# Patient Record
Sex: Female | Born: 1955
Health system: Southern US, Community
[De-identification: ages and names within clinical notes are randomized; demographics above are authoritative.]

## PROBLEM LIST (undated history)

## (undated) DIAGNOSIS — J988 Other specified respiratory disorders: Secondary | ICD-10-CM

## (undated) DIAGNOSIS — R0602 Shortness of breath: Secondary | ICD-10-CM

## (undated) DIAGNOSIS — Z9289 Personal history of other medical treatment: Secondary | ICD-10-CM

## (undated) DIAGNOSIS — IMO0001 Reserved for inherently not codable concepts without codable children: Secondary | ICD-10-CM

## (undated) HISTORY — DX: Reserved for inherently not codable concepts without codable children: IMO0001

## (undated) HISTORY — DX: Personal history of other medical treatment: Z92.89

## (undated) HISTORY — DX: Other specified respiratory disorders: J98.8

---
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
## 1999-02-22 ENCOUNTER — Other Ambulatory Visit: Admission: RE | Admit: 1999-02-22 | Discharge: 1999-02-22 | Payer: Self-pay | Admitting: *Deleted

## 1999-06-27 ENCOUNTER — Encounter: Admission: RE | Admit: 1999-06-27 | Discharge: 1999-08-14 | Payer: Self-pay | Admitting: Orthopedic Surgery

## 2001-05-28 HISTORY — PX: KNEE ARTHROSCOPY: SUR90

## 2001-09-04 ENCOUNTER — Other Ambulatory Visit: Admission: RE | Admit: 2001-09-04 | Discharge: 2001-09-04 | Payer: Self-pay | Admitting: *Deleted

## 2002-02-02 ENCOUNTER — Ambulatory Visit (HOSPITAL_BASED_OUTPATIENT_CLINIC_OR_DEPARTMENT_OTHER): Admission: RE | Admit: 2002-02-02 | Discharge: 2002-02-03 | Payer: Self-pay | Admitting: Orthopedic Surgery

## 2002-07-22 ENCOUNTER — Encounter: Admission: RE | Admit: 2002-07-22 | Discharge: 2002-07-22 | Payer: Self-pay | Admitting: Internal Medicine

## 2002-07-22 ENCOUNTER — Encounter: Payer: Self-pay | Admitting: Internal Medicine

## 2003-08-30 ENCOUNTER — Other Ambulatory Visit: Admission: RE | Admit: 2003-08-30 | Discharge: 2003-08-30 | Payer: Self-pay | Admitting: *Deleted

## 2004-05-28 HISTORY — PX: OTHER SURGICAL HISTORY: SHX169

## 2005-03-02 ENCOUNTER — Ambulatory Visit: Payer: Self-pay | Admitting: Internal Medicine

## 2005-03-09 ENCOUNTER — Ambulatory Visit: Payer: Self-pay

## 2005-03-15 ENCOUNTER — Ambulatory Visit: Payer: Self-pay | Admitting: Internal Medicine

## 2005-04-26 ENCOUNTER — Encounter: Admission: RE | Admit: 2005-04-26 | Discharge: 2005-04-26 | Payer: Self-pay | Admitting: *Deleted

## 2006-03-04 ENCOUNTER — Encounter: Admission: RE | Admit: 2006-03-04 | Discharge: 2006-03-04 | Payer: Self-pay | Admitting: Internal Medicine

## 2006-03-04 ENCOUNTER — Ambulatory Visit: Payer: Self-pay | Admitting: Internal Medicine

## 2006-04-11 ENCOUNTER — Ambulatory Visit: Payer: Self-pay | Admitting: Internal Medicine

## 2006-04-11 LAB — CONVERTED CEMR LAB
ALT: 23 units/L (ref 0–40)
AST: 25 units/L (ref 0–37)
Albumin: 4 g/dL (ref 3.5–5.2)
Alkaline Phosphatase: 54 units/L (ref 39–117)
BUN: 13 mg/dL (ref 6–23)
Basophils Absolute: 0.1 10*3/uL (ref 0.0–0.1)
Basophils Relative: 1 % (ref 0.0–1.0)
Bilirubin, Direct: 0.1 mg/dL (ref 0.0–0.3)
CO2: 31 meq/L (ref 19–32)
Calcium: 9.9 mg/dL (ref 8.4–10.5)
Chloride: 105 meq/L (ref 96–112)
Creatinine, Ser: 0.8 mg/dL (ref 0.4–1.2)
Eosinophil percent: 5.9 % — ABNORMAL HIGH (ref 0.0–5.0)
Free T4: 0.8 ng/dL — ABNORMAL LOW (ref 0.9–1.8)
GFR calc non Af Amer: 81 mL/min
Glomerular Filtration Rate, Af Am: 98 mL/min/{1.73_m2}
Glucose, Bld: 77 mg/dL (ref 70–99)
HCT: 41.1 % (ref 36.0–46.0)
Hemoglobin: 13.9 g/dL (ref 12.0–15.0)
Lymphocytes Relative: 33 % (ref 12.0–46.0)
MCHC: 33.7 g/dL (ref 30.0–36.0)
MCV: 92.3 fL (ref 78.0–100.0)
Monocytes Absolute: 0.5 10*3/uL (ref 0.2–0.7)
Monocytes Relative: 8.3 % (ref 3.0–11.0)
Neutro Abs: 2.9 10*3/uL (ref 1.4–7.7)
Neutrophils Relative %: 51.8 % (ref 43.0–77.0)
Platelets: 262 10*3/uL (ref 150–400)
Potassium: 4.3 meq/L (ref 3.5–5.1)
RBC: 4.46 M/uL (ref 3.87–5.11)
RDW: 13 % (ref 11.5–14.6)
Sodium: 144 meq/L (ref 135–145)
T3, Free: 3 pg/mL (ref 2.3–4.2)
TSH: 1.21 microintl units/mL (ref 0.35–5.50)
Total Bilirubin: 0.4 mg/dL (ref 0.3–1.2)
Total Protein: 7 g/dL (ref 6.0–8.3)
WBC: 5.7 10*3/uL (ref 4.5–10.5)

## 2006-05-09 ENCOUNTER — Ambulatory Visit: Payer: Self-pay | Admitting: Internal Medicine

## 2006-05-22 ENCOUNTER — Encounter: Admission: RE | Admit: 2006-05-22 | Discharge: 2006-05-22 | Payer: Self-pay | Admitting: Internal Medicine

## 2006-06-14 ENCOUNTER — Ambulatory Visit: Payer: Self-pay | Admitting: Internal Medicine

## 2006-06-14 LAB — CONVERTED CEMR LAB
ALT: 22 units/L (ref 0–40)
AST: 31 units/L (ref 0–37)
Basophils Relative: 1 % (ref 0.0–1.0)
Calcium: 9.5 mg/dL (ref 8.4–10.5)
Chloride: 109 meq/L (ref 96–112)
Cholesterol: 191 mg/dL (ref 0–200)
Creatinine, Ser: 0.8 mg/dL (ref 0.4–1.2)
Eosinophils Relative: 6.9 % — ABNORMAL HIGH (ref 0.0–5.0)
Glucose, Bld: 90 mg/dL (ref 70–99)
HCT: 41.7 % (ref 36.0–46.0)
HDL: 85.7 mg/dL (ref >39.0)
LDL Cholesterol: 96 mg/dL (ref 0–99)
Lymphocytes Relative: 34.7 % (ref 12.0–46.0)
MCHC: 32.9 g/dL (ref 30.0–36.0)
MCV: 91.9 fL (ref 78.0–100.0)
Monocytes Absolute: 0.4 10*3/uL (ref 0.2–0.7)
TSH: 1.17 microintl units/mL (ref 0.35–5.50)
Triglycerides: 49 mg/dL (ref 0–149)
WBC: 4.5 10*3/uL (ref 4.5–10.5)

## 2006-06-25 ENCOUNTER — Ambulatory Visit: Payer: Self-pay | Admitting: Internal Medicine

## 2006-07-11 ENCOUNTER — Ambulatory Visit: Payer: Self-pay | Admitting: Internal Medicine

## 2006-07-25 ENCOUNTER — Ambulatory Visit: Payer: Self-pay | Admitting: Internal Medicine

## 2006-08-07 ENCOUNTER — Ambulatory Visit: Payer: Self-pay | Admitting: Internal Medicine

## 2007-08-01 DIAGNOSIS — J069 Acute upper respiratory infection, unspecified: Secondary | ICD-10-CM | POA: Insufficient documentation

## 2007-08-06 DIAGNOSIS — L500 Allergic urticaria: Secondary | ICD-10-CM

## 2007-08-06 HISTORY — DX: Allergic urticaria: L50.0

## 2007-08-07 ENCOUNTER — Ambulatory Visit: Payer: Self-pay | Admitting: Family Medicine

## 2007-08-20 ENCOUNTER — Encounter: Admission: RE | Admit: 2007-08-20 | Discharge: 2007-08-20 | Payer: Self-pay | Admitting: Obstetrics and Gynecology

## 2007-10-03 ENCOUNTER — Telehealth: Payer: Self-pay | Admitting: Internal Medicine

## 2007-10-04 ENCOUNTER — Ambulatory Visit: Payer: Self-pay | Admitting: Family Medicine

## 2007-10-10 ENCOUNTER — Encounter: Admission: RE | Admit: 2007-10-10 | Discharge: 2007-11-14 | Payer: Self-pay | Admitting: Orthopaedic Surgery

## 2007-10-13 ENCOUNTER — Ambulatory Visit: Payer: Self-pay | Admitting: Internal Medicine

## 2007-10-13 ENCOUNTER — Telehealth: Payer: Self-pay | Admitting: *Deleted

## 2007-10-13 DIAGNOSIS — R42 Dizziness and giddiness: Secondary | ICD-10-CM | POA: Insufficient documentation

## 2007-10-13 DIAGNOSIS — M546 Pain in thoracic spine: Secondary | ICD-10-CM | POA: Insufficient documentation

## 2007-10-13 LAB — CONVERTED CEMR LAB
Ketones, urine, test strip: NEGATIVE
Nitrite: NEGATIVE
Protein, U semiquant: NEGATIVE
Urobilinogen, UA: 0.2

## 2007-10-17 LAB — CONVERTED CEMR LAB
ALT: 19 units/L (ref 0–35)
AST: 25 units/L (ref 0–37)
Albumin: 4.1 g/dL (ref 3.5–5.2)
BUN: 17 mg/dL (ref 6–23)
Basophils Absolute: 0 10*3/uL (ref 0.0–0.1)
Basophils Relative: 0.3 % (ref 0.0–1.0)
CO2: 30 meq/L (ref 19–32)
Calcium: 9.5 mg/dL (ref 8.4–10.5)
Chloride: 110 meq/L (ref 96–112)
Creatinine, Ser: 0.6 mg/dL (ref 0.4–1.2)
Eosinophils Absolute: 0.3 10*3/uL (ref 0.0–0.7)
Eosinophils Relative: 4.6 % (ref 0.0–5.0)
Free T4: 1 ng/dL (ref 0.6–1.6)
HDL: 74.8 mg/dL (ref >39.0)
LDL Cholesterol: 103 mg/dL — ABNORMAL HIGH (ref 0–99)
Lymphocytes Relative: 25.6 % (ref 12.0–46.0)
MCHC: 33.8 g/dL (ref 30.0–36.0)
MCV: 91.2 fL (ref 78.0–100.0)
Neutro Abs: 3.7 10*3/uL (ref 1.4–7.7)
Neutrophils Relative %: 65.1 % (ref 43.0–77.0)
TSH: 0.61 microintl units/mL (ref 0.35–5.50)
Total Bilirubin: 0.7 mg/dL (ref 0.3–1.2)
Total CHOL/HDL Ratio: 2.5
Triglycerides: 50 mg/dL (ref 0–149)
VLDL: 10 mg/dL (ref 0–40)

## 2008-01-07 ENCOUNTER — Encounter: Payer: Self-pay | Admitting: Internal Medicine

## 2008-02-25 ENCOUNTER — Encounter: Payer: Self-pay | Admitting: Internal Medicine

## 2008-03-08 ENCOUNTER — Encounter: Payer: Self-pay | Admitting: Internal Medicine

## 2008-05-26 ENCOUNTER — Ambulatory Visit: Payer: Self-pay | Admitting: Internal Medicine

## 2008-05-26 DIAGNOSIS — H9190 Unspecified hearing loss, unspecified ear: Secondary | ICD-10-CM | POA: Insufficient documentation

## 2008-05-28 HISTORY — PX: DOPPLER ECHOCARDIOGRAPHY: SHX263

## 2008-06-14 ENCOUNTER — Encounter: Payer: Self-pay | Admitting: Internal Medicine

## 2008-08-02 ENCOUNTER — Encounter: Payer: Self-pay | Admitting: Internal Medicine

## 2008-09-17 ENCOUNTER — Ambulatory Visit: Payer: Self-pay | Admitting: Family Medicine

## 2008-09-17 DIAGNOSIS — L02419 Cutaneous abscess of limb, unspecified: Secondary | ICD-10-CM | POA: Insufficient documentation

## 2008-09-17 DIAGNOSIS — I803 Phlebitis and thrombophlebitis of lower extremities, unspecified: Secondary | ICD-10-CM

## 2008-09-17 DIAGNOSIS — L03119 Cellulitis of unspecified part of limb: Secondary | ICD-10-CM

## 2008-09-17 HISTORY — DX: Phlebitis and thrombophlebitis of lower extremities, unspecified: I80.3

## 2008-09-22 ENCOUNTER — Ambulatory Visit: Payer: Self-pay

## 2008-09-22 ENCOUNTER — Encounter: Payer: Self-pay | Admitting: Internal Medicine

## 2008-10-06 ENCOUNTER — Encounter: Payer: Self-pay | Admitting: Internal Medicine

## 2008-11-01 ENCOUNTER — Encounter: Admission: RE | Admit: 2008-11-01 | Discharge: 2008-11-01 | Payer: Self-pay | Admitting: Obstetrics and Gynecology

## 2009-02-11 ENCOUNTER — Ambulatory Visit: Payer: Self-pay | Admitting: Internal Medicine

## 2009-02-11 LAB — CONVERTED CEMR LAB
ALT: 22 units/L (ref 0–35)
Albumin: 4 g/dL (ref 3.5–5.2)
Basophils Relative: 1.1 % (ref 0.0–3.0)
Bilirubin, Direct: 0 mg/dL (ref 0.0–0.3)
CO2: 32 meq/L (ref 19–32)
Chloride: 108 meq/L (ref 96–112)
Creatinine, Ser: 0.7 mg/dL (ref 0.4–1.2)
Direct LDL: 177.2 mg/dL
Eosinophils Relative: 6.9 % — ABNORMAL HIGH (ref 0.0–5.0)
HCT: 40.4 % (ref 36.0–46.0)
Hemoglobin: 13.8 g/dL (ref 12.0–15.0)
MCV: 90.6 fL (ref 78.0–100.0)
Monocytes Absolute: 0.4 10*3/uL (ref 0.1–1.0)
Neutro Abs: 2.3 10*3/uL (ref 1.4–7.7)
Neutrophils Relative %: 50 % (ref 43.0–77.0)
Nitrite: NEGATIVE
Potassium: 4.4 meq/L (ref 3.5–5.1)
Protein, U semiquant: NEGATIVE
RBC: 4.46 M/uL (ref 3.87–5.11)
Sodium: 144 meq/L (ref 135–145)
Total CHOL/HDL Ratio: 3
Total Protein: 7.3 g/dL (ref 6.0–8.3)
Urobilinogen, UA: 0.2
VLDL: 7.4 mg/dL (ref 0.0–40.0)
WBC Urine, dipstick: NEGATIVE
WBC: 4.7 10*3/uL (ref 4.5–10.5)

## 2009-02-18 ENCOUNTER — Ambulatory Visit: Payer: Self-pay | Admitting: Internal Medicine

## 2009-02-18 DIAGNOSIS — M25569 Pain in unspecified knee: Secondary | ICD-10-CM | POA: Insufficient documentation

## 2009-02-18 DIAGNOSIS — H8109 Meniere's disease, unspecified ear: Secondary | ICD-10-CM | POA: Insufficient documentation

## 2009-02-22 ENCOUNTER — Ambulatory Visit: Payer: Self-pay | Admitting: Sports Medicine

## 2009-02-22 DIAGNOSIS — M25559 Pain in unspecified hip: Secondary | ICD-10-CM | POA: Insufficient documentation

## 2009-02-22 DIAGNOSIS — M7750 Other enthesopathy of unspecified foot: Secondary | ICD-10-CM | POA: Insufficient documentation

## 2009-02-22 DIAGNOSIS — IMO0002 Reserved for concepts with insufficient information to code with codable children: Secondary | ICD-10-CM | POA: Insufficient documentation

## 2009-03-23 ENCOUNTER — Ambulatory Visit: Payer: Self-pay | Admitting: Internal Medicine

## 2009-03-24 LAB — CONVERTED CEMR LAB
Direct LDL: 112.8 mg/dL
HDL: 85.6 mg/dL (ref >39.00)
VLDL: 9.8 mg/dL (ref 0.0–40.0)

## 2009-04-26 ENCOUNTER — Ambulatory Visit: Payer: Self-pay | Admitting: Internal Medicine

## 2009-04-26 DIAGNOSIS — L089 Local infection of the skin and subcutaneous tissue, unspecified: Secondary | ICD-10-CM | POA: Insufficient documentation

## 2009-05-02 ENCOUNTER — Telehealth: Payer: Self-pay | Admitting: Internal Medicine

## 2009-08-05 ENCOUNTER — Telehealth: Payer: Self-pay | Admitting: *Deleted

## 2009-08-08 ENCOUNTER — Ambulatory Visit: Payer: Self-pay | Admitting: Internal Medicine

## 2009-08-08 DIAGNOSIS — S8010XA Contusion of unspecified lower leg, initial encounter: Secondary | ICD-10-CM | POA: Insufficient documentation

## 2009-11-08 ENCOUNTER — Encounter: Admission: RE | Admit: 2009-11-08 | Discharge: 2009-11-08 | Payer: Self-pay | Admitting: Obstetrics and Gynecology

## 2010-02-17 ENCOUNTER — Encounter: Payer: Self-pay | Admitting: Family Medicine

## 2010-02-17 ENCOUNTER — Ambulatory Visit: Payer: Self-pay

## 2010-02-17 ENCOUNTER — Ambulatory Visit: Payer: Self-pay | Admitting: Family Medicine

## 2010-02-17 DIAGNOSIS — R0602 Shortness of breath: Secondary | ICD-10-CM | POA: Insufficient documentation

## 2010-02-17 DIAGNOSIS — R5383 Other fatigue: Secondary | ICD-10-CM

## 2010-02-17 DIAGNOSIS — R5381 Other malaise: Secondary | ICD-10-CM | POA: Insufficient documentation

## 2010-02-17 DIAGNOSIS — M79609 Pain in unspecified limb: Secondary | ICD-10-CM | POA: Insufficient documentation

## 2010-02-21 ENCOUNTER — Telehealth (INDEPENDENT_AMBULATORY_CARE_PROVIDER_SITE_OTHER): Payer: Self-pay | Admitting: *Deleted

## 2010-02-21 ENCOUNTER — Ambulatory Visit: Payer: Self-pay | Admitting: Family Medicine

## 2010-02-22 ENCOUNTER — Ambulatory Visit: Payer: Self-pay | Admitting: Cardiology

## 2010-02-22 DIAGNOSIS — J984 Other disorders of lung: Secondary | ICD-10-CM | POA: Insufficient documentation

## 2010-02-24 ENCOUNTER — Encounter: Payer: Self-pay | Admitting: Critical Care Medicine

## 2010-02-24 ENCOUNTER — Ambulatory Visit: Payer: Self-pay | Admitting: Critical Care Medicine

## 2010-02-25 DIAGNOSIS — J988 Other specified respiratory disorders: Secondary | ICD-10-CM

## 2010-02-25 HISTORY — DX: Other specified respiratory disorders: J98.8

## 2010-02-25 HISTORY — PX: BRONCHOSCOPY: SUR163

## 2010-02-28 ENCOUNTER — Ambulatory Visit: Payer: Self-pay | Admitting: Critical Care Medicine

## 2010-02-28 ENCOUNTER — Ambulatory Visit: Admission: RE | Admit: 2010-02-28 | Discharge: 2010-02-28 | Payer: Self-pay | Admitting: Critical Care Medicine

## 2010-03-02 ENCOUNTER — Telehealth: Payer: Self-pay | Admitting: Internal Medicine

## 2010-03-02 ENCOUNTER — Telehealth: Payer: Self-pay | Admitting: Critical Care Medicine

## 2010-03-06 ENCOUNTER — Ambulatory Visit: Admission: RE | Admit: 2010-03-06 | Discharge: 2010-03-06 | Payer: Self-pay | Admitting: Critical Care Medicine

## 2010-03-06 ENCOUNTER — Telehealth: Payer: Self-pay | Admitting: Internal Medicine

## 2010-03-06 ENCOUNTER — Encounter: Payer: Self-pay | Admitting: Critical Care Medicine

## 2010-03-08 ENCOUNTER — Encounter: Payer: Self-pay | Admitting: Internal Medicine

## 2010-03-09 ENCOUNTER — Ambulatory Visit: Payer: Self-pay | Admitting: Critical Care Medicine

## 2010-03-10 ENCOUNTER — Encounter: Payer: Self-pay | Admitting: Critical Care Medicine

## 2010-03-12 DIAGNOSIS — Q321 Other congenital malformations of trachea: Secondary | ICD-10-CM | POA: Insufficient documentation

## 2010-03-23 ENCOUNTER — Encounter: Payer: Self-pay | Admitting: Critical Care Medicine

## 2010-03-24 ENCOUNTER — Ambulatory Visit: Payer: Self-pay | Admitting: Internal Medicine

## 2010-03-24 ENCOUNTER — Encounter: Payer: Self-pay | Admitting: Internal Medicine

## 2010-03-24 ENCOUNTER — Ambulatory Visit: Payer: Self-pay

## 2010-03-24 ENCOUNTER — Ambulatory Visit (HOSPITAL_COMMUNITY): Admission: RE | Admit: 2010-03-24 | Discharge: 2010-03-24 | Payer: Self-pay | Admitting: Internal Medicine

## 2010-03-24 LAB — CONVERTED CEMR LAB: Pro B Natriuretic peptide (BNP): 58.8 pg/mL (ref 0.0–100.0)

## 2010-03-28 ENCOUNTER — Telehealth (INDEPENDENT_AMBULATORY_CARE_PROVIDER_SITE_OTHER): Payer: Self-pay

## 2010-03-29 ENCOUNTER — Ambulatory Visit: Payer: Self-pay

## 2010-03-29 ENCOUNTER — Ambulatory Visit: Payer: Self-pay | Admitting: Cardiology

## 2010-03-29 ENCOUNTER — Ambulatory Visit (HOSPITAL_COMMUNITY): Admission: RE | Admit: 2010-03-29 | Discharge: 2010-03-29 | Payer: Self-pay | Admitting: Internal Medicine

## 2010-03-29 ENCOUNTER — Encounter: Payer: Self-pay | Admitting: Internal Medicine

## 2010-03-30 ENCOUNTER — Telehealth: Payer: Self-pay | Admitting: Internal Medicine

## 2010-04-12 ENCOUNTER — Ambulatory Visit: Payer: Self-pay | Admitting: Internal Medicine

## 2010-04-12 ENCOUNTER — Ambulatory Visit (HOSPITAL_COMMUNITY): Admission: RE | Admit: 2010-04-12 | Discharge: 2010-04-12 | Payer: Self-pay | Admitting: Internal Medicine

## 2010-04-24 ENCOUNTER — Encounter: Payer: Self-pay | Admitting: Internal Medicine

## 2010-04-28 ENCOUNTER — Telehealth: Payer: Self-pay | Admitting: *Deleted

## 2010-05-03 ENCOUNTER — Ambulatory Visit: Payer: Self-pay | Admitting: Internal Medicine

## 2010-05-08 ENCOUNTER — Ambulatory Visit: Payer: Self-pay | Admitting: Internal Medicine

## 2010-05-08 DIAGNOSIS — J019 Acute sinusitis, unspecified: Secondary | ICD-10-CM | POA: Insufficient documentation

## 2010-05-09 ENCOUNTER — Telehealth: Payer: Self-pay | Admitting: Internal Medicine

## 2010-06-08 ENCOUNTER — Encounter: Payer: Self-pay | Admitting: Internal Medicine

## 2010-06-08 ENCOUNTER — Ambulatory Visit
Admission: RE | Admit: 2010-06-08 | Discharge: 2010-06-08 | Payer: Self-pay | Source: Home / Self Care | Attending: Internal Medicine | Admitting: Internal Medicine

## 2010-06-25 LAB — CONVERTED CEMR LAB
ALT: 23 units/L (ref 0–35)
AST: 25 units/L (ref 0–37)
Alkaline Phosphatase: 70 units/L (ref 39–117)
Bilirubin, Direct: 0 mg/dL (ref 0.0–0.3)
Creatinine, Ser: 0.6 mg/dL (ref 0.4–1.2)
Eosinophils Relative: 4.2 % (ref 0.0–5.0)
GFR calc non Af Amer: 106.3 mL/min (ref >60)
Glucose, Bld: 91 mg/dL (ref 70–99)
Lymphocytes Relative: 29 % (ref 12.0–46.0)
Monocytes Relative: 8.7 % (ref 3.0–12.0)
Neutrophils Relative %: 57.3 % (ref 43.0–77.0)
Platelets: 257 10*3/uL (ref 150.0–400.0)
Sodium: 140 meq/L (ref 135–145)
Total Bilirubin: 0.3 mg/dL (ref 0.3–1.2)
WBC: 6.5 10*3/uL (ref 4.5–10.5)

## 2010-06-27 NOTE — Miscellaneous (Signed)
Summary: Orders Update  Clinical Lists Changes  Orders: Added new Test order of Venous Duplex Lower Extremity (Venous Duplex Lower) - Signed  Appended Document: Orders Update Pt informed   Clinical Lists Changes  Orders: Added new Test order of T-2 View CXR ([id number]) - Signed

## 2010-06-27 NOTE — Progress Notes (Signed)
Summary: test results  Phone Note Call from Patient Call back at [phone]   Caller: Patient----voice mail Reason for Call: Lab or Test Results Summary of Call: requesting chest xray results. Initial call taken by: Warnell Forester,  February 21, 2010 1:35 PM  Follow-up for Phone Call        Patient informed Follow-up by: Josph Macho RMA,  February 21, 2010 2:32 PM

## 2010-06-27 NOTE — Assessment & Plan Note (Signed)
Summary: knot on leg/ssc   Vital Signs:  Patient profile:   55 year old female Menstrual status:  postmenopausal Weight:      157 pounds Pulse rate:   72 / minute BP sitting:   100 / 70  (right arm) Cuff size:   regular  Vitals Entered By: Romualdo Bolk, CMA Duncan Dull) (August 08, 2009 8:35 AM) CC: Bruise with knot still in rt leg since April 2010.   History of Present Illness: Sierra [name]   comes in today for  right leg area that is still discolored a bit and palpable since and injury ( direct contusion ) last April.   after a month of injury she saw colleague who rx for infection did a doppler test in leg  which was negative . Since that time it is not worse but can still see dicoloratino and not really tender. No swelling of leg but is going on transatlantic flight  and has concerns about risk for DVT.   NO hx of dvt and not on hormones. No walking difficulty    Preventive Screening-Counseling & Management  Alcohol-Tobacco     Alcohol drinks/day: <1     Alcohol type: wine     Smoking Status: never  Caffeine-Diet-Exercise     Caffeine use/day: 1-2     Does Patient Exercise: yes  Current Medications (verified): 1)  Triamterene-Hctz 37.5-25 Mg Caps (Triamterene-Hctz) .... Qam 2)  Multivitamins   Tabs (Multiple Vitamin) 3)  Vitamin D 1000 Unit  Tabs (Cholecalciferol) 4)  Voltaren 1 % Gel (Diclofenac Sodium) .Marland Kitchen.. 1 Gram Qid  Allergies (verified): 1)  ! Morphine  Past History:  Past medical, surgical, family and social histories (including risk factors) reviewed for relevance to current acute and chronic problems.  Past Medical History: see paper chart and had ekgs and eval in past some breathing chest symptom  G2P3 Stress myoview neg 2006 Doppler right leg 2010 Neg Consults Dr. Teressa Senter Dr. Rosalio Macadamia   Past Surgical History: Reviewed history from 02/18/2009 and no changes required. colonoscopy-08/07/2006 skin graft 1963  Past History:  Care  Management: Gynecology: Rosalio Macadamia Gastroenterology: Juanda Chance ENT: Jac Canavan Orthopedics: Darrick Penna, [place]  Family History: Reviewed history from 04/26/2009 and no changes required. Family History Diabetes 1st degree relative Family History High cholesterol Family History Uterine cancer mom and sis 93  Fam hx MS sister  Father: high cholesterol, macular degeneration Mother:  uterine cancer Siblings: MS Sister, uterine ca-sister    Social History: Reviewed history from 04/26/2009 and no changes required. Married Never Smoked Alcohol use-yes Drug use-no Regular exercise-yes Occupation:lawyer hho 2     3 children     Review of Systems       no cp sob ar gaiat change .  having a hard time losing weight.  Physical Exam  General:  alert, well-developed, well-nourished, well-hydrated, normal appearance, and healthy-appearing.   Pulses:  pulses intact without delay   Extremities:  right LE  with hyperpigmented area with diffuse border right lower post calf  . some palpable thickness no mass and non tender and no streaking or warmth .   no clubbing cyanosis or edema  Neurologic:  alert & oriented X3, strength normal in all extremities, and gait normal.   Skin:  turgor normal, color normal, no petechiae, no purpura, and no ulcerations.   see leg exam   Impression & Recommendations:  Problem # 1:  Hx of CONTUSION OF LOWER LEG (ICD-924.10) residual changes form [place] hematoma . review  of record  shows no evidence of dvt  and her risk is not elevated for this by hx.  reviewed prevention with long airflights .    Complete Medication List: 1)  Triamterene-hctz 37.5-25 Mg Caps (Triamterene-hctz) .... Qam 2)  Multivitamins Tabs (Multiple vitamin) 3)  Vitamin D 1000 Unit Tabs (Cholecalciferol) 4)  Voltaren 1 % Gel (Diclofenac sodium) .Marland Kitchen.. 1 gram qid  Patient Instructions: 1)  elvevation walking and hydration for dvt prevenetion.

## 2010-06-27 NOTE — Miscellaneous (Signed)
Summary: PFTs   Pulmonary Function Test Date: 03/06/2010 Height (in.): 65 Gender: Female  Pre-Spirometry FVC    Value: 2.89 L/min   Pred: 3.56 L/min     % Pred: 81 % FEV1    Value: 2.28 L     Pred: 2.79 L     % Pred: 81 % FEV1/FVC  Value: 79 %     Pred: 79 %     % Pred: 99 % FEF 25-75  Value: 2.16 L/min   Pred: 2.62 L/min     % Pred: 82 %  Post-Spirometry FVC    Value: 2.98 L/min   Pred: 3.56 L/min     % Pred: 83 % FEV1    Value: 2.54 L     Pred: 2.79 L     % Pred: 91 % FEV1/FVC  Value: 85 %     Pred: 79 %     % Pred: 108 % FEF 25-75  Value: 3.50 L/min   Pred: 2.62 L/min     % Pred: 133 %  Lung Volumes TLC    Value: 4.71 L   % Pred: 90 % RV    Value: 1.76 L   % Pred: 91 % DLCO    Value: 22.52 %   % Pred: 87 % DLCO/VA  Value: 5.15 %   % Pred: 104 %  Evaluation: normal Clinical Lists Changes  Observations: Added new observation of PFT RSLT: normal (03/10/2010 15:34) Added new observation of DLCO/VA%EXP: 104 % (03/10/2010 15:34) Added new observation of DLCO/VA: 5.15 % (03/10/2010 15:34) Added new observation of DLCO % EXPEC: 87 % (03/10/2010 15:34) Added new observation of DLCO: 22.52 % (03/10/2010 15:34) Added new observation of RV % EXPECT: 91 % (03/10/2010 15:34) Added new observation of RV: 1.76 L (03/10/2010 15:34) Added new observation of TLC % EXPECT: 90 % (03/10/2010 15:34) Added new observation of TLC: 4.71 L (03/10/2010 15:34) Added new observation of FEF2575%EXPS: 133 % (03/10/2010 15:34) Added new observation of PSTFEF25/75P: 2.62  (03/10/2010 15:34) Added new observation of PSTFEF25/75%: 3.50 L/min (03/10/2010 15:34) Added new observation of PSTFEV1/FCV%: 108 % (03/10/2010 15:34) Added new observation of FEV1FVCPRDPS: 79 % (03/10/2010 15:34) Added new observation of PSTFEV1/FVC: 85 % (03/10/2010 15:34) Added new observation of POSTFEV1%PRD: 91 % (03/10/2010 15:34) Added new observation of FEV1PRDPST: 2.79 L (03/10/2010 15:34) Added new observation of POST  FEV1: 2.54 L/min (03/10/2010 15:34) Added new observation of POST FVC%EXP: 83 % (03/10/2010 15:34) Added new observation of FVCPRDPST: 3.56 L/min (03/10/2010 15:34) Added new observation of FVC PREDICT: 3.56 L (03/10/2010 15:34) Added new observation of POST FVC: 2.98 L (03/10/2010 15:34) Added new observation of FEF % EXPEC: 82 % (03/10/2010 15:34) Added new observation of FEF25-75%PRE: 2.62 L/min (03/10/2010 15:34) Added new observation of FEF 25-75%: 2.16 L/min (03/10/2010 15:34) Added new observation of FEV1/FVC%EXP: 99 % (03/10/2010 15:34) Added new observation of FEV1/FVC PRE: 79 % (03/10/2010 15:34) Added new observation of FEV1/FVC: 79 % (03/10/2010 15:34) Added new observation of FEV1 % EXP: 81 % (03/10/2010 15:34) Added new observation of FEV1 PREDICT: 2.79 L (03/10/2010 15:34) Added new observation of FEV1: 2.28 L (03/10/2010 15:34) Added new observation of FVC % EXPECT: 81 % (03/10/2010 15:34) Added new observation of FVC: 2.89 L (03/10/2010 15:34) Added new observation of PFT HEIGHT: 65  (03/10/2010 15:34) Added new observation of PFT DATE: 03/06/2010  (03/10/2010 15:34)  Appended Document: PFTs result noted  patient aware

## 2010-06-27 NOTE — Assessment & Plan Note (Signed)
Summary: Pulmonary OV   Primary Provider/Referring Provider:  Dr. Darrell Jewel  CC:  Follow up to discuss bronch results.  .  History of Present Illness: Pulmonary Consultation [phone] WF hx exertional dyspnea developing over the past summer.  10yrs of passive smoking exposure on the job.  Pt then saw PCP, cxr abn and subsequently CT chest with  nodule RUL and tracheal abn.  ? nodules on trachea.  Dopple neg LE.    Environmental History:      Patient reports environmental history significant for the following. environmental history.  Patient lives in old house:[address]  The current dwelling contains damp basement and visible mold-mildew.  Atopic family history reveals asthma.     Pt denies any significant sore throat, nasal congestion or excess secretions, fever, chills, sweats, unintended weight loss, pleurtic or exertional chest pain, orthopnea PND, or leg swelling Pt denies any increase in rescue therapy over baseline, denies waking up needing it or having any early am or nocturnal exacerbations of coughing/wheezing/or dyspnea.   March 09, 2010 12:05 PM no real change in dyspnea bx from fob neg clinical exam of trachea per Fob is consistent with tracheobronchopathica osteochondroplastica ? if related to dyspnea or not PFTs done:  essentially normal.   Current Medications (verified): 1)  Triamterene-Hctz 37.5-25 Mg Caps (Triamterene-Hctz) .... Qam  Allergies (verified): 1)  ! Morphine  Past History:  Past medical, surgical, family and social histories (including risk factors) reviewed, and no changes noted (except as noted below).  Past Medical History: see paper chart and had ekgs and eval in past some breathing chest symptom  G2P3 Stress myoview neg 2006 Menieres dx    -on trimaterene/hctz Tracheobronchopathica Osteochondroplastica    -involvement of trachea per FOB 02/2010 Doppler right leg 2010 Neg Consults Dr. Teressa Senter Dr. Rosalio Macadamia   Past Surgical History: Reviewed history  from 02/18/2009 and no changes required. colonoscopy-08/07/2006 skin graft 1963  Family History: Reviewed history from 02/24/2010 and no changes required. Family History Diabetes 1st degree relative Family History High cholesterol Family History Uterine cancer mom and sis 96  Fam hx MS sister  Father: high cholesterol, macular degeneration, asthma, allergies, prostate ca Mother:  uterine cancer Siblings: MS Sister, uterine ca-sister    Social History: Reviewed history from 04/26/2009 and no changes required. 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  The patient denies shortness of breath at rest, productive cough, non-productive cough, coughing up blood, chest pain, irregular heartbeats, acid heartburn, indigestion, loss of appetite, weight change, abdominal pain, difficulty swallowing, sore throat, tooth/dental problems, headaches, nasal congestion/difficulty breathing through nose, sneezing, itching, ear ache, anxiety, depression, hand/feet swelling, joint stiffness or pain, rash, change in color of mucus, and fever.    Vital Signs:  Patient profile:   55 year old female Menstrual status:  postmenopausal Height:      65 inches Weight:      153 pounds BMI:     25.55 O2 Sat:      99 % on Room air Temp:     98.1 degrees F oral Pulse rate:   61 / minute BP sitting:   110 / 70  (left arm) Cuff size:   regular  Vitals Entered By: Gweneth Dimitri RN (March 09, 2010 11:44 AM)  O2 Flow:  Room air CC: Follow up to discuss  bronch results.   Comments Medications reviewed with patient Daytime contact number verified with patient. Gweneth Dimitri RN  March 09, 2010 11:44 AM  .Ambulatory Pulse Oximetry  Resting; HR__71   ___    02 Sat__100___ra  Lap1 (185 feet)   HR_84____   02 Sat_99__ra;_up and down 3 flight of  stairs Lap2 (185 feet)   HR125_____   02 Sat__99___ra; up and down 3 flight of stairs Lap3 (185 feet)   HR143_____   02 Sat_97____ra;up and down 3 flight of stairs  __x_Test Completed without Difficulty .Kandice Hams CMA  March 09, 2010 12:36 PM  ___Test Stopped due to:    Physical Exam  Additional Exam:  Gen: Pleasant, well-nourished, in no distress,  normal affect ENT: No lesions,  mouth clear,  oropharynx clear, no postnasal drip Neck: No JVD, no TMG, no carotid bruits Lungs: No use of accessory muscles, no dullness to percussion, clear without rales or rhonchi Cardiovascular: RRR, heart sounds normal, no murmur or gallops, no peripheral edema Abdomen: soft and NT, no HSM,  BS normal Musculoskeletal: No deformities, no cyanosis or clubbing Neuro: alert, non focal Skin: Warm, no lesions or rashes    Impression & Recommendations:  Problem # 1:  OTHER DISEASES OF TRACHEA AND BRONCHUS (ICD-519.19) Assessment Unchanged Tracheobronchopathica Osteochrondroplastica involving trachea only.  Non malignant. Rare condition involving cartilage rings of trachea,  rarely can progress to tracheal stenosis, no specific therapy , incidental finding on FOB/CTscan.  No relation to Upper lobe nodule plan  repeat ct chest in 12 months  Problem # 2:  PULMONARY NODULE, RIGHT UPPER LOBE (ICD-518.89) Assessment: Unchanged rul lung nodule likely benign plan  repeat ct in 12 months  Problem # 3:  SHORTNESS OF BREATH (ICD-786.05) Assessment: Unchanged Unclear pulmonary etiology of dyspnea.  Tracheal nodules not large enough to obstruct airway.  PFTs normal ambulatory pulse ox done up and down stairs today normal plan consider cardiology f/u and echo Orders: Pulse Oximetry, Ambulatory ([id number])  Complete Medication List: 1)  Triamterene-hctz 37.5-25 Mg Caps (Triamterene-hctz) .... Qam

## 2010-06-27 NOTE — Progress Notes (Signed)
Summary: Knot on leg is still there  Phone Note Call from Patient Call back at Work Phone [phone]   Caller: Patient Summary of Call: Pt still has a bruise with a knot to her leg that she is concerned about and it has now been 1 year. Pt states that we done a doppler on it. It was normal. Pt is going to do some international traveling in [facility] April and wants to make sure it's okay. Does she need an appt or what? Initial call taken by: Romualdo Bolk, CMA (AAMA),  August 05, 2009 9:07 AM  Follow-up for Phone Call        i would have to look at it to tell her more.  ? ov  Follow-up by: Madelin Headings MD,  August 05, 2009 5:01 PM  Additional Follow-up for Phone Call Additional follow up Details #1::        Pt aware and appt made. Additional Follow-up by: Romualdo Bolk, CMA (AAMA),  August 05, 2009 5:03 PM

## 2010-06-27 NOTE — Progress Notes (Signed)
Summary: Stress Echo Pre-Procedure  Phone Note Outgoing Call Call back at Work Phone [phone]   Call placed by: Irean Hong, RN,  March 28, 2010 1:59 PM Summary of Call: Left message on instructions for Stress Echo for tomorrow. Patsy Edwards,RN.

## 2010-06-27 NOTE — Assessment & Plan Note (Signed)
Summary: Pulmonary COnsultation   Primary Provider/Referring Provider:  Dr. Darrell Jewel  CC:  PUlmonary Consult.Sierra [name]  History of Present Illness: Pulmonary Consultation [phone] WF hx exertional dyspnea developing over the past summer.  31yrs of passive smoking exposure on the job.  Pt then saw PCP, cxr abn and subsequently CT chest with  nodule RUL and tracheal abn.  ? nodules on trachea.  Dopple neg LE.    Environmental History:      Patient reports environmental history significant for the following. environmental history.  Patient lives in old house:S[place] [name]  The current dwelling contains damp basement and visible mold-mildew.  Atopic family history reveals asthma.     Pt denies any significant sore throat, nasal congestion or excess secretions, fever, chills, sweats, unintended weight loss, pleurtic or exertional chest pain, orthopnea PND, or leg swelling Pt denies any increase in rescue therapy over baseline, denies waking up needing it or having any early am or nocturnal exacerbations of coughing/wheezing/or dyspnea.   Preventive Screening-Counseling & Management  Alcohol-Tobacco     Smoking Status: never  Current Medications (verified): 1)  Triamterene-Hctz 37.5-25 Mg Caps (Triamterene-Hctz) .... Qam 2)  Voltaren 1 % Gel (Diclofenac Sodium) .Sierra [name].. 1 Gram Qid  Allergies (verified): 1)  ! Morphine  Past History:  Past medical, surgical, family and social histories (including risk factors) reviewed, and no changes noted (except as noted below).  Past Medical History: see paper chart and had ekgs and eval in past some breathing chest symptom  G2P3 Stress myoview neg 2006 Menieres dx    -on trimaterene/hctz Doppler right leg 2010 Neg Consults Dr. Teressa Senter Dr. Rosalio Macadamia   Past Surgical History: Reviewed history from 02/18/2009 and no changes required. colonoscopy-08/07/2006 skin graft 1963  Family History: Reviewed history from 04/26/2009 and no changes required. Family History  Diabetes 1st degree relative Family History High cholesterol Family History Uterine cancer mom and sis 52  Fam hx MS sister  Father: high cholesterol, macular degeneration, asthma, allergies, prostate ca Mother:  uterine cancer Siblings: MS Sister, uterine ca-sister    Social History: Reviewed history from 04/26/2009 and no changes required. 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  The patient denies shortness of breath at rest, productive cough, non-productive cough, coughing up blood, chest pain, irregular heartbeats, acid heartburn, indigestion, loss of appetite, weight change, abdominal pain, difficulty swallowing, sore throat, tooth/dental problems, headaches, nasal congestion/difficulty breathing through nose, sneezing, itching, ear ache, anxiety, depression, hand/feet swelling, joint stiffness or pain, rash, change in color of mucus, and fever.    Vital Signs:  Patient profile:   55 year old female Menstrual status:  postmenopausal Height:      65 inches Weight:      156 pounds BMI:     26.05 O2 Sat:      97 % on Room air Temp:     98.7 degrees F oral Pulse rate:   58 / minute BP sitting:   106 / 70  (right arm) Cuff size:   regular  Vitals Entered By: Gweneth Dimitri RN (February 24, 2010 3:47 PM)  O2 Flow:  Room air CC: PUlmonary Consult. Comments Medications reviewed with patient Daytime contact number verified with patient. Gweneth Dimitri RN  February 24, 2010 3:47 PM    Physical Exam  Additional Exam:  Gen: Pleasant, well-nourished,  in no distress,  normal affect ENT: No lesions,  mouth clear,  oropharynx clear, no postnasal drip Neck: No JVD, no TMG, no carotid bruits Lungs: No use of accessory muscles, no dullness to percussion, clear without rales or rhonchi Cardiovascular: RRR, heart sounds  normal, no murmur or gallops, no peripheral edema Abdomen: soft and NT, no HSM,  BS normal Musculoskeletal: No deformities, no cyanosis or clubbing Neuro: alert, non focal Skin: Warm, no lesions or rashes    Impression & Recommendations:  Problem # 1:  PULMONARY NODULE, RIGHT UPPER LOBE (ICD-518.89) Assessment Unchanged Nodule RUL non calcified 6mm. plan observation suspect benign Orders: New Patient Level V (16109)  Problem # 2:  CT, CHEST, ABNORMAL (ICD-793.1) abn nodules on tracheal ?tracheal polyps ?mucus plan FOB  Orders: New Patient Level V (60454)  Problem # 3:  SHORTNESS OF BREATH (ICD-786.05) Assessment: Unchanged unclear dyspnea is related to any of ct findings note normal spirometry Orders: Spirometry w/Graph ([id number])  Complete Medication List: 1)  Triamterene-hctz 37.5-25 Mg Caps (Triamterene-hctz) .... Qam 2)  Voltaren 1 % Gel (Diclofenac sodium) .Sierra [name].. 1 gram qid  Patient Instructions: 1)  A bronchoscopy will be scheduled for 1pm on 02/28/10 2)  Nothing by mouth after 530am that morning 3)  No medication changes 4)  I will call you with confirmation of the time

## 2010-06-27 NOTE — Progress Notes (Signed)
Summary: schedule echo and OV with Dr Gala Romney  ---- Converted from flag ---- ---- 03/02/2010 7:41 PM, Dolores Patty, MD, [facility] wrote: pt needs an echo and appt with me next week on either 10/10 or 10/11 . please schedule. ok to overbook or bring in over lunch (needs echo prior to seeing me)  thanks ------------------------------  Phone Note Outgoing Call   Call placed by: Katina Dung, RN, BSN,  March 06, 2010 1:45 PM Call placed to: Patient Summary of Call: schedule echo and OV with Dr Gala Romney  Follow-up for Phone Call        [facility] Katina Dung, RN, BSN  March 06, 2010 1:46 PM   Additional Follow-up for Phone Call Additional follow up Details #1::        ok to postpone for 1-2 weeks until results of pulmoanry work-up return. Dolores Patty, MD, [facility]  March 06, 2010 4:55 PM

## 2010-06-27 NOTE — Assessment & Plan Note (Signed)
Summary: ? exertion issues//ccm   Vital Signs:  Patient profile:   55 year old female Menstrual status:  postmenopausal Height:      65 inches (165.10 cm) Weight:      154 pounds (70 kg) BMI:     25.72 O2 Sat:      98 % on Room air Temp:     97.8 degrees F (36.56 degrees C) oral Pulse rate:   86 / minute BP sitting:   102 / 80  (left arm) Cuff size:   regular  Vitals Entered By: Josph Macho RMA (February 17, 2010 2:37 PM)  O2 Flow:  Room air CC: Exertion issue- heavy chested, SOB X Is Patient Diabetic? No   History of Present Illness: patient is a 55 year old Caucasian female in today with concerns regarding intermittent SOB over the last 2 weeks. Patient is a Geophysical data processor and will work out multiple times a week 4 and 5 times at her gym. Generally tolerates this wel butl she had an episode 2 weeks ago on the elliptical where she became acutely SOB casuing her to stop her workout. She felt as if her chest was tight and she could not get a deep breath. Upon stopping her symptoms resolved, she has continued to exercise since then but has had a couple of other episodes of SOB she feels are excessive for the level of exertion. She took some stairs yesterday and she ended up short of breath and anxious at the end. She has not had any palpitations. She is denying any recent febrile illness or chills. No congestion, rhinorrhea, HA, sore throat, wheezing, cough, GI or GU complaints. She denies anorexia, N/V, diarrhea, malaise, myalgias. She reports having some episodes of chest pain roughly 4-5 years ago which were evaluated with stress testing. The chest pain  resolved spontaneously and she was ultimately diagnosed with reflux and her stress  testing was negative. She denies any symptoms of reflux at the present time, no dyspepsia, no sore throat, no dysphagia. Of note she has a long-standing injury to her right lower extremity, 8 or 9 years ago she injured her right knee and had an ACL  reconstruction with donor tissue.T hen about a year and a half ago she had a heavy table fall on her right calf for severe trauma with ecchymosis and has been evaluated for DVT but never found to have one. Notably a couple weeks ago she began having more trouble with her knee again she's had a couple of episodes of the knee popping out of joint and then having to put it back in place herself but had not thought of that as causing any acute trauma or pain.  Current Medications (verified): 1)  Triamterene-Hctz 37.5-25 Mg Caps (Triamterene-Hctz) .... Qam 2)  Multivitamins   Tabs (Multiple Vitamin) 3)  Vitamin D 1000 Unit  Tabs (Cholecalciferol) 4)  Voltaren 1 % Gel (Diclofenac Sodium) .Marland Kitchen.. 1 Gram Qid  Allergies (verified): 1)  ! Morphine  Past History:  Past medical history reviewed for relevance to current acute and chronic problems. Social history (including risk factors) reviewed for relevance to current acute and chronic problems.  Past Medical History: Reviewed history from 08/08/2009 and no changes required. see paper chart and had ekgs and eval in past some breathing chest symptom  G2P3 Stress myoview neg 2006 Doppler right leg 2010 Neg Consults Dr. Teressa Senter Dr. Rosalio Macadamia   Social History: Reviewed history from 04/26/2009 and no changes required. 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 No apparent alopecia or balding. Ears:  External ear exam shows no significant lesions or deformities.  Otoscopic examination reveals clear canals, tympanic membranes are intact bilaterally without bulging, retraction, inflammation or discharge. Hearing is grossly normal  bilaterally. Nose:  External nasal examination shows no deformity or inflammation. Nasal mucosa are pink and moist without lesions or exudates. Mouth:  Oral mucosa and oropharynx without lesions or exudates.  Teeth in good repair. Neck:  No deformities, masses, or tenderness noted. Lungs:  Normal respiratory effort, chest expands symmetrically. Lungs are clear to auscultation, no crackles or wheezes. Heart:  Normal rate and regular rhythm. S1 and S2 normal without gallop, murmur, click, rub or other extra sounds. Abdomen:  Bowel sounds positive,abdomen soft and non-tender without masses, organomegaly or hernias noted. Pulses:  R and L carotid,,dorsalis pedis and posterior tibial pulses are full and equal bilaterally Extremities:  ecchymosis noted just above heel in right posterior calf and slightly firm, tender area noted at the caudal aspect of the right calf as well. No redness/fluctuance or warmth noted. Psych:  Cognition and judgment appear intact. Alert and cooperative with normal attention span and concentration. No apparent delusions, illusions, hallucinations   Impression & Recommendations:  Problem # 1:  SHORTNESS OF BREATH (ICD-786.05)  Orders: TLB-CBC Platelet - w/Differential (85025-CBCD) Cardiology Referral (Cardiology) Doppler Referral (Doppler) TLB-Hepatic/Liver Function Pnl (80076-HEPATIC) TLB-TSH (Thyroid Stimulating Hormone) (84443-TSH) TLB-Renal Function Panel (80069-RENAL) TLB-Sedimentation Rate (ESR) (85652-ESR) Venipuncture (04540) Specimen Handling (98119) Have ordered cardiac consultation and will proceed with further work up in this regard if her RLE Doppler is negative. If her symptoms worsen over the weekend she will present to the ER for further evaluation.  Problem # 2:  FATIGUE (ICD-780.79)  Orders: Cardiology Referral (Cardiology)  TSH and CBC ordered today  Problem # 3:  UNSPECIFIED MENIERES DISEASE (ICD-386.00) No recent difficulties unless she  misses a dose of her Dyazide  Complete Medication List: 1)  Triamterene-hctz 37.5-25 Mg Caps (Triamterene-hctz) .... Qam 2)  Multivitamins Tabs (Multiple vitamin) 3)  Vitamin D 1000 Unit Tabs (Cholecalciferol) 4)  Voltaren 1 % Gel (Diclofenac sodium) .Marland Kitchen.. 1 gram qid

## 2010-06-27 NOTE — Progress Notes (Signed)
Summary: Pt cx appt for 03/07/10. Having testing done via Dr. Delford Field  Phone Note Call from Patient   Caller: Patient Summary of Call: Pt cx her appt for 03/07/10 because she is having some other testing done with Dr. Delford Field. Pt says that if Dr Fabian Sharp has questions and needs info, to pls contact Dr Florene Route office.  Initial call taken by: Lucy Antigua,  March 02, 2010 5:02 PM  Follow-up for Phone Call        noted   she had a bronchoscopy. Follow-up by: Madelin Headings MD,  March 03, 2010 9:46 AM

## 2010-06-27 NOTE — Assessment & Plan Note (Signed)
Summary: np6 for sob/sl   Visit Type:  new pt Primary Provider:  Dr. Darrell Jewel  CC:  shortness of breath and dizziness.  History of Present Illness: Sierra [name] is a delightful 55 y/o woman (wife of Dr. Caralyn Guile) who presents for evaluation of exertional dyspnea. She is Interior and spatial designer of [employer].   PMHx notable only for some mild situational HTN in past. No diabetes, hyperlipidemia or known cardiac disease.   Has always been fairly active. Was on/off a bit this summer. About 2-3 monthsago got on elliptical and noticed it was more difficult for her and couldn't go as long. Also noted problems with her breathing when playing tennis and walking up steps. Much worse with carrying objects. Continue to walk with her friends but avoiding hills. No wheezing,  Underwent LE venous Dopplers and CT chest. No DVT or PE. CT notable for small pulmonary and tracheal abnormality. Had bronch showing benign tracheal nodules. PFTs minimal obstruction. Hgb OK.   Also has episodic dizziness and saw Dr. Dorma Russell and diagnosed Menniere's disease and treated with diuretics. Notes that if she misses her diuretic she retains fluid easily. Does have occasional chest heaviness when breathing hard. About 3 years ago had episode of severe chest pressure and had normal treadmill Myoview.  Echo today EF 60% pseudonormal diastolic filling pattern. Triv MR/TR. Normal RV. No evidence PH.   Current Medications (verified): 1)  Triamterene-Hctz 37.5-25 Mg Caps (Triamterene-Hctz) .... Qam  Allergies (verified): 1)  ! Morphine  Past History:  Past Medical History: Last updated: 03/09/2010 see paper chart and had ekgs and eval in past some breathing chest symptom  G2P3 Stress myoview neg 2006 Menieres dx    -on trimaterene/hctz Tracheobronchopathica Osteochondroplastica    -involvement of trachea per FOB 02/2010 Doppler right leg 2010 Neg Consults Dr. Teressa Senter Dr. 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 Family History Diabetes 1st degree relative Family History High cholesterol Family History Uterine cancer mom and sis 38  Fam hx MS sister  Father: high cholesterol, macular degeneration, asthma, allergies, prostate ca Mother:  uterine cancer Siblings: MS Sister, uterine ca-sister    Social History: Reviewed history from 04/26/2009 and no changes required. 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   Vital Signs:  Patient profile:   55 year old female Menstrual status:  postmenopausal Height:      65 inches Weight:      156.50 pounds BMI:     26.14 Pulse rate:   60 / minute BP sitting:   132 / 78  (left arm) Cuff size:   regular  Vitals Entered By: Caralee Ates CMA (  March 24, 2010 9:39 AM)  Physical Exam  General:  Well appearing. no resp difficulty HEENT: normal Neck: supple. JVP 7. Carotids 2+ bilat; no bruits. No lymphadenopathy or thryomegaly appreciated. Cor: PMI nondisplaced. Regular rate & rhythm. No rubs, gallops,  murmur. Lungs: clear Abdomen: soft, nontender, nondistended. No hepatosplenomegaly. No bruits or masses. Good bowel sounds. Extremities: no cyanosis, clubbing, rash. tr edema Neuro: alert & orientedx3, cranial nerves grossly intact. moves all 4 extremities w/o difficulty. affect pleasant     New Orders:     1)  Stress Echo (Stress Echo)  Due: 03/24/2010     2)  CPX Test at [facility] (CPX Test)  Due: 03/24/2010     3)  TLB-BNP (B-Natriuretic Peptide) (83880-BNPR)  Due: 03/24/2010   Impression & Recommendations:  Problem # 1:  SHORTNESS OF BREATH (ICD-786.05) Diificult situation. Based on echo she appears to have a moderate degree of diastolic dysfunction which may be contributing to her dyspnea but I do not see much volume on board. Additionally, the rapidity of onset of her symptoms concerns me for possible ischemia even though her overall cardiac risk factor profile is low. PE also a consideration but CT was negative. No evidence of neuromuscular problems or PAH. We will start wih a stress echo and checking a BNP. I suspect these may be normal so will also schedule a cardiopulmonary stress test to further evaluate causes of her dyspnea between pulmonary vs cardiac vs deconditioning, etc.   Other Orders: Stress Echo (Stress Echo) CPX Test at [facility] (CPX Test) TLB-BNP (B-Natriuretic Peptide) (83880-BNPR)  Patient Instructions: 1)  Lab today 2)  Your physician has requested that you have a stress echocardiogram. For further information please visit [email or web address]  Please follow instruction sheet as given. 3)  Your physician has recommended that you have a cardiopulmonary stress test (CPX).  CPX testing is a non-invasive measurement of heart and lung function. It replaces a traditional treadmill stress test. This type of test provides a tremendous amount of information that relates not only to your present condition but also for future outcomes.  This test  combines measurements of your ventilation, respiratory gas exchange in the lungs, electrocardiogram (EKG), blood pressure and physical response before, during, and following an exercise protocol.

## 2010-06-27 NOTE — Progress Notes (Signed)
Summary: returning call  Phone Note Call from Patient Call back at [phone]   Caller: Spouse//Dr. Dellia Cloud Call For: Delford Field Reason for Call: Talk to Doctor Summary of Call: Dr. Dellia Cloud states his wife was  unavailable when you called, you can reach her on her cell at [phone]. Initial call taken by: Darletta Moll,  March 02, 2010 10:12 AM  Follow-up for Phone Call        done

## 2010-06-27 NOTE — Miscellaneous (Signed)
Summary: Appointment Canceled  Appointment status changed to canceled by [employer] on 03/08/2010 11:26 AM.  Cancellation Comments --------------------- echo dx 786.05/appt. with Dr. Jesusita Oka after echo  Appointment Information ----------------------- Appt Type:  CARDIOLOGY ANCILLARY VISIT      Date:  Thursday, March 23, 2010      Time:  8:30 AM for 60 min   Urgency:  Routine   Made By:  Pearson Grippe  To Visit:  [id number]    Reason:  echo dx 786.05/appt. with Dr. Jesusita Oka after echo  Appt Comments ------------- -- 03/08/10 11:26: ([facility]) CANCELED -- echo dx 786.05/appt. with Dr. Jesusita Oka after echo -- 03/08/10 11:23: ([facility]) BOOKED -- Routine CARDIOLOGY ANCILLARY VISIT at 03/23/2010 8:30 AM for 60 min echo dx 786.05/appt. with Dr. Jesusita Oka after echo

## 2010-06-27 NOTE — Progress Notes (Signed)
Summary: Labs being faxed, FYI  Phone Note Call from Patient Call back at [phone]   Caller: Patient Call For: Madelin Headings MD Summary of Call: PC from pt reporting she had lab work done at Dr Lovey Newcomer office and was told the glucose was very high, diabetic range.  She did not have the numbers, and the lab work was NON-fasting.  Dr Lovey Newcomer office will be faxing labs to Dr Fabian Sharp today, please be on the look-out for the fax and call pt at [phone] with directives. Initial call taken by: Sid Falcon LPN,  April 28, 2010 11:48 AM  Follow-up for Phone Call        the results i saaw of non fasting BG was 124 non fasting wich is not diabetes. however suggest  we get Fasting FBS and HGa1c and then rov to discuss.   Follow-up by: Madelin Headings MD,  April 28, 2010 6:54 PM  Additional Follow-up for Phone Call Additional follow up Details #1::        Left message on machine about above. Additional Follow-up by: Romualdo Bolk, CMA (AAMA),  May 01, 2010 10:33 AM

## 2010-06-27 NOTE — Progress Notes (Signed)
Summary: terst results  Phone Note Call from Patient   Caller: Patient Summary of Call: pt called back for stress echo results which she received, she states that after the test was complete and she left she began to feel bad, very faint and weak felt like she going to pass out.  She went straight home and rested the rest of the day but cont to just feel weak and tired "like something wasn't right" today she reports she feels fine, advised test was ok will send info to Dr Gala Romney for review Meredith Staggers, RN  March 30, 2010 4:05 PM   Follow-up for Phone Call        suspect she may have been volume depleted. will likely need CPX. pls let me know if symptoms return. Dolores Patty, MD, [facility]  March 31, 2010 11:56 AM  pt is sch for CPX on 11/16, have called pt and Left message to call back Meredith Staggers, RN  March 31, 2010 12:34 PM   Additional Follow-up for Phone Call Additional follow up Details #1::        Pt calling back regarding test  [phone] or [phone] Judie Grieve  April 07, 2010 11:15 AM Pt. return heather's call. Pt. states she just got in town. Pt.said is feeling fine now. She is aware that the CPX test on 04/12/10,and  will call back if symtoms return. Additional Follow-up by: Ollen Gross, RN, BSN,  April 07, 2010 12:16 PM

## 2010-06-27 NOTE — Miscellaneous (Signed)
Summary: Orders Update   Clinical Lists Changes  Orders: Added new Service order of Est. Patient Level IV (99214) - Signed 

## 2010-06-29 NOTE — Assessment & Plan Note (Signed)
Summary: rov   Visit Type:  Follow-up Primary Provider:  Dr. Darrell Jewel  CC:  shortness of breath.  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   PMHx notable only for some mild situational HTN in past. No diabetes, hyperlipidemia or known cardiac disease.   Has always been fairly active. Was on/off a bit this summer. Then had abrupt onset of exertional dyspnea and fatigue which she has no recovered from. She notes that she had extensive oral surgery just prior to this.   W/u to date:  1) LE venous Dopplers and CT chest. No DVT or PE. CT notable for 6mm lung nodule and tracheal abnormality. Had bronch showing benign tracheal nodules. PFTs minimal obstruction. Hgb OK.   2) Echo and stress echo 11/11: normal except for grade 2 diastolic dysfx. EF 60%. No PAH. BNP 58.  3) CPX test 11/11 pVO2 27.5 (115% predicted) slope 24 O2 pulse 11 (110%) RER 1.33 VE/MVV 72% - totally normal with no evidence cardiopulmonary limitation to exercise  Becoming more active but still not back to baseline. Notes that when she just walks across off with a box she is very dyspneic.  Was recently in [place] and walked up to 5hrs per day. Felt ok but said that she couldn't keep up with her daughter. Also continues to walk 3-4 miles several days per week and friends comment that she is breathing much harder. (was able to run the trail last year).    Current Medications (verified): 1)  Triamterene-Hctz 37.5-25 Mg Caps (Triamterene-Hctz) .... Once Daily  Allergies (verified): 1)  ! Morphine  Past History:  Past Medical History: Last updated: 03/09/2010 see paper chart and had ekgs and eval in past some breathing chest symptom  G2P3 Stress myoview neg 2006 Menieres dx    -on trimaterene/hctz Tracheobronchopathica Osteochondroplastica    -involvement of trachea per FOB  02/2010 Doppler right leg 2010 Neg Consults Dr. Teressa Senter Dr. Rosalio Macadamia   Review of Systems       As per HPI and past medical history; otherwise all systems negative.   Vital Signs:  Patient profile:   55 year old female Menstrual status:  postmenopausal Height:      65 inches Weight:      151 pounds BMI:     25.22 Pulse rate:   76 / minute BP sitting:   104 / 70  (left arm) Cuff size:   regular  Vitals Entered By: Hardin Negus, RMA (June 08, 2010 2:52 PM)  Physical Exam  General:  Gen: well appearing. no resp difficulty HEENT: normal Neck: supple. no JVD. Carotids 2+ bilat; no bruits. No lymphadenopathy or thryomegaly appreciated. Cor: PMI nondisplaced. Regular rate & rhythm. No rubs, gallops, murmur. Lungs: clear Abdomen: soft, nontender, nondistended. No hepatosplenomegaly. No bruits or masses. Good bowel sounds. Extremities: no cyanosis, clubbing, rash, edema Neuro: alert & orientedx3, cranial nerves grossly intact. moves all 4 extremities w/o difficulty. affect pleasant    Impression & Recommendations:  Problem # 1:  SHORTNESS OF BREATH (ICD-786.05) We had a long talk about her situation. We reviewed all her studies to date and also talked about the possiblity of doing a right and left heart cath as the next step of a diagnostic work-up. Based on her testing I told her that I was fairly reassured and felt there was a very low risk of a serious cardiopulmonary  abnormality at this time. We have decided to defer further work-up at this time and have her continue with her exercise program and progressively try to build her stamina. If she continues to have symptoms can then revisit the possibility of a cath.   Other Orders: EKG w/ Interpretation (93000)  Patient Instructions: 1)  Follow up in 1 month

## 2010-06-29 NOTE — Progress Notes (Signed)
Summary: wants test results  Phone Note Call from Patient   Caller: Patient [phone] Reason for Call: Talk to Nurse Summary of Call: pt calling to get test results-pls call-also wants to know if she needs fu appt Initial call taken by: Glynda Jaeger,  May 09, 2010 3:48 PM  Follow-up for Phone Call        Dr Gala Romney will call pt Sierra Staggers, RN  May 09, 2010 6:04 PM

## 2010-06-29 NOTE — Assessment & Plan Note (Signed)
Summary: cough/metallic taste in mouth/njr   Vital Signs:  Patient profile:   55 year old female Menstrual status:  postmenopausal Weight:      155.5 pounds O2 Sat:      98 % on Room air Temp:     98.3 degrees F oral Pulse rate:   63 / minute BP sitting:   100 / 70  (left arm) Cuff size:   regular  Vitals Entered By: Romualdo Bolk, CMA (AAMA) (May 08, 2010 11:38 AM)  O2 Flow:  Room air CC: Started on 12/6 coughing, congestion drainage and metalic taste in mouth.   History of Present Illness: Sierra [name] comes in today  for above.  What seemed like rti  that improved and then  got worse.  She is leaving for travel in 2 weeks .     Onset  a week ago and better then worse.  the past few days.  more drippy and coughing and sneezing. NO fver.  Little clogged in nose but not now.   Took one dayquil  no help now.     Has funny taset metallic in mouth. No change in her baseline SOB..doe..  Preventive Screening-Counseling & Management  Alcohol-Tobacco     Alcohol drinks/day: <1     Alcohol type: wine     Smoking Status: never  Caffeine-Diet-Exercise     Caffeine use/day: 1-2     Does Patient Exercise: yes  Current Medications (verified): 1)  Triamterene-Hctz 37.5-25 Mg Caps (Triamterene-Hctz) .... Qam  Allergies (verified): 1)  ! Morphine  Past History:  Past medical, surgical, family and social histories (including risk factors) reviewed, and no changes noted (except as noted below).  Past Medical History: Reviewed history from 03/09/2010 and no changes required. see paper chart and had ekgs and eval in past some breathing chest symptom  G2P3 Stress myoview neg 2006 Menieres dx    -on trimaterene/hctz Tracheobronchopathica Osteochondroplastica    -involvement of trachea per FOB 02/2010 Doppler right leg 2010 Neg Consults Dr. Teressa Senter Dr. Rosalio Macadamia   Past Surgical History: Reviewed history from 02/18/2009 and no changes  required. colonoscopy-08/07/2006 skin graft 1963  Past History:  Care Management: Gynecology: Rosalio Macadamia Gastroenterology: Juanda Chance ENT: Jac Canavan Orthopedics: Darrick Penna, [place]  Family History: Reviewed history from 03/24/2010 and no changes required. Family History Diabetes 1st degree relative Family History High cholesterol Family History Uterine cancer mom and sis 34  Fam hx MS sister  Father: high cholesterol, macular degeneration, asthma, allergies, prostate ca Mother:  uterine cancer Siblings: MS Sister, uterine ca-sister    Social History: Reviewed history from 04/26/2009 and no changes required. 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         still has  DOE that is different than a year ago  unrealated to todays illness.    under eval.    Physical Exam  General:  Well-developed,well-nourished,in no acute distress; alert,appropriate and cooperative throughout examination  very congested  and washed out  Head:  normocephalic, atraumatic, and no abnormalities observed.   Eyes:  vision grossly intact, pupils equal, and pupils round.   Ears:  R ear normal, L ear normal, and no external deformities.   Nose:  no external deformity and no external  erythema.  2+ congested  face mildy full on palpation Mouth:  good dentition and pharynx pink and moist.   Neck:  No deformities, masses, or tenderness noted. Lungs:  Normal respiratory effort, chest expands symmetrically. Lungs are clear to auscultation, no crackles or wheezes.normal breath sounds.   Heart:  Normal rate and regular rhythm. S1 and S2 normal without gallop, murmur, click, rub or other  extra sounds. Pulses:  pulses intact without delay   Extremities:  pulses intact without delay   Neurologic:  alert & oriented X3 and gait normal.   Skin:  turgor normal, color normal, no ecchymoses, and no petechiae.   Cervical Nodes:  No lymphadenopathy noted Psych:  Oriented X3, normally interactive, good eye contact, not anxious appearing, and not depressed appearing.     Impression & Recommendations:  Problem # 1:  SINUSITIS - ACUTE-NOS (ICD-461.9) poss viral  but some relapsing signs and  underlying  pulm eval  but no alarm features otherwise.Marland Kitchen   disc  warning signs  and rx preflight  .  can add antibiotic  for alarm features  if not getting better  Her updated medication list for this problem includes:    Amoxicillin 875 Mg Tabs (Amoxicillin) .Marland Kitchen... 1 by mouth three times a day for sinusitis  Problem # 2:  OTHER DISEASES OF TRACHEA AND BRONCHUS (ICD-519.19) Assessment: Unchanged reviewed    Problem # 3:  SHORTNESS OF BREATH (ICD-786.05) ongoing   ....  Problem # 4:  PULMONARY NODULE, RIGHT UPPER LOBE (ICD-518.89) due for  recheck in  late spring.     Complete Medication List: 1)  Triamterene-hctz 37.5-25 Mg Caps (Triamterene-hctz) .... Qam 2)  Amoxicillin 875 Mg Tabs (Amoxicillin) .Marland Kitchen.. 1 by mouth three times a day for sinusitis  Patient Instructions: 1)  Acute sinusitis symptoms for less than 10 days are not helped by antibiotics. Use warm moist compresses, and over the counter decongestants( only as directed). Call if no improvement in 5-7 days, sooner if increasing pain, fever, or new symptoms.  2)  Can add  amoxicillin  for sinusitis  as directed  Prescriptions: AMOXICILLIN 875 MG TABS (AMOXICILLIN) 1 by mouth three times a day for sinusitis  #30 x 0   Entered and Authorized by:   Madelin Headings MD   Signed by:   Madelin Headings MD on 05/08/2010   Method used:   Print then Give to Patient   RxID:   [id number]    Orders Added: 1)  Est. Patient Level IV  [[id number]]

## 2010-07-20 ENCOUNTER — Encounter: Payer: Self-pay | Admitting: Internal Medicine

## 2010-07-20 ENCOUNTER — Ambulatory Visit (INDEPENDENT_AMBULATORY_CARE_PROVIDER_SITE_OTHER): Payer: BC Managed Care – PPO | Admitting: Internal Medicine

## 2010-07-20 DIAGNOSIS — R0602 Shortness of breath: Secondary | ICD-10-CM

## 2010-07-25 NOTE — Assessment & Plan Note (Signed)
Summary: 1 mth rov./sl   Visit Type:  Follow-up Primary Provider:  Dr. Darrell Jewel  CC:  some shortness of breath.  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   PMHx notable only for some mild situational HTN in past. No diabetes, hyperlipidemia or known cardiac disease. Has always been fairly active. Was on/off a bit this summer. Then had abrupt onset of exertional dyspnea and fatigue which she has not recovered from. She notes that she had extensive oral surgery just prior to this.   W/u to date:  1) LE venous Dopplers and CT chest. No DVT or PE. CT notable for 6mm lung nodule and tracheal abnormality. Had bronch showing benign tracheal nodules. PFTs minimal obstruction. Hgb OK.   2) Echo and stress echo 11/11: normal except for grade 2 diastolic dysfx. EF 60%. No PAH. BNP 58. N signs of vegetation or significant valvular abnormalities.   3) CPX test 11/11 pVO2 27.5 (115% predicted) slope 24 O2 pulse 11 (110%) RER 1.33 VE/MVV 72% - totally normal with no evidence cardiopulmonary limitation to exercise  At last visit, we discussed options and decided to take a wait and see approach. Returns for f/u. Started working with a Psychologist, educational and doing well, though relatively low-intensity. Also took a trip and was ok - mild dyspnea on hills. Doubles tennis - ok. This am did elliptical and was fine but did upper body TR bands got very SOB. Was able to do this without a problem a few weeks ago. Notest that she forgot her diuretic pill yesterday and weight up 4 pounds. No CP. No fevers or chills.    Problems Prior to Update: 1)  Sinusitis - Acute-nos  (ICD-461.9) 2)  Other Diseases of Trachea and Bronchus  (ICD-519.19) 3)  Pulmonary Nodule, Right Upper Lobe  (ICD-518.89) 4)  Calf Pain, Right  (ICD-729.5) 5)  Fatigue  (ICD-780.79) 6)  Shortness of Breath   (ICD-786.05) 7)  Hx of Contusion of Lower Leg  (ICD-924.10) 8)  Unspec Local Infection Skin&subcutaneous Tissue  (ICD-686.9) 9)  Metatarsalgia  (ICD-726.70) 10)  Hip Pain, Left  (ICD-719.45) 11)  Anserine Bursitis, Left  (ICD-726.61) 12)  Knee Pain, Left  (ICD-719.46) 13)  Unspecified Menieres Disease  (ICD-386.00) 14)  Preventive Health Care  (ICD-V70.0) 15)  Phlebitis, Lower Extremity  (ICD-451.2) 16)  Cellulitis, Leg, Right  (ICD-682.6) 17)  Unspecified Hearing Loss  (ICD-389.9) 18)  ? of Otitis Media, Serous  (ICD-381.4) 19)  Dizziness  (ICD-780.4) 20)  Back Pain, Thoracic Region  (ICD-724.1) 21)  Allergic Urticaria  (ICD-708.0) 22)  Acute Uris of Unspecified Site  (ICD-465.9) 23)  Family History Diabetes 1st Degree Relative  (ICD-V18.0)  Current Medications (verified): 1)  Triamterene-Hctz 37.5-25 Mg Caps (Triamterene-Hctz) .... Once Daily  Allergies (verified): 1)  ! Morphine  Past History:  Past Medical History: Last updated: 03/09/2010 see paper chart and had ekgs and eval in past some breathing chest symptom  G2P3 Stress myoview neg 2006 Menieres dx    -on trimaterene/hctz Tracheobronchopathica Osteochondroplastica    -involvement of trachea per FOB 02/2010 Doppler right leg 2010 Neg Consults Dr. Teressa Senter Dr. Rosalio Macadamia   Family History: Reviewed history from 03/24/2010 and no changes required. Family History Diabetes 1st degree relative Family History High cholesterol Family History Uterine cancer mom and sis 85  Fam hx MS sister  Father: high cholesterol, macular degeneration, asthma, allergies,  prostate ca Mother:  uterine cancer Siblings: MS Sister, uterine ca-sister    Social History: Reviewed history from 04/26/2009 and no changes required. 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   Vital Signs:  Patient profile:   55 year old female Menstrual status:  postmenopausal Height:      65 inches Weight:      157.50 pounds BMI:     26.30 Pulse rate:   73 / minute BP sitting:   108 / 68  (left arm) Cuff size:   regular  Vitals Entered By: Micki Riley CNA (July 20, 2010 4:37 PM)  Physical Exam  General:  well appearing. no resp difficulty HEENT: normal Neck: supple. no JVD. Carotids 2+ bilat; no bruits. No lymphadenopathy or thryomegaly appreciated. Cor: PMI nondisplaced. Regular rate & rhythm. No rubs, gallops, murmur. Lungs: clear Abdomen: soft, nontender, nondistended. No hepatosplenomegaly. No bruits or masses. Good bowel sounds. Extremities: no cyanosis, clubbing, rash, tr edema Neuro: alert & orientedx3, cranial nerves grossly intact. moves all 4 extremities w/o difficulty. affect pleasant    Impression & Recommendations:  Problem # 1:  SHORTNESS OF BREATH (ICD-786.05) I continue to be somewhat puzzled by her symptoms. We discussed multiple possiblities including CAD, diastolic HF, PAH, endocarditis (recent dental work), peripheral myopathy, and deconditioning. As the symptoms are not strictly reproducible on exertion and stress test was negative I feel that CAD is much less probable. We again discussed possibility of R and L cath to further elucidate but she is reluctant to pursue an invasive approach yet. She will keep a journal to compare her weight/fluid status and see whether or not this correltates with her exercise intolerance. She will call me in 2-3 weeks to discuss. Total time spent reviewing studies, discussing with patient and dictating > 45 mins.    Prevention & Chronic Care Immunizations   Influenza vaccine: Fluvax 3+  (02/18/2009)    Tetanus booster: 06/25/2006: Tdap    Pneumococcal vaccine: Not documented  Colorectal Screening   Hemoccult: Not documented    Colonoscopy: normal  (08/07/2006)  Other Screening   Pap  smear: abnormal  (10/26/2008)    Mammogram: normal  (10/22/2008)   Smoking status: never  (05/08/2010)  Lipids   Total Cholesterol: 212  (03/23/2009)   LDL: 103  (10/13/2007)   LDL Direct: 112.8  (03/23/2009)   HDL: 85.60  (03/23/2009)   Triglycerides: 49.0  (03/23/2009)

## 2010-08-07 LAB — HM COLONOSCOPY: HM Colonoscopy: NORMAL

## 2010-08-10 LAB — CULTURE, RESPIRATORY W GRAM STAIN

## 2010-08-10 LAB — FUNGUS CULTURE W SMEAR

## 2010-08-10 LAB — AFB CULTURE WITH SMEAR (NOT AT ARMC): Acid Fast Smear: NONE SEEN

## 2010-09-15 ENCOUNTER — Other Ambulatory Visit: Payer: Self-pay | Admitting: Critical Care Medicine

## 2010-09-15 DIAGNOSIS — R911 Solitary pulmonary nodule: Secondary | ICD-10-CM

## 2010-09-18 ENCOUNTER — Ambulatory Visit (INDEPENDENT_AMBULATORY_CARE_PROVIDER_SITE_OTHER): Payer: BC Managed Care – PPO | Admitting: Internal Medicine

## 2010-09-18 ENCOUNTER — Encounter: Payer: Self-pay | Admitting: Internal Medicine

## 2010-09-18 VITALS — BP 100/70 | HR 66 | Wt 151.0 lb

## 2010-09-18 DIAGNOSIS — R238 Other skin changes: Secondary | ICD-10-CM

## 2010-09-18 DIAGNOSIS — J984 Other disorders of lung: Secondary | ICD-10-CM

## 2010-09-18 DIAGNOSIS — R233 Spontaneous ecchymoses: Secondary | ICD-10-CM | POA: Insufficient documentation

## 2010-09-18 NOTE — Patient Instructions (Signed)
Will notify you  of labs when available. NO evidence of other bleeding disorder.

## 2010-09-18 NOTE — Progress Notes (Signed)
  Subjective:    Patient ID: Sierra [name], female    DOB: 11/11/1955, 55 y.o.   MRN: [id number]  HPI Patient comes in today for an acute visit because of concern about bruising. She feels like she is tending to bruise easily but most recently to have 2 new puppies labs  that jumped up and did some ongoing vague not forcefully and she was noted to soon after that have significant bruising and streaking of bruising. She's had no bleeding outside the scan of bruising on her trunk nosebleeds blood elsewhere.    She does not take aspirin or any anticoagulants or excess herbals. She is being followed by cardiology and pulmonology for a pulmonary nodule and some tracheobronchial nodules. She is to have a chest CT tomorrow.  Neg fam hx of easy bleeding clotting.  Review of Systems Negative for cough hemoptysis major change in vision or hearing she still feels that her exercise tolerance is not up to par but no recent changes.  No history of clotting. Fever weight loss swollen glands night sweats rest as per hpi  Past history family history social history reviewed in the electronic medical record.     Objective:   Physical Exam Well-developed well-nourished in no acute distress HEENT TMs clear OP normal airway neck without significant adenopathy ac or pc  Chest:  Clear to A&P without wheezes rales or rhonchi CV:  S1-S2 no gallops or murmurs peripheral perfusion is normal Abdomen:  Soft normal bowel sounds without hepatosplenomegaly, no guarding rebound or masses no CVA tenderness  No clubbing cyanosis or edema .   LN: no sig  cervical axillary inguinal adenopathy EXTR:  Left leg with large area of bruising in medal calf and line post calf  Of purple bruising. No petechia .Marland Kitchen Trunk neck and face are clear as well as arms.       Assessment & Plan:  Easy bruising   LE  ?  Rest of exam normal regarding the hem onc ln system.      Check labs  And if ok follow .   Recheck if progressing   Bruising or petechiae  Pulm nodule to get ct tomorrow.   Reviewed lab review in record last lfts and bmp was in the fall.

## 2010-09-19 ENCOUNTER — Ambulatory Visit (INDEPENDENT_AMBULATORY_CARE_PROVIDER_SITE_OTHER)
Admission: RE | Admit: 2010-09-19 | Discharge: 2010-09-19 | Disposition: A | Discharge status: Home / Self Care | Payer: BC Managed Care – PPO | Source: Ambulatory Visit | Attending: Critical Care Medicine | Admitting: Critical Care Medicine

## 2010-09-19 DIAGNOSIS — J984 Other disorders of lung: Secondary | ICD-10-CM

## 2010-09-19 DIAGNOSIS — R911 Solitary pulmonary nodule: Secondary | ICD-10-CM

## 2010-09-19 LAB — CBC WITH DIFFERENTIAL/PLATELET
Basophils Absolute: 0.1 10*3/uL (ref 0.0–0.1)
Eosinophils Absolute: 0.2 10*3/uL (ref 0.0–0.7)
HCT: 42.2 % (ref 36.0–46.0)
Lymphs Abs: 2.1 10*3/uL (ref 0.7–4.0)
Monocytes Absolute: 0.7 10*3/uL (ref 0.1–1.0)
Monocytes Relative: 10.3 % (ref 3.0–12.0)
Platelets: 247 10*3/uL (ref 150.0–400.0)
RDW: 13.3 % (ref 11.5–14.6)

## 2010-09-19 LAB — PROTIME-INR: Prothrombin Time: 12.3 s (ref 10.2–12.4)

## 2010-09-19 NOTE — Progress Notes (Signed)
Addended by: Rossie Muskrat on: 09/19/2010 01:59 PM   Modules accepted: Orders

## 2010-09-21 ENCOUNTER — Telehealth: Payer: Self-pay | Admitting: *Deleted

## 2010-09-21 ENCOUNTER — Other Ambulatory Visit (INDEPENDENT_AMBULATORY_CARE_PROVIDER_SITE_OTHER): Payer: BC Managed Care – PPO

## 2010-09-21 DIAGNOSIS — R238 Other skin changes: Secondary | ICD-10-CM

## 2010-09-21 LAB — PROTIME-INR: INR: 1.2 ratio — ABNORMAL HIGH (ref 0.8–1.0)

## 2010-09-21 NOTE — Telephone Encounter (Signed)
Lab order

## 2010-09-21 NOTE — Telephone Encounter (Signed)
Left message on machine about results. Order placed in [facility] to go over to [facility] to have labs done.

## 2010-09-21 NOTE — Telephone Encounter (Signed)
Message copied by Tor Netters on Thu Sep 21, 2010  1:01 PM ------      Message from: [facility], [place]      Created: Wed Sep 20, 2010  1:59 PM       Tell patient her blood count is fine. Her blood clotting studies are borderline normal /abnormal . This is probably not  Clinically significant  However we probably should  repeat the test ProTime  and APTT  But have it drawn at the [facility] to ensure  No lab transport effect on results.   A

## 2010-10-13 NOTE — Op Note (Signed)
NAMECAMERAN, Sierra [name]                         ACCOUNT NO.:  [id number]   MEDICAL RECORD NO.:  [id number]                   PATIENT TYPE:  AMB   LOCATION:  [facility]                                  FACILITY:  [facility]   PHYSICIAN:  Robert A. Thurston Hole, M.D.              DATE OF BIRTH:  June 10, 1955   DATE OF PROCEDURE:  02/02/2002  DATE OF DISCHARGE:                                 OPERATIVE REPORT   PREOPERATIVE DIAGNOSES:  1. 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  2. Right knee lateral meniscus tear.   POSTOPERATIVE DIAGNOSES:  1. 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  2. Right knee lateral meniscus tear.   PROCEDURE:  Right knee examination under anesthesia followed by  arthroscopically assisted allograft bone patellar tendon bone ACL  reconstruction using 9 x 25 mm femoral interference bio screw and 9 x 25 mm  tibial interference bio screw.  Femoral right knee partial lateral  meniscectomy.   SURGEON:  Elana Alm. Thurston Hole, M.D.   ASSISTANT:  Julien Girt, P.A.   ANESTHESIA:  General anesthesia.   OPERATIVE TIME:  One hour and 30 minutes.   COMPLICATIONS:  None.   INDICATIONS FOR PROCEDURE:  The patient is a 55 year old woman who just had  significant pain secondary to a pivot shift injury six weeks ago. She has  had previous ACL deficient knee and has done well except for intermittent  pivot shifting but this most recent episode has cost increased pain with  increased instability and lack of response to conservative care and is now  to undergo arthroscopy with ACL reconstruction.   DESCRIPTION OF PROCEDURE:  The patient was brought to the operating room on  February 02, 2002, placed on the operating table in the supine position.  After an adequate level of general anesthesia was obtained, her right knee  was examined under anesthesia.  She had full range of motion, 2+ Lachman's,  positive pivot shift, knee stable to  varus, valgus and posterior stress with  normal patellar tracking.  Left knee revealed full range of motion, knee  stable with negative pivot shift. The right knee was sterilely injected with  0.25% Marcaine with epinephrine.  She received Ancef 1 g IV preoperatively  for prophylaxis.  The right leg was then prepped using sterile DuraPrep and  draped using sterile technique.  Initially, the arthroscopy was performed  through an inferior lateral portal.  The arthroscope with a pump attachment  was placed and through an inferior medial portal, an arthroscopic probe was  placed.  Upon initial inspection of the medial compartment, articular  cartilage, medial femoral condyle, medial tibial condyle, medial tibial  plateau was normal, medial meniscus was probed and this was found to be  normal.  The intercondylar notch inspected, anterior cruciate ligament was  nonexistent with significant anterior laxity of 4 to 6 mm, posterior  cruciate was intact and stable.  Lateral compartment was inspected,  articular cartilage,  lateral femoral condyle, lateral tibial plateau showed  mild grade I to II chondromalacia.  Lateral meniscus was probed and she had  a tear of the posterior horn 25 to 30% which was resected back to a stable  rim.  The rest of the lateral meniscus and popliteus tendon were intact.  The patellofemoral joint showed mild grade I to II chondromalacia. The  patella tracked normally.  Moderate synovitis in the medial and lateral  gutters were debrided, otherwise they are free of pathology.  At this point,  then on the back table, the ACLL graft was prepared with 10 x 25 mm of  patellar bone and tibial tubercle bone and a 2 x 12 mm graft.  A Steinmann a  pin was then used and an ACL drill guide through a 1.5 cm proximal tibial  incision to drill the Steinmann pin up in the ACL insertion point along the  tibial plateau. This was then overdrilled with a 10 mm drill.  Through this  hole, a  posterior femoral guide was placed after a notchplasty had been  performed.  Steinmann pin drilled up in the ACL origin point and then  overdrilled with a 10 mm drill to a depth of 30 mm leaving a posterior 2 mm  bone bridge.  After this was done on the double pin, passer was brought  through the tibial hole and joint and femoral tunnel and through the femoral  cortex and thigh through a stab wound. This was used to pass the ACL  allograft up through the tibial tunnel and joint into the femoral tunnel.  It was locked into position there with a 9 x 25 mm interference bio screw.  The knee was then brought through a full range of motion.  There was found  to be no impingement of the graft and then the tibial bone probe was locked  into its tunnel with a 9 x 25 mm interference bio screw with the knee in 30  degrees of flexion and the tibia held reduced on the femur.  After this was  done, the knee was tested for stability.  Lachman's and pivot shift was  eliminated. The knee could be brought through full range of motion with no  impingement of the graft.  At this point, then the wounds were irrigated and  closed using 2-0 Vicryl and 3-0 Prolene.  Steri-Strips were applied.  Sterile dressings and a long leg splint applied.  A femoral nerve block was  then placed by Dr. Michelle Piper of anesthesia for postoperative pain control. She  was then awakened and taken to the recovery room in stable condition.  Needle and sponge counts correct x2 at the end of the case.   FOLLOW-UP CARE:  The patient will be followed overnight at the recovery care  center for IV pain control, neurovascular monitoring, CPM and ice machine  use.  Discharge tomorrow on Percocet and Naprosyn with a home CPM and ice  machine.  See me back in the office in a week for sutures out and follow-up.                                               Robert A. Thurston Hole, M.D.   RAW/MEDQ  D:  02/02/2002  T:  02/03/2002  Job:  [id number]

## 2010-10-19 ENCOUNTER — Encounter: Payer: Self-pay | Admitting: *Deleted

## 2010-10-25 ENCOUNTER — Ambulatory Visit: Payer: BC Managed Care – PPO | Admitting: Internal Medicine

## 2010-10-25 ENCOUNTER — Telehealth: Payer: Self-pay | Admitting: *Deleted

## 2010-10-25 MED ORDER — PROMETHAZINE HCL 25 MG PO TABS: ORAL_TABLET | ORAL | Status: DC

## 2010-10-25 NOTE — Telephone Encounter (Signed)
Pt's 3 dogs have had a viral GI bug........vomiting and diarrhea.  She is feeling nauseated and wants to know if she could have caught it from them.  ??   If so, needs meds for nausea and diarrhea since she is flying out of town tomorrow for 2 days.

## 2010-10-25 NOTE — Telephone Encounter (Signed)
Per Dr. Fabian Sharp- ask vet about getting it from the dogs. Can call in phenergan 25mg  #12 1 po q 4-6 hours prn and otc immodium if needed. Left message on machine about above and rx sent to pharmacy

## 2010-11-20 ENCOUNTER — Other Ambulatory Visit: Payer: Self-pay | Admitting: Internal Medicine

## 2010-11-20 DIAGNOSIS — Z1231 Encounter for screening mammogram for malignant neoplasm of breast: Secondary | ICD-10-CM

## 2010-11-28 ENCOUNTER — Ambulatory Visit
Admission: RE | Admit: 2010-11-28 | Discharge: 2010-11-28 | Disposition: A | Discharge status: Home / Self Care | Payer: BC Managed Care – PPO | Source: Ambulatory Visit | Attending: Internal Medicine | Admitting: Internal Medicine

## 2010-11-28 DIAGNOSIS — Z1231 Encounter for screening mammogram for malignant neoplasm of breast: Secondary | ICD-10-CM

## 2010-12-22 ENCOUNTER — Encounter: Payer: Self-pay | Admitting: Internal Medicine

## 2010-12-27 ENCOUNTER — Ambulatory Visit: Payer: BC Managed Care – PPO | Admitting: Internal Medicine

## 2010-12-28 ENCOUNTER — Encounter: Payer: Self-pay | Admitting: Internal Medicine

## 2010-12-28 ENCOUNTER — Ambulatory Visit (INDEPENDENT_AMBULATORY_CARE_PROVIDER_SITE_OTHER): Payer: BC Managed Care – PPO | Admitting: Internal Medicine

## 2010-12-28 VITALS — BP 115/66 | HR 62 | Resp 18 | Ht 64.0 in | Wt 150.0 lb

## 2010-12-28 DIAGNOSIS — R072 Precordial pain: Secondary | ICD-10-CM | POA: Insufficient documentation

## 2010-12-28 DIAGNOSIS — R0602 Shortness of breath: Secondary | ICD-10-CM

## 2010-12-28 NOTE — Assessment & Plan Note (Signed)
Suspicious for GERD. Will try course of PPI to see if it helps.

## 2010-12-28 NOTE — Progress Notes (Signed)
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 Caralyn Guile and director of [employer]) who presents for f/u of exertional dyspnea.   PMHx notable only for some mild situational HTN in past. No diabetes, hyperlipidemia or known cardiac disease. Has always been fairly active. Was on/off a bit this summer. Then had abrupt onset of exertional dyspnea and fatigue which she has not recovered from. She notes that she had extensive oral surgery just prior to this.   W/u to date:  1) LE venous Dopplers and CT chest. No DVT or PE. CT notable for 6mm lung nodule and tracheal abnormality. Had bronch showing benign tracheal nodules. PFTs minimal obstruction. Hgb OK.   2) Echo and stress echo 11/11: normal except for grade 2 diastolic dysfx. EF 60%. No PAH. BNP 58. N signs of vegetation or significant valvular abnormalities.   3) CPX test 11/11 pVO2 27.5 (115% predicted) slope 24 O2 pulse 11 (110%) RER 1.33 VE/MVV 72% - totally normal with no evidence cardiopulmonary limitation to exercise  Returns for routine f/u. Feeling better. Exercising with a trainer. Sometimes feels great and sometimes very dyspneic after exercise but overall better. Over past few nights has had some chest tightness. Goes away spontaneously. No associated symptoms.    ROS: All systems negative except as listed in HPI, PMH and Problem List.  Past Medical History  Diagnosis Date  . Meniere's disease   . Tracheobronchopathia-osteochondroplastica 02/2010    involvement of trachea per FOB  . Normal nuclear stress test     Myoview 2006   . History of Doppler ultrasound     leg negative  2010 right  . History of Doppler ultrasound     right leg 2010 neg    Current Outpatient Prescriptions  Medication Sig Dispense Refill  . triamterene-hydrochlorothiazide (MAXZIDE-25) 37.5-25 MG per tablet Take 1 tablet by mouth daily.           PHYSICAL EXAM: Filed Vitals:   12/28/10 1621  BP: 115/66  Pulse: 62   Resp: 18   General:  Well appearing. No resp difficulty HEENT: normal Neck: supple. JVP flat. Carotids 2+ bilaterally; no bruits. No lymphadenopathy or thryomegaly appreciated. Cor: PMI normal. Regular rate & rhythm. No rubs, gallops or murmurs. Lungs: clear Abdomen: soft, nontender, nondistended. No hepatosplenomegaly. No bruits or masses. Good bowel sounds. Extremities: no cyanosis, clubbing, rash, edema Neuro: alert & orientedx3, cranial nerves grossly intact. Moves all 4 extremities w/o difficulty. Affect pleasant.    ECG:  Sinus brady 56 No ST-T wave abnormalities.    ASSESSMENT & PLAN:

## 2010-12-28 NOTE — Assessment & Plan Note (Signed)
Doing well. Symptoms improving gradually. All testing reassuring. 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 Did discuss possibility of cardiac CT, if symptoms persist.

## 2011-05-03 ENCOUNTER — Other Ambulatory Visit: Payer: Self-pay | Admitting: Physician Assistant

## 2011-05-03 ENCOUNTER — Encounter: Payer: Self-pay | Admitting: Physician Assistant

## 2011-05-03 DIAGNOSIS — S83281A Other tear of lateral meniscus, current injury, right knee, initial encounter: Secondary | ICD-10-CM | POA: Insufficient documentation

## 2011-05-03 DIAGNOSIS — S83289A Other tear of lateral meniscus, current injury, unspecified knee, initial encounter: Secondary | ICD-10-CM

## 2011-05-03 HISTORY — DX: Other tear of lateral meniscus, current injury, right knee, initial encounter: S83.281A

## 2011-05-03 NOTE — H&P (Signed)
Sierra [name] is an 55 y.o. female.   Chief Complaint: right knee pain HPI: Sierra [name] is seen for evaluation after-hours for a right locked knee that occurred when she got up from a sitting position at a meeting. She was unable to straighten her knee and is seen for evaluation of this. She had a right knee ACL reconstruction by me on 02/02/02 with an allograft ACL reconstruction and partial lateral meniscectomy. She was doing well and we have not seen her since 2005. She's been intermittently exercising without problems.  Past Medical History  Diagnosis Date  . Meniere's disease   . Tracheobronchopathia-osteochondroplastica 02/2010    involvement of trachea per FOB  . Normal nuclear stress test     Myoview 2006   . History of Doppler ultrasound     leg negative  2010 right  . History of Doppler ultrasound     right leg 2010 neg    Past Surgical History  Procedure Date  . Myoview 2006    stress- neg  . Doppler echocardiography 2010    rt leg neg  . Skin graft 1963    Family History  Problem Relation Age of Onset  . Uterine cancer Mother     sister  . Asthma Father   . Hyperlipidemia Father   . Macular degeneration Father   . Allergies Father   . Prostate cancer Father   . Multiple sclerosis Sister   . Uterine cancer Sister    Social History:  reports that she has never smoked. She does not have any smokeless tobacco history on file. She reports that she drinks alcohol. She reports that she does not use illicit drugs.  Allergies:  Allergies  Allergen Reactions  . Morphine     Medications Prior to Admission  Medication Sig Dispense Refill  . triamterene-hydrochlorothiazide (MAXZIDE-25) 37.5-25 MG per tablet Take 1 tablet by mouth daily.         No current facility-administered medications on file as of 05/03/2011.    No results found for this or any previous visit (from the past 48 hour(s)). No results found.  Review of Systems  Constitutional: Negative.   HENT:  Negative.   Eyes: Negative.   Respiratory: Negative.   Cardiovascular: Negative.   Gastrointestinal: Negative.   Genitourinary: Negative.   Musculoskeletal: Positive for joint pain.       Right knee pain  Skin: Negative.   Neurological: Negative.   Endo/Heme/Allergies: Negative.   Psychiatric/Behavioral: Negative.     There were no vitals taken for this visit. Physical Exam  Constitutional: She is oriented to person, place, and time. She appears well-developed and well-nourished.  HENT:  Head: Normocephalic and atraumatic.  Eyes: EOM are normal. Pupils are equal, round, and reactive to light.  Neck: Neck supple.  Cardiovascular: Normal rate.   Respiratory: Effort normal.  GI: Soft.  Genitourinary:       Not pertinent to current symptomatology therefore not examined.  Musculoskeletal: She exhibits tenderness.       Examination of her right knee reveals well healed ACL incisions.  No swelling or pain.  Full range of motion.  Knee is stable with normal patellar tracking and mild lateral joint line pain and mildly positive lateral McMurray's.  Examination of her left knee reveals full range of motion without pain, swelling, weakness or instability.  Vascular exam: Pulses are 2+ and symmetric.   Neurological: She is alert and oriented to person, place, and time.  Skin: Skin is warm and   dry.  Psychiatric: She has a normal mood and affect.     Assessment Patient Active Problem List  Diagnoses  . UNSPECIFIED MENIERES DISEASE  . UNSPECIFIED HEARING LOSS  . PHLEBITIS, LOWER EXTREMITY  . PULMONARY NODULE, RIGHT UPPER LOBE  . OTHER DISEASES OF TRACHEA AND BRONCHUS  . ALLERGIC URTICARIA  . HIP PAIN, LEFT  . KNEE PAIN, LEFT  . BACK PAIN, THORACIC REGION  . ANSERINE BURSITIS, LEFT  . METATARSALGIA  . CALF PAIN, RIGHT  . DIZZINESS  . FATIGUE  . SHORTNESS OF BREATH  . CONTUSION OF LOWER LEG  . Easy bruisability  . Precordial pain  . Acute lateral meniscus tear of right knee      Plan I spoke to Ms. Prajapati about her right knee MRI which revealed a lateral meniscus tear with intact ACL graft. Initial reading on MRI was that she did not have a meniscal tear but after further review with the radiologist they agreed with me that the posterior horn of the lateral meniscus showed significant irregularity consistent with a lateral meniscus tear. I recommend we proceed with right knee arthroscopy with attention to her meniscal pathology. Discussed risks benefits and possible complications of the surgery in detail and she understands this completely. We'll plan on setting her up for this at some point in the near future.    Wiletta Bermingham J 05/03/2011, 12:04 PM    

## 2011-05-09 ENCOUNTER — Encounter (HOSPITAL_BASED_OUTPATIENT_CLINIC_OR_DEPARTMENT_OTHER): Payer: Self-pay | Admitting: *Deleted

## 2011-05-09 NOTE — Progress Notes (Signed)
Needs istat am surg

## 2011-05-11 ENCOUNTER — Encounter (HOSPITAL_BASED_OUTPATIENT_CLINIC_OR_DEPARTMENT_OTHER): Payer: Self-pay

## 2011-05-11 ENCOUNTER — Encounter (HOSPITAL_BASED_OUTPATIENT_CLINIC_OR_DEPARTMENT_OTHER)
Admission: RE | Disposition: A | Discharge status: Home / Self Care | Payer: Self-pay | Source: Ambulatory Visit | Attending: Orthopedic Surgery

## 2011-05-11 ENCOUNTER — Ambulatory Visit (HOSPITAL_BASED_OUTPATIENT_CLINIC_OR_DEPARTMENT_OTHER)
Admission: RE | Admit: 2011-05-11 | Discharge: 2011-05-11 | Disposition: A | Discharge status: Home / Self Care | Payer: BC Managed Care – PPO | Source: Ambulatory Visit | Attending: Orthopedic Surgery | Admitting: Orthopedic Surgery

## 2011-05-11 ENCOUNTER — Ambulatory Visit (HOSPITAL_BASED_OUTPATIENT_CLINIC_OR_DEPARTMENT_OTHER): Payer: BC Managed Care – PPO | Admitting: Anesthesiology

## 2011-05-11 ENCOUNTER — Encounter (HOSPITAL_BASED_OUTPATIENT_CLINIC_OR_DEPARTMENT_OTHER): Payer: Self-pay | Admitting: Anesthesiology

## 2011-05-11 DIAGNOSIS — R0602 Shortness of breath: Secondary | ICD-10-CM | POA: Insufficient documentation

## 2011-05-11 DIAGNOSIS — M25569 Pain in unspecified knee: Secondary | ICD-10-CM

## 2011-05-11 DIAGNOSIS — M2240 Chondromalacia patellae, unspecified knee: Secondary | ICD-10-CM | POA: Insufficient documentation

## 2011-05-11 DIAGNOSIS — M23359 Other meniscus derangements, posterior horn of lateral meniscus, unspecified knee: Secondary | ICD-10-CM | POA: Insufficient documentation

## 2011-05-11 HISTORY — DX: Shortness of breath: R06.02

## 2011-05-11 HISTORY — PX: KNEE ARTHROSCOPY: SHX127

## 2011-05-11 SURGERY — ARTHROSCOPY, KNEE
Anesthesia: Choice | Laterality: Right

## 2011-05-11 MED ORDER — PROPOFOL 10 MG/ML IV EMUL
INTRAVENOUS | Status: DC | PRN
  Administered 2011-05-11: 150 mg via INTRAVENOUS

## 2011-05-11 MED ORDER — HYDROMORPHONE HCL PF 1 MG/ML IJ SOLN: 0.2500 mg | INTRAMUSCULAR | Status: DC | PRN

## 2011-05-11 MED ORDER — LIDOCAINE HCL (CARDIAC) 20 MG/ML IV SOLN
INTRAVENOUS | Status: DC | PRN
  Administered 2011-05-11: 100 mg via INTRAVENOUS

## 2011-05-11 MED ORDER — LIDOCAINE HCL 1.5 % IJ SOLN
INTRAMUSCULAR | Status: DC | PRN
  Administered 2011-05-11: 30 mL via INTRADERMAL

## 2011-05-11 MED ORDER — POVIDONE-IODINE 7.5 % EX SOLN: Freq: Once | CUTANEOUS | Status: DC

## 2011-05-11 MED ORDER — ONDANSETRON HCL 4 MG/2ML IJ SOLN
INTRAMUSCULAR | Status: DC | PRN
  Administered 2011-05-11: 4 mg via INTRAVENOUS

## 2011-05-11 MED ORDER — ONDANSETRON HCL 4 MG/2ML IJ SOLN: 4.0000 mg | Freq: Once | INTRAMUSCULAR | Status: DC | PRN

## 2011-05-11 MED ORDER — LACTATED RINGERS IV SOLN
INTRAVENOUS | Status: DC
  Administered 2011-05-11: 12:00:00 via INTRAVENOUS

## 2011-05-11 MED ORDER — HYDROCODONE-ACETAMINOPHEN 5-325 MG PO TABS: ORAL_TABLET | ORAL | Status: DC

## 2011-05-11 MED ORDER — MIDAZOLAM HCL 2 MG/2ML IJ SOLN
1.0000 mg | INTRAMUSCULAR | Status: AC | PRN
  Administered 2011-05-11 (×2): 1 mg via INTRAVENOUS

## 2011-05-11 MED ORDER — EPINEPHRINE HCL 1 MG/ML IJ SOLN
INTRAMUSCULAR | Status: DC | PRN
  Administered 2011-05-11: 2 mL

## 2011-05-11 MED ORDER — SODIUM CHLORIDE 0.9 % IR SOLN
Status: DC | PRN
  Administered 2011-05-11: 6000 mL

## 2011-05-11 MED ORDER — BUPIVACAINE-EPINEPHRINE PF 0.5-1:200000 % IJ SOLN
INTRAMUSCULAR | Status: DC | PRN
  Administered 2011-05-11: 30 mL

## 2011-05-11 MED ORDER — FENTANYL CITRATE 0.05 MG/ML IJ SOLN
INTRAMUSCULAR | Status: DC | PRN
  Administered 2011-05-11: 50 ug via INTRAVENOUS

## 2011-05-11 MED ORDER — CHLORHEXIDINE GLUCONATE 4 % EX LIQD: 60.0000 mL | Freq: Once | CUTANEOUS | Status: DC

## 2011-05-11 MED ORDER — MEPERIDINE HCL 25 MG/ML IJ SOLN
6.2500 mg | INTRAMUSCULAR | Status: DC | PRN
Start: 2011-05-11 — End: 2011-05-11

## 2011-05-11 MED ORDER — DEXAMETHASONE SODIUM PHOSPHATE 4 MG/ML IJ SOLN
INTRAMUSCULAR | Status: DC | PRN
  Administered 2011-05-11: 8 mg via INTRAVENOUS

## 2011-05-11 MED ORDER — FENTANYL CITRATE 0.05 MG/ML IJ SOLN
100.0000 ug | INTRAMUSCULAR | Status: DC | PRN
  Administered 2011-05-11: 100 ug via INTRAVENOUS

## 2011-05-11 MED ORDER — CEFAZOLIN SODIUM 1-5 GM-% IV SOLN
1.0000 g | INTRAVENOUS | Status: AC
  Administered 2011-05-11: 1 g via INTRAVENOUS

## 2011-05-11 SURGICAL SUPPLY — 54 items
APL SKNCLS STERI-STRIP NONHPOA (GAUZE/BANDAGES/DRESSINGS)
BANDAGE ELASTIC 6 VELCRO ST LF (GAUZE/BANDAGES/DRESSINGS) ×2 IMPLANT
BANDAGE ESMARK 6X9 LF (GAUZE/BANDAGES/DRESSINGS) IMPLANT
BENZOIN TINCTURE PRP APPL 2/3 (GAUZE/BANDAGES/DRESSINGS) IMPLANT
BLADE CUTTER GATOR 3.5 (BLADE) ×2 IMPLANT
BLADE GREAT WHITE 4.2 (BLADE) ×2 IMPLANT
BLADE SURG 15 STRL LF DISP TIS (BLADE) IMPLANT
BLADE SURG 15 STRL SS (BLADE)
BNDG CMPR 9X6 STRL LF SNTH (GAUZE/BANDAGES/DRESSINGS)
BNDG COHESIVE 4X5 TAN STRL (GAUZE/BANDAGES/DRESSINGS) IMPLANT
BNDG ESMARK 6X9 LF (GAUZE/BANDAGES/DRESSINGS)
CANISTER OMNI JUG 16 LITER (MISCELLANEOUS) IMPLANT
CANISTER SUCTION 2500CC (MISCELLANEOUS) IMPLANT
DRAPE ARTHROSCOPY W/POUCH 90 (DRAPES) ×2 IMPLANT
DURAPREP 26ML APPLICATOR (WOUND CARE) ×2 IMPLANT
GAUZE XEROFORM 1X8 LF (GAUZE/BANDAGES/DRESSINGS) ×2 IMPLANT
GLOVE BIO SURGEON STRL SZ7 (GLOVE) ×2 IMPLANT
GLOVE BIOGEL PI IND STRL 7.0 (GLOVE) ×1 IMPLANT
GLOVE BIOGEL PI IND STRL 7.5 (GLOVE) ×1 IMPLANT
GLOVE BIOGEL PI INDICATOR 7.0 (GLOVE) ×1
GLOVE BIOGEL PI INDICATOR 7.5 (GLOVE) ×1
GLOVE SS BIOGEL STRL SZ 7.5 (GLOVE) ×1 IMPLANT
GLOVE SUPERSENSE BIOGEL SZ 7.5 (GLOVE) ×1
GOWN PREVENTION PLUS XLARGE (GOWN DISPOSABLE) ×6 IMPLANT
HOLDER KNEE FOAM BLUE (MISCELLANEOUS) ×2 IMPLANT
KNEE WRAP E Z 3 GEL PACK (MISCELLANEOUS) ×2 IMPLANT
KWIRE 4.0 X .062IN (WIRE) IMPLANT
NDL SAFETY ECLIPSE 18X1.5 (NEEDLE) ×2 IMPLANT
NEEDLE HYPO 18GX1.5 SHARP (NEEDLE) ×4
NEEDLE HYPO 22GX1.5 SAFETY (NEEDLE) IMPLANT
PACK ARTHROSCOPY DSU (CUSTOM PROCEDURE TRAY) ×2 IMPLANT
PACK BASIN DAY SURGERY FS (CUSTOM PROCEDURE TRAY) ×2 IMPLANT
PAD ALCOHOL SWAB (MISCELLANEOUS) ×1 IMPLANT
SET ARTHROSCOPY TUBING (MISCELLANEOUS) ×2
SET ARTHROSCOPY TUBING LN (MISCELLANEOUS) ×1 IMPLANT
SPONGE GAUZE 4X4 12PLY (GAUZE/BANDAGES/DRESSINGS) ×2 IMPLANT
STRIP CLOSURE SKIN 1/2X4 (GAUZE/BANDAGES/DRESSINGS) IMPLANT
SUCTION FRAZIER TIP 10 FR DISP (SUCTIONS) IMPLANT
SUT ETHILON 4 0 PS 2 18 (SUTURE) ×2 IMPLANT
SUT FIBERWIRE #2 38 T-5 BLUE (SUTURE)
SUT PDS AB 0 CT 36 (SUTURE) IMPLANT
SUT PROLENE 3 0 PS 2 (SUTURE) IMPLANT
SUT VIC AB 0 CT1 18XCR BRD 8 (SUTURE) IMPLANT
SUT VIC AB 0 CT1 8-18 (SUTURE)
SUT VIC AB 2-0 CT1 27 (SUTURE)
SUT VIC AB 2-0 CT1 TAPERPNT 27 (SUTURE) IMPLANT
SUT VIC AB 3-0 PS1 18 (SUTURE)
SUT VIC AB 3-0 PS1 18XBRD (SUTURE) IMPLANT
SUTURE FIBERWR #2 38 T-5 BLUE (SUTURE) IMPLANT
SYR 20CC LL (SYRINGE) IMPLANT
SYR 5ML LL (SYRINGE) ×2 IMPLANT
TOWEL OR 17X24 6PK STRL BLUE (TOWEL DISPOSABLE) ×2 IMPLANT
WAND STAR VAC 90 (SURGICAL WAND) IMPLANT
WATER STERILE IRR 1000ML POUR (IV SOLUTION) ×2 IMPLANT

## 2011-05-11 NOTE — Anesthesia Preprocedure Evaluation (Addendum)
Anesthesia Evaluation  Patient identified by MRN, date of birth, ID band Patient awake    Reviewed: Allergy & Precautions, H&P , NPO status , Patient's Chart, lab work & pertinent test results  Airway Mallampati: I TM Distance: >3 FB Neck ROM: full    Dental   Pulmonary shortness of breath,  Has had some intermittent episodes of SOB worked up by Dr Milas Kocher. All studies normal. Still occurs occasionally.         Cardiovascular     Neuro/Psych    GI/Hepatic   Endo/Other    Renal/GU      Musculoskeletal   Abdominal   Peds  Hematology   Anesthesia Other Findings   Reproductive/Obstetrics                          Anesthesia Physical Anesthesia Plan  ASA: II  Anesthesia Plan: General LMA and Regional   Post-op Pain Management:    Induction:   Airway Management Planned:   Additional Equipment:   Intra-op Plan:   Post-operative Plan:   Informed Consent: I have reviewed the patients History and Physical, chart, labs and discussed the procedure including the risks, benefits and alternatives for the proposed anesthesia with the patient or authorized representative who has indicated his/her understanding and acceptance.     Plan Discussed with: CRNA and Surgeon  Anesthesia Plan Comments:         Anesthesia Quick Evaluation

## 2011-05-11 NOTE — Transfer of Care (Signed)
Immediate Anesthesia Transfer of Care Note  Patient: Sierra [name]  Procedure(s) Performed:  ARTHROSCOPY KNEE - with lateral meniscectomy  Patient Location: PACU  Anesthesia Type: GA combined with regional for post-op pain  Level of Consciousness: awake, alert  and oriented  Airway & Oxygen Therapy: Patient Spontanous Breathing and Patient connected to face mask oxygen  Post-op Assessment: Report given to PACU RN and Post -op Vital signs reviewed and stable  Post vital signs: Reviewed and stable  Complications: No apparent anesthesia complications

## 2011-05-11 NOTE — Anesthesia Postprocedure Evaluation (Signed)
  Anesthesia Post-op Note  Patient: Sierra [name]  Procedure(s) Performed:  ARTHROSCOPY KNEE - with lateral meniscectomy  Patient Location: PACU  Anesthesia Type: General  Level of Consciousness: awake  Airway and Oxygen Therapy: Patient Spontanous Breathing  Post-op Pain: none  Post-op Assessment: Post-op Vital signs reviewed  Post-op Vital Signs: stable  Complications: No apparent anesthesia complications

## 2011-05-11 NOTE — Progress Notes (Signed)
Assisted Dr. Michelle Piper with Right Knee BLOCK List:[id number]} block. Side rails up, monitors on throughout procedure. See vital signs in flow sheet. Tolerated Procedure well.

## 2011-05-11 NOTE — Anesthesia Procedure Notes (Addendum)
Anesthesia Regional Block:   Narrative:    Procedure Name: LMA Insertion Performed by: Lance Coon Pre-anesthesia Checklist: Patient identified, Emergency Drugs available, Suction available, Patient being monitored and Timeout performed Patient Re-evaluated:Patient Re-evaluated prior to inductionOxygen Delivery Method: Circle System Utilized Preoxygenation: Pre-oxygenation with 100% oxygen Intubation Type: IV induction Ventilation: Mask ventilation without difficulty LMA: LMA inserted LMA Size: 3.0 Number of attempts: 1 Tube secured with: Tape Dental Injury: Teeth and Oropharynx as per pre-operative assessment    Intra-articular Knee block with two inferior portals by Arta Bruce MD. Time out performed, Under sterile conditions 30cc Lidocaine 1.5% with epi and 30cc Marcaine 0.5% with epi injected. Patient sedated. No problems. To OR for GA.  4098-1191 am  Arta Bruce MD

## 2011-05-11 NOTE — H&P (View-Only) (Signed)
Sierra [name] is an 55 y.o. female.   Chief Complaint: right knee pain HPI: Sierra [name] is seen for evaluation after-hours for a right locked knee that occurred when she got up from a sitting position at a meeting. She was unable to straighten her knee and is seen for evaluation of this. She had a right knee ACL reconstruction by me on 02/02/02 with an allograft ACL reconstruction and partial lateral meniscectomy. She was doing well and we have not seen her since 2005. She's been intermittently exercising without problems.  Past Medical History  Diagnosis Date  . Meniere's disease   . Tracheobronchopathia-osteochondroplastica 02/2010    involvement of trachea per FOB  . Normal nuclear stress test     Myoview 2006   . History of Doppler ultrasound     leg negative  2010 right  . History of Doppler ultrasound     right leg 2010 neg    Past Surgical History  Procedure Date  . Myoview 2006    stress- neg  . Doppler echocardiography 2010    rt leg neg  . Skin graft 1963    Family History  Problem Relation Age of Onset  . Uterine cancer Mother     sister  . Asthma Father   . Hyperlipidemia Father   . Macular degeneration Father   . Allergies Father   . Prostate cancer Father   . Multiple sclerosis Sister   . Uterine cancer Sister    Social History:  reports that she has never smoked. She does not have any smokeless tobacco history on file. She reports that she drinks alcohol. She reports that she does not use illicit drugs.  Allergies:  Allergies  Allergen Reactions  . Morphine     Medications Prior to Admission  Medication Sig Dispense Refill  . triamterene-hydrochlorothiazide (MAXZIDE-25) 37.5-25 MG per tablet Take 1 tablet by mouth daily.         No current facility-administered medications on file as of 05/03/2011.    No results found for this or any previous visit (from the past 48 hour(s)). No results found.  Review of Systems  Constitutional: Negative.   HENT:  Negative.   Eyes: Negative.   Respiratory: Negative.   Cardiovascular: Negative.   Gastrointestinal: Negative.   Genitourinary: Negative.   Musculoskeletal: Positive for joint pain.       Right knee pain  Skin: Negative.   Neurological: Negative.   Endo/Heme/Allergies: Negative.   Psychiatric/Behavioral: Negative.     There were no vitals taken for this visit. Physical Exam  Constitutional: She is oriented to person, place, and time. She appears well-developed and well-nourished.  HENT:  Head: Normocephalic and atraumatic.  Eyes: EOM are normal. Pupils are equal, round, and reactive to light.  Neck: Neck supple.  Cardiovascular: Normal rate.   Respiratory: Effort normal.  GI: Soft.  Genitourinary:       Not pertinent to current symptomatology therefore not examined.  Musculoskeletal: She exhibits tenderness.       Examination of her right knee reveals well healed ACL incisions.  No swelling or pain.  Full range of motion.  Knee is stable with normal patellar tracking and mild lateral joint line pain and mildly positive lateral McMurray's.  Examination of her left knee reveals full range of motion without pain, swelling, weakness or instability.  Vascular exam: Pulses are 2+ and symmetric.   Neurological: She is alert and oriented to person, place, and time.  Skin: Skin is warm and  dry.  Psychiatric: She has a normal mood and affect.     Assessment Patient Active Problem List  Diagnoses  . UNSPECIFIED MENIERES DISEASE  . UNSPECIFIED HEARING LOSS  . PHLEBITIS, LOWER EXTREMITY  . PULMONARY NODULE, RIGHT UPPER LOBE  . OTHER DISEASES OF TRACHEA AND BRONCHUS  . ALLERGIC URTICARIA  . HIP PAIN, LEFT  . KNEE PAIN, LEFT  . BACK PAIN, THORACIC REGION  . ANSERINE BURSITIS, LEFT  . METATARSALGIA  . CALF PAIN, RIGHT  . DIZZINESS  . FATIGUE  . SHORTNESS OF BREATH  . CONTUSION OF LOWER LEG  . Easy bruisability  . Precordial pain  . Acute lateral meniscus tear of right knee      Plan I spoke to Ms. Vicars about her right knee MRI which revealed a lateral meniscus tear with intact ACL graft. Initial reading on MRI was that she did not have a meniscal tear but after further review with the radiologist they agreed with me that the posterior horn of the lateral meniscus showed significant irregularity consistent with a lateral meniscus tear. I recommend we proceed with right knee arthroscopy with attention to her meniscal pathology. Discussed risks benefits and possible complications of the surgery in detail and she understands this completely. We'll plan on setting her up for this at some point in the near future.    Hancel Ion J 05/03/2011, 12:04 PM

## 2011-05-11 NOTE — Brief Op Note (Signed)
05/11/2011  12:38 PM  PATIENT:  Sierra [name]  55 y.o. female  PRE-OPERATIVE DIAGNOSIS: Left knee Lateral Meniscus Tear  POST-OPERATIVE DIAGNOSIS: Left knee Lateral Meniscus Tear  PROCEDURE:  Procedure(s): ARTHROSCOPY LEFT  KNEE WITH PARTIAL LATERAL MENISCECTOMY  SURGEON:  Surgeon(s): Nilda Simmer, MD  PHYSICIAN ASSISTANT: Julien Girt PA-C  ASSISTANTS: Julien Girt PA-C   ANESTHESIA:   general  EBL:     BLOOD ADMINISTERED:none  DRAINS: none   LOCAL MEDICATIONS USED:  NONE  SPECIMEN:  No Specimen  DISPOSITION OF SPECIMEN:  N/A  COUNTS:  YES  TOURNIQUET:  * Missing tourniquet times found for documented tourniquets in log:  11855 *  DICTATION: .Other Dictation: Dictation Number [id number]  PLAN OF CARE: Discharge to home after PACU  PATIENT DISPOSITION:  PACU - hemodynamically stable.   Delay start of Pharmacological VTE agent (>24hrs) due to surgical blood loss or risk of bleeding:  NA

## 2011-05-11 NOTE — Interval H&P Note (Signed)
History and Physical Interval Note:  05/11/2011 11:50 AM  Sierra [name]  has presented today for surgery, with the diagnosis of lmt  The various methods of treatment have been discussed with the patient and family. After consideration of risks, benefits and other options for treatment, the patient has consented to  Procedure(s): ARTHROSCOPY RIGHT KNEE as a surgical intervention .  The patients' history has been reviewed, patient examined, no change in status, stable for surgery.  I have reviewed the patients' chart and labs.  Questions were answered to the patient's satisfaction.     Salvatore Marvel A

## 2011-05-14 ENCOUNTER — Encounter (HOSPITAL_BASED_OUTPATIENT_CLINIC_OR_DEPARTMENT_OTHER): Payer: Self-pay | Admitting: Orthopedic Surgery

## 2011-05-14 NOTE — Op Note (Signed)
NAMEJOLA, Sierra [name]             ACCOUNT NO.:  [id number]  MEDICAL RECORD NO.:  [id number]  LOCATION:                                 FACILITY:  PHYSICIAN:  Elana Alm. Thurston Hole, M.D.      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  PROCEDURE:  Right knee EUA, followed by arthroscopic partial lateral meniscectomy with chondroplasty.  SURGEON:  Elana Alm. Thurston Hole, MD  ASSISTANT:  Julien Girt, PA-C  ANESTHESIA:  General.  OPERATIVE TIME:  40 minutes.  COMPLICATIONS:  None.  INDICATION FOR PROCEDURE:  [name] is a 55 year old woman who has had significant right knee pain with intermittent locking and catching in the right knee over the past 8-12 months.  Exam and MRI have revealed an lateral meniscus tear with chondromalacia.  She has failed conservative care and is now here to undergo arthroscopy.  DESCRIPTION OF PROCEDURE:  [name] was brought to the operating room on May 11, 2011, after knee block was placed in the holding room by Anesthesia.  She was placed on the operative table in supine position.  She received Ancef 1 g IV preoperatively for prophylaxis. After being placed under general anesthesia, her right knee was examined.  She had full range of motion.  Knee was stable.  Ligamentous exam with normal patellar tracking.  The right leg was prepped using sterile DuraPrep and draped using sterile technique.  Initially, through an anterolateral portal, the arthroscope with a pump attached was placed into an anteromedial portal, an arthroscopic probe was placed.  On initial inspection of  medial compartment, the articular cartilage showed 25% grade 2 chondromalacia, which was debrided.  Medial meniscus was intact.   Intercondylar notch was inspected.  The anterior cruciate ligament graft was intact and stable.  PCL was intact and stable. Lateral compartment was inspected.  She had grade 1 and 2 chondromalacia.  Lateral meniscus showed a tear of the posterior and lateral horn of which 30-40% was resected back to a stable rim. Popliteus tendon was intact.  Patellofemoral joint showed 30-40% grade 3 chondromalacia, which was debrided.  The patella tracked normally. Medial and lateral gutters showed moderate synovitis, which was debrided; otherwise this free of pathology.  After this was done, it was felt that all pathology have been satisfactorily addressed.  The instruments removed.  Portals closed with 3-0 nylon suture.  Sterile dressings were applied.  The patient awakened and taken to recovery room in stable condition.  FOLLOWUP CARE:  [name] will be followed as an outpatient, on Norco for pain.  She will be seen back in my office in a week for sutures out and followup.     Shawon Denzer A. Thurston Hole, M.D.     RAW/MEDQ  D:  05/11/2011  T:  05/12/2011  Job:  [id number]

## 2011-07-16 ENCOUNTER — Other Ambulatory Visit: Payer: Self-pay | Admitting: Obstetrics & Gynecology

## 2011-07-16 DIAGNOSIS — Z780 Asymptomatic menopausal state: Secondary | ICD-10-CM

## 2011-12-21 ENCOUNTER — Other Ambulatory Visit: Payer: Self-pay | Admitting: Dermatology

## 2012-04-20 IMAGING — CT CT ANGIO CHEST
2 of 6 series · 18 of 36 positions shown · IV contrast (Omnipaque 300)
Comparison: Plain film of 02/21/2010.  No prior CT.

CLINICAL DATA: Short of breath with exertion.  Substernal chest
tightness.  Hematoma right lower extremity from recent fall.

CT ANGIOGRAPHY OF THE CHEST
TECHNIQUE: Multidetector CT angiography of the chest was performed
after contrast with bolus timed to evaluate the pulmonary arteries.
Multiplanar CT image reconstructions including MIPs were obtained
to evaluate the vascular anatomy.
Contrast:  80 ml Rmnipaque-IQQ

[Series 5: thins (id) / (id) · axial · 0.75mm/px · z∈[-279,-27]mm · 17 of 282 slices shown]
[im 15/282  lung]
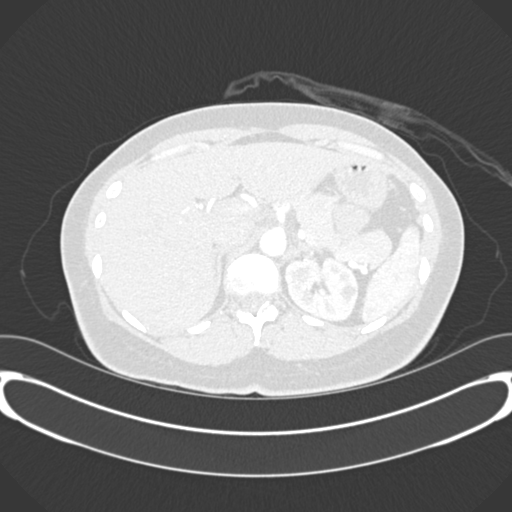
[im 29/282  mediastinal]
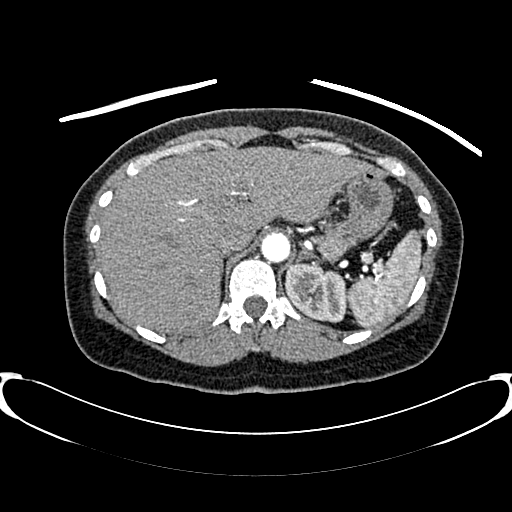
[im 43/282  lung]
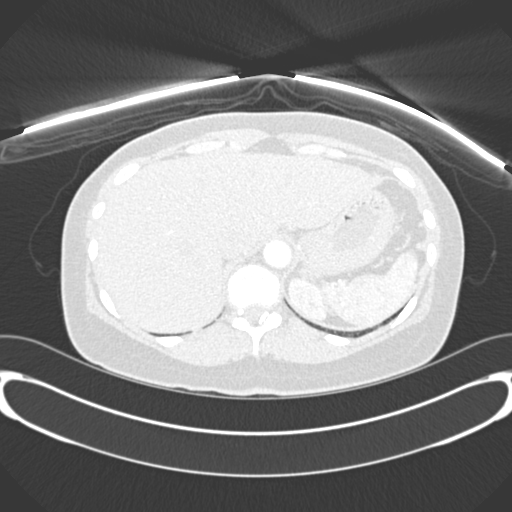
[im 57/282  mediastinal]
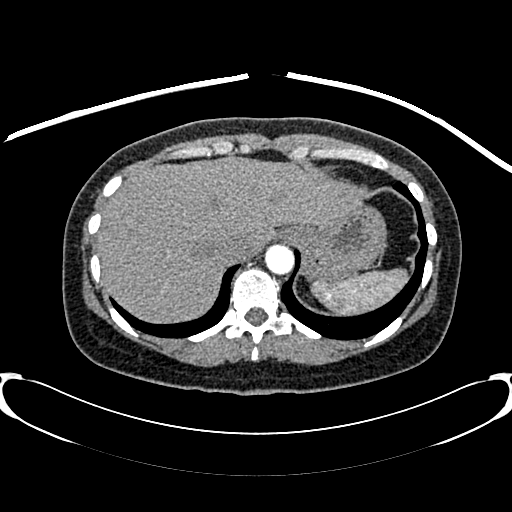
[im 85/282  lung]
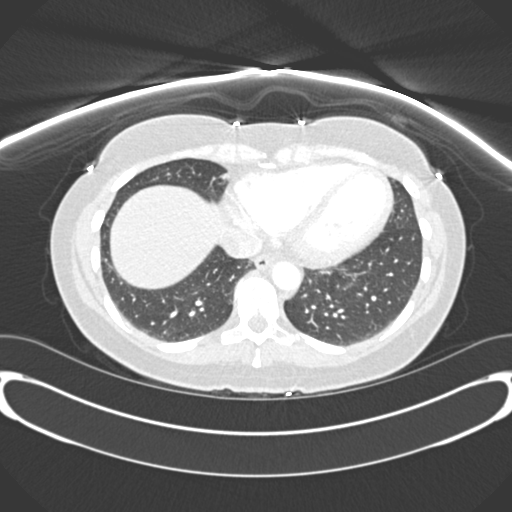
[im 99/282  mediastinal]
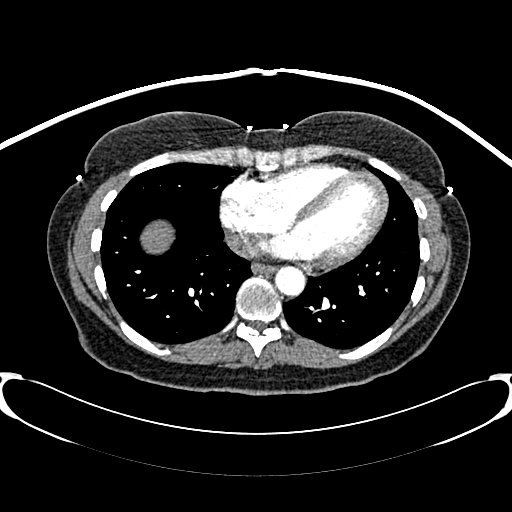
[im 113/282  lung]
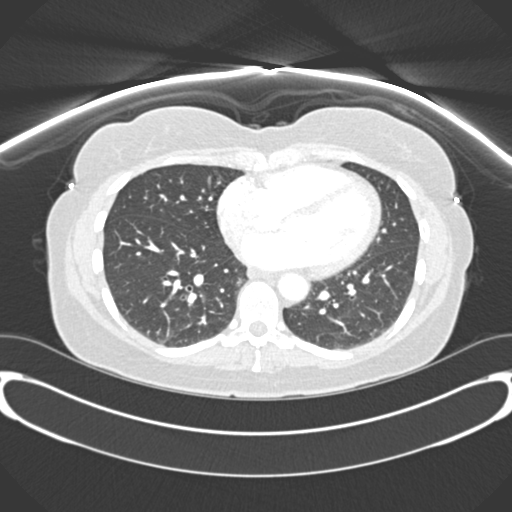
[im 127/282  mediastinal]
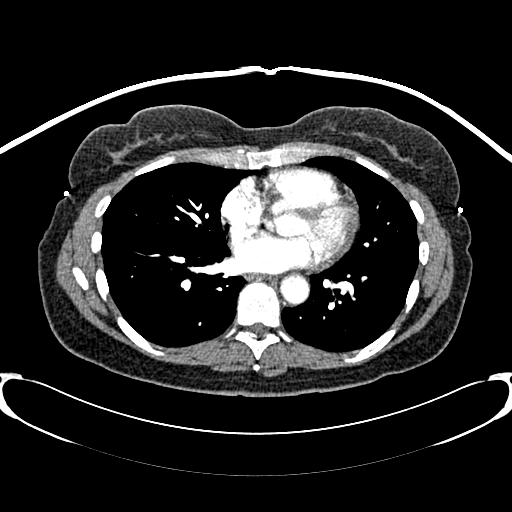
[im 141/282  lung]
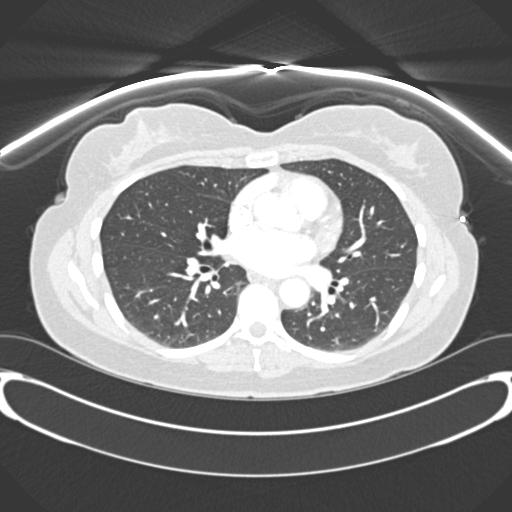
[im 155/282  mediastinal]
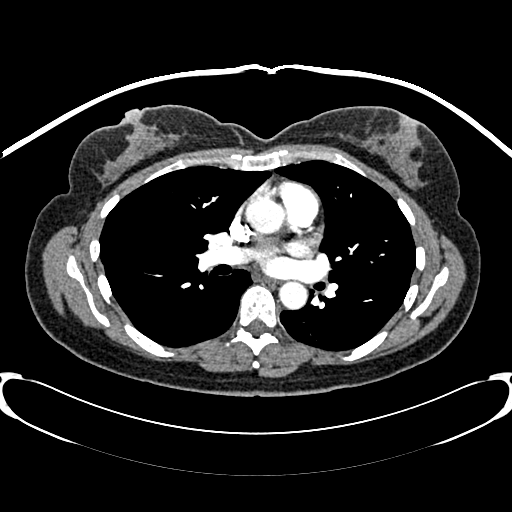
[im 169/282  lung]
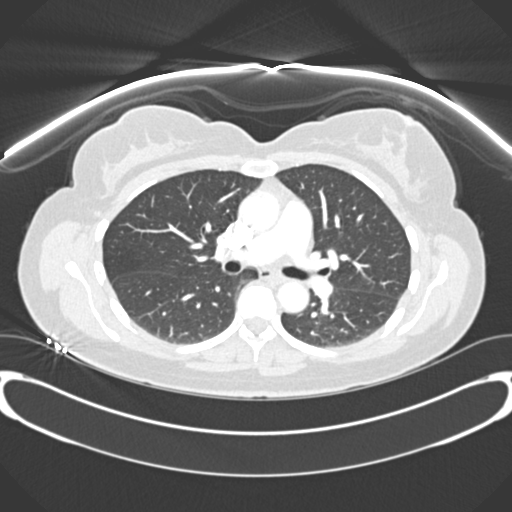
[im 183/282  mediastinal]
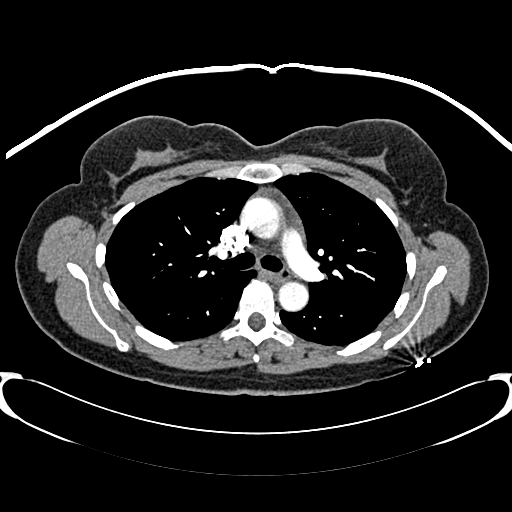
[im 197/282  lung]
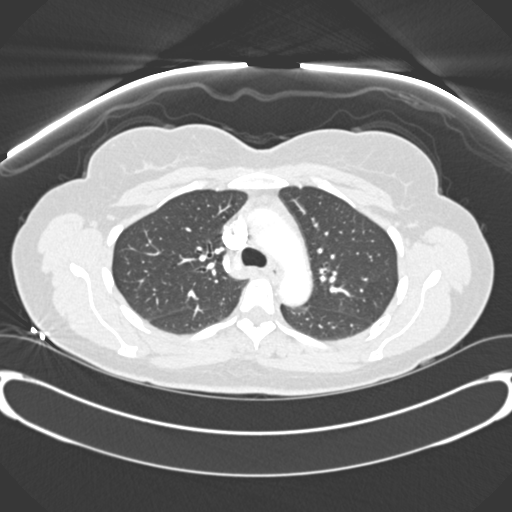
[im 225/282  mediastinal]
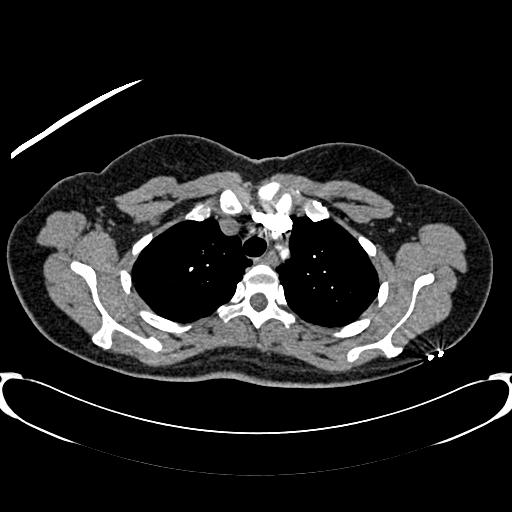
[im 239/282  lung]
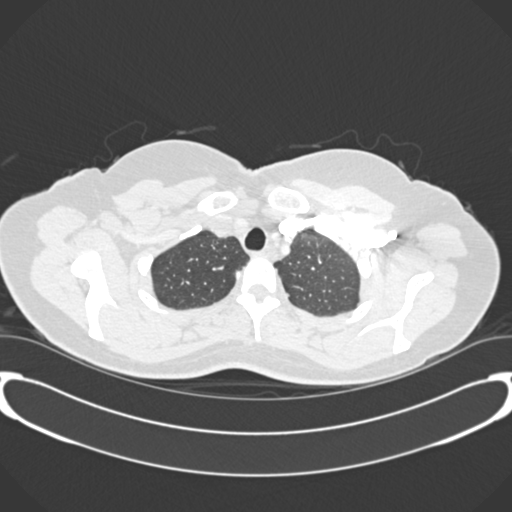
[im 253/282  mediastinal]
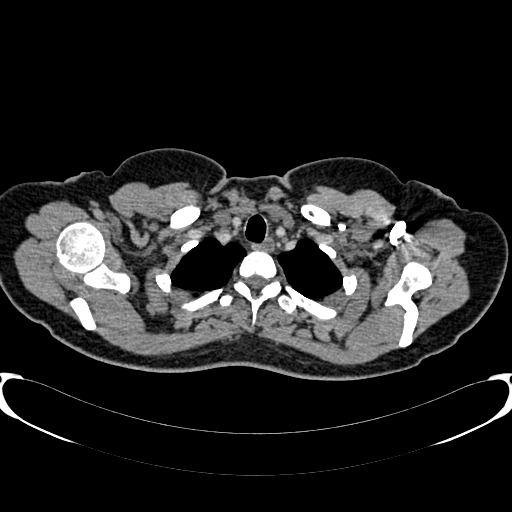
[im 267/282  lung]
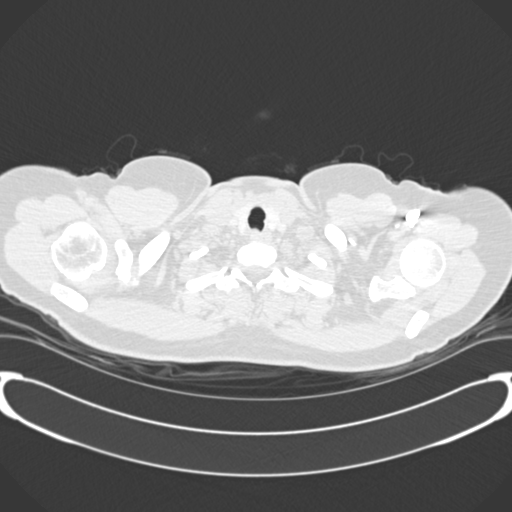

[Series 602: <mpr thick range> · coronal · 0.75mm/px · 1 of 85 slices shown]
[im 43/85  mediastinal]
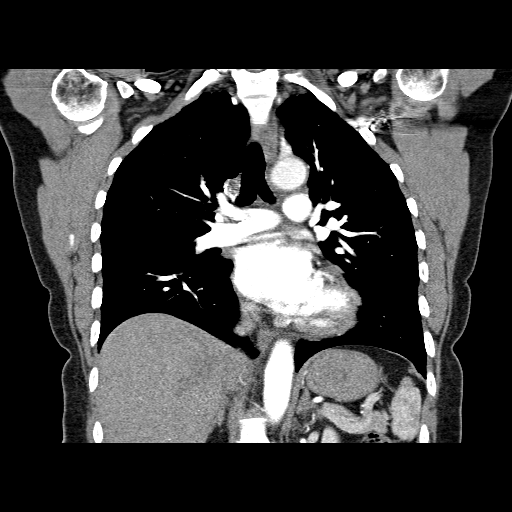

[18 of 36 positions shown; findings below may reference images not displayed]

FINDINGS: Lung windows demonstrate minimal motion artifact.
Numerous tiny foci of soft tissue irregularity within the
nondependent trachea.  Example images 13, 20, and 22 of series 6.

A right apical lung nodule which measures 6 mm on image 11 of
series 6.  This may have minimal spiculation.
Clear left lung.

Soft tissue windows:  The quality of this exam for evaluation of
pulmonary embolism is good to excellent. No evidence of pulmonary
embolism.

Normal aortic caliber without dissection.  A mild pectus excavatum
deformity accentuates cardiac size. No pericardial or pleural
effusion.  No mediastinal or hilar adenopathy. Minimal residual
thymic tissue within the anterior mediastinum on image 26.

Limited abdominal imaging demonstrates no significant findings.  No
acute osseous abnormality.

 Review of the MIP images confirms the above findings.
IMPRESSION: 1. No evidence of pulmonary embolism.
2.  6 mm right apical lung nodule. If the patient is at high risk
for bronchogenic carcinoma, follow-up chest CT at 6-12 months is
recommended.  If the patient is at low risk for bronchogenic
carcinoma, follow-up chest CT at 12 months is recommended.  This
recommendation follows the consensus statement: "Guidelines for
Management of Small Pulmonary Nodules Detected on CT Scans: A
Statement from the [HOSPITAL]" as published in Radiology
4004; [DATE]. Online at:
[URL]
3.  Irregularity along the nondependent trachea.  This may relate
to retained secretions.  This could be reevaluated on follow-up
chest CT.  If persistent, tracheal papillomatosis would also be a
consideration.

This study was a "call report"

## 2012-08-22 ENCOUNTER — Ambulatory Visit (INDEPENDENT_AMBULATORY_CARE_PROVIDER_SITE_OTHER): Payer: BC Managed Care – PPO | Admitting: Internal Medicine

## 2012-08-22 ENCOUNTER — Telehealth: Payer: Self-pay | Admitting: Internal Medicine

## 2012-08-22 ENCOUNTER — Encounter: Payer: Self-pay | Admitting: Internal Medicine

## 2012-08-22 VITALS — BP 110/60 | HR 72 | Temp 98.2°F | Wt 149.0 lb

## 2012-08-22 DIAGNOSIS — H8109 Meniere's disease, unspecified ear: Secondary | ICD-10-CM

## 2012-08-22 DIAGNOSIS — R42 Dizziness and giddiness: Secondary | ICD-10-CM | POA: Insufficient documentation

## 2012-08-22 DIAGNOSIS — R0602 Shortness of breath: Secondary | ICD-10-CM

## 2012-08-22 DIAGNOSIS — R079 Chest pain, unspecified: Secondary | ICD-10-CM | POA: Insufficient documentation

## 2012-08-22 LAB — LDL CHOLESTEROL, DIRECT: Direct LDL: 112.2 mg/dL

## 2012-08-22 LAB — CBC WITH DIFFERENTIAL/PLATELET
Basophils Absolute: 0 10*3/uL (ref 0.0–0.1)
Hemoglobin: 14.2 g/dL (ref 12.0–15.0)
Lymphocytes Relative: 16.9 % (ref 12.0–46.0)
Monocytes Relative: 4.3 % (ref 3.0–12.0)
Neutrophils Relative %: 77.3 % — ABNORMAL HIGH (ref 43.0–77.0)
Platelets: 309 10*3/uL (ref 150.0–400.0)
RDW: 13.6 % (ref 11.5–14.6)

## 2012-08-22 LAB — BASIC METABOLIC PANEL
BUN: 20 mg/dL (ref 6–23)
Calcium: 9.7 mg/dL (ref 8.4–10.5)
Creatinine, Ser: 0.7 mg/dL (ref 0.4–1.2)
GFR: 90.08 mL/min (ref >60.00)
Glucose, Bld: 95 mg/dL (ref 70–99)
Sodium: 137 mEq/L (ref 135–145)

## 2012-08-22 LAB — HEMOGLOBIN A1C: Hgb A1c MFr Bld: 5.9 % (ref 4.6–6.5)

## 2012-08-22 LAB — TSH: TSH: 1.12 u[IU]/mL (ref 0.35–5.50)

## 2012-08-22 LAB — HEPATIC FUNCTION PANEL
ALT: 27 U/L (ref 0–35)
Total Protein: 7.5 g/dL (ref 6.0–8.3)

## 2012-08-22 LAB — T4, FREE: Free T4: 0.99 ng/dL (ref 0.60–1.60)

## 2012-08-22 LAB — LIPID PANEL
HDL: 95.5 mg/dL (ref >39.00)
Total CHOL/HDL Ratio: 2

## 2012-08-22 NOTE — Telephone Encounter (Signed)
Patient Information:  Caller Name: Zoya  Phone: [phone]  Patient: Sierra [name]  Gender: Female  DOB: 11-13-1955  Age: 57 Years  PCP: Berniece Andreas (Family Practice)  Office Follow Up:  Does the office need to follow up with this patient?: Yes  Instructions For The Office: Sending over for evaluation of "Heartburn spreading across shoulders" Lightheaded and dizziness . voice clear and calm.   Approved by Misty/Office  RN Note:  spoke with [place] in the office. Appt scheduled at 10:45 with Dr. Fabian Sharp. Care advise and call back parameters reviewed. Patient expressed understanding.  Symptoms  Reason For Call & Symptoms: She states she awoke with heartburn this morning "extreme". She has never had Heartburn before. She cancelled her exercise class.  "spreading across her chest".  Upper chest and across shoulders. She treated herself with tums and protonix. She did feel better.  She did go workout felt lightheaded and dizzy, easily fatigued and slightly more short of breath.  She still feels a little lightheaded.  Reviewed Health History In EMR: Yes  Reviewed Medications In EMR: Yes  Reviewed Allergies In EMR: Yes  Reviewed Surgeries / Procedures: Yes  Date of Onset of Symptoms: 08/22/2012  Treatments Tried: Tums and protonix  Treatments Tried Worked: Yes  Guideline(s) Used:  Chest Pain  Disposition Per Guideline:   Go to ED Now (or to Office with PCP Approval)  Reason For Disposition Reached:   Dizziness or lightheadedness  Advice Given:  Call Back If:  Severe chest pain  Constant chest pain lasting longer than 5 minutes  Difficulty breathing  You become worse.  Patient Will Follow Care Advice:  YES  Appointment Scheduled:  08/22/2012 10:45:00 Appointment Scheduled Provider:  Berniece Andreas [facility])

## 2012-08-22 NOTE — Progress Notes (Signed)
Chief Complaint  Patient presents with  . Shoulder Pain  . Neck Pain  . Chest Pain  . Dizziness  . Shortness of Breath    HPI: Patient comes in today for SDA for  new problem evaluation. Patient was in her usual state of good health when early this morning she had mid upper chest pain that was somewhat severe without associated symptoms did radiate to the upper chest without associated shortness of breath or cough. She eventually took Lutsen and then some leftover protonix which helped the discomfort within 30 minutes..      She went on to exercise as she usually does in the morning has a protein type shake trying to lose weight for her class reunion in May. And felt somewhat lightheaded when she was trying to do her exercise but no chest pain shortness of breath at that time.  She has Mnire's disease and is taking diuretic pill every day occasionally forgets but is not forgotten more than 2 days and aerobically she gets dizzy symptoms.  Does not believe she has had laboratory monitoring for the last year. No racing heart syncope vision change.   She had a cardiovascular pulmonary evaluation  Dr Jones Broom about 2 years ago for some shortness of breath abnormal chest x-ray and was hasn't followed up with the record says see her in 6 months. She did not have pulmonary hypertension and good exercise stress test but on an echocardiogram but did have grade 2 diastolic dysfunction.  ROS: See pertinent positives and negatives per HPI.  Past Medical History  Diagnosis Date  . Meniere's disease   . Tracheobronchopathia-osteochondroplastica 02/2010    involvement of trachea per FOB  . Normal nuclear stress test     Myoview 2006   . History of Doppler ultrasound     leg negative  2010 right  . History of Doppler ultrasound     right leg 2010 neg  . Shortness of breath     Family History  Problem Relation Age of Onset  . Uterine cancer Mother     sister  . Asthma Father   .  Hyperlipidemia Father   . Macular degeneration Father   . Allergies Father   . Prostate cancer Father   . Multiple sclerosis Sister   . Uterine cancer Sister     History   Social History  . Marital Status: Married    Spouse Name: N/A    Number of Children: N/A  . Years of Education: N/A   Social History Main Topics  . Smoking status: Never Smoker   . Smokeless tobacco: None  . Alcohol Use: Yes     Comment: socially 1-2 a month  . Drug Use: No  . Sexually Active:    Other Topics Concern  . 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 estradiol (ESTRACE) 0.1 MG/GM vaginal cream Place 2 g vaginally daily. Using 2-3 times weekly      . triamterene-hydrochlorothiazide (MAXZIDE-25) 37.5-25 MG per tablet Take 1 tablet by mouth daily.       . [DISCONTINUED] HYDROcodone-acetaminophen (NORCO) 5-325 MG per tablet 1-2 tab po q 4-6 hrs prn pain  40 tablet  0   No facility-administered encounter medications on file as of 08/22/2012.    EXAM:  BP 110/60  Pulse 72  Temp(Src) 98.2 F (36.8 C) (Oral)  Wt 149 lb (67.586 kg)  BMI 24.79 kg/m2  SpO2 98%  Body mass index is 24.79 kg/(m^2). C. readings that are orthostatic by pulse remains the same without symptom GENERAL: vitals reviewed and listed above, alert, oriented, appears well hydrated and in no acute distress quiet respirations  HEENT: atraumatic, conjunctiva  clear, no obvious abnormalities on inspection of external nose and ears OP : no lesion edema or exudate   NECK: no obvious masses on inspection palpation no adenopathy no bruit no JVD  LUNGS: clear to auscultation bilaterally, no wheezes, rales or rhonchi, good air movement  CV: HRRR, no gallops murmurs or rubs no clubbing cyanosis or  peripheral edema nl cap refill  Abdomen soft without hepatomegaly guarding or  rebound MS: moves all extremities without noticeable focal  abnormality Skin no acute changes bruising bleeding PSYCH: pleasant and cooperative, no obvious depression or anxiety EKG shows normal sinus rhythm no acute changes ASSESSMENT AND PLAN:  Discussed the following assessment and plan:  Chest pain - Plan: estradiol (ESTRACE) 0.1 MG/GM vaginal cream, EKG 12-Lead, Basic metabolic panel, CBC with Differential, Hemoglobin A1c, Hepatic function panel, TSH, T4, free, Magnesium, Lipid panel, Ambulatory referral to Cardiology  SOB (shortness of breath) - Plan: estradiol (ESTRACE) 0.1 MG/GM vaginal cream, EKG 12-Lead, Basic metabolic panel, CBC with Differential, Hemoglobin A1c, Hepatic function panel, TSH, T4, free, Magnesium, Lipid panel, Ambulatory referral to Cardiology  Dizzy - Lightheaded with exercise today - Plan: estradiol (ESTRACE) 0.1 MG/GM vaginal cream, EKG 12-Lead, Basic metabolic panel, CBC with Differential, Hemoglobin A1c, Hepatic function panel, TSH, T4, free, Magnesium, Lipid panel, Ambulatory referral to Cardiology  UNSPECIFIED MENIERES DISEASE - On daily diuretic therapy - Plan: estradiol (ESTRACE) 0.1 MG/GM vaginal cream, EKG 12-Lead, Basic metabolic panel, CBC with Differential, Hemoglobin A1c, Hepatic function panel, TSH, T4, free, Magnesium, Lipid panel Her exam is reassuring today as well as her EKG her chest symptoms sounded more typical of reflux based on contacts. However she did have lightheadedness later with exercise. Interesting she does have orthostatic drop that could be significant although has no tachycardia. She is on a diuretic and dieting at this time but not skipping meals having protein shakes in the morning.  She had a rather extensive workup in 2012 but did not followup last year as cardiology had recommended in the chart but has been doing quite well.  She states she hasn't really missed many of her diuretic doses that might cause side effects. Caffeine to  about the same and no significant alcohol related to above  We'll check labs today  stay on proton next once a day and followup would like cardiology to see you next week however if your symptoms get worse or progressive contact on closer is in the meantime. -Patient advised to return or notify health care team  if symptoms worsen or persist or new concerns arise.  Patient Instructions  Your EKG is normal and exam is normal today her blood pressure does drop some when you stand up but her pulse remains the same.  The medication you're on for your ears can cause electrolyte disturbance so we are getting laboratory tests today  Your chest discomfort does sound like reflux by context and I what she did take proton iX. one a day for the next 2 weeks  However because of the exercise related symptoms  I would like cardiology to see you again next week.  He saw Dr. Sampson Goon in the  past and I would like you to see him I believe you were supposed to see him in followup in 2013  You're having recurrent severe chest pain or other problems over the weekend contact the on-call  service for advice ;stay well-hydrated.     Neta Mends. Panosh M.D.

## 2012-08-22 NOTE — Patient Instructions (Addendum)
Your EKG is normal and exam is normal today her blood pressure does drop some when you stand up but her pulse remains the same.  The medication you're on for your ears can cause electrolyte disturbance so we are getting laboratory tests today  Your chest discomfort does sound like reflux by context and I what she did take proton iX. one a day for the next 2 weeks  However because of the exercise related symptoms  I would like cardiology to see you again next week.  He saw Dr. Sampson Goon in the past and I would like you to see him I believe you were supposed to see him in followup in 2013  You're having recurrent severe chest pain or other problems over the weekend contact the on-call  service for advice ;stay well-hydrated.

## 2012-08-29 ENCOUNTER — Ambulatory Visit (INDEPENDENT_AMBULATORY_CARE_PROVIDER_SITE_OTHER): Payer: BC Managed Care – PPO | Admitting: Nurse Practitioner

## 2012-08-29 ENCOUNTER — Encounter: Payer: Self-pay | Admitting: Nurse Practitioner

## 2012-08-29 VITALS — BP 108/68 | HR 60 | Ht 64.75 in | Wt 149.4 lb

## 2012-08-29 DIAGNOSIS — R911 Solitary pulmonary nodule: Secondary | ICD-10-CM

## 2012-08-29 DIAGNOSIS — I5189 Other ill-defined heart diseases: Secondary | ICD-10-CM

## 2012-08-29 DIAGNOSIS — R0989 Other specified symptoms and signs involving the circulatory and respiratory systems: Secondary | ICD-10-CM

## 2012-08-29 DIAGNOSIS — R0609 Other forms of dyspnea: Secondary | ICD-10-CM

## 2012-08-29 DIAGNOSIS — R0600 Dyspnea, unspecified: Secondary | ICD-10-CM

## 2012-08-29 DIAGNOSIS — I519 Heart disease, unspecified: Secondary | ICD-10-CM

## 2012-08-29 DIAGNOSIS — R079 Chest pain, unspecified: Secondary | ICD-10-CM

## 2012-08-29 NOTE — Progress Notes (Signed)
Sierra [name] Date of Birth: 19-Feb-1956 Medical Record #[id number]  History of Present Illness: Sierra [name] is seen back today for a follow up visit. She is seen for Sierra [name]. She was last seen here in August of 2012. Has had past history of exertional dyspnea with a negative evaluation that included normal echo and stress echo except for grade 2 diastolic dysfunction, EF of 60%. No PAH. Completely normal CXP in November of 2011. Has had past lung nodule noted on CT with repeat scan in 2013 showing stability with repeat evaluation in 12 to 18 months. She has seen Dr. Delford Field for this in the past.   Seen by her PCP with chest pain. Negative EKG. Was referred back here. Seemed to be more related to reflux.   She comes in today. She is here alone. She has not been seen since August of 2012 and has basically done ok since that time. Will get some rare dyspnea but for the most part has felt ok. Over the past month she has really "ramped" up her exercise program. Trying to lose weight for a reunion in June. Has been doing spin classes, working with a trainer and walking 4 miles. Very frustrated that she has not really lost weight. Diet is really not that bad. On last Friday, she was going to do her spin class but noted this burning in her upper chest/neck that radiated to the shoulders. She felt out of breath. Was a little dizzy and just felt "off". Took a TUMS and then a Prevacid. Tried to go walk later that day but just felt fatigued. Has done ok since and is back to her regular routine without any issue. Has not had a follow up CT since 2012. Her family history is negative for CAD. Lipids aren't that bad. HLD is 95 with an LDL of 112. No smoking history. No HTN.   Current Outpatient Prescriptions on File Prior to Visit  Medication Sig Dispense Refill  . estradiol (ESTRACE) 0.1 MG/GM vaginal cream Place 2 g vaginally daily. Using 2-3 times weekly      . triamterene-hydrochlorothiazide  (MAXZIDE-25) 37.5-25 MG per tablet Take 1 tablet by mouth daily.        No current facility-administered medications on file prior to visit.    Allergies  Allergen Reactions  . Morphine   . Tributyl Phosphate Hives    Past Medical History  Diagnosis Date  . Meniere's disease     on diuretic therapy  . Tracheobronchopathia-osteochondroplastica 02/2010    involvement of trachea per FOB  . Normal nuclear stress test     Myoview 2006   . History of Doppler ultrasound     leg negative  2010 right  . History of Doppler ultrasound     right leg 2010 neg  . Shortness of breath     negative echo and stress echo 11/11 except for grade 2 diastolic dysfunction. EF is 60%. Normal CPX 11/11    Past Surgical History  Procedure Laterality Date  . Myoview  2006    stress- neg  . Doppler echocardiography  2010    rt leg neg  . Skin graft  1963  . Knee arthroscopy  2003    right  . Bronchoscopy  10/11  . Knee arthroscopy  05/11/2011    Procedure: ARTHROSCOPY KNEE;  Surgeon: Nilda Simmer, MD;  Location: [facility];  Service: Orthopedics;  Laterality: Right;  with lateral meniscectomy    History  Smoking status  . Never Smoker   Smokeless tobacco  . Not on file    History  Alcohol Use  . Yes    Comment: socially 1-2 a month    Family History  Problem Relation Age of Onset  . Uterine cancer Mother     sister  . Asthma Father   . Hyperlipidemia Father   . Macular degeneration Father   . Allergies Father   . Prostate cancer Father   . Multiple sclerosis Sister   . Uterine cancer Sister     Review of Systems: The review of systems is per the HPI.  All other systems were reviewed and are negative.  Physical Exam: BP 108/68  Pulse 60  Ht 5' 4.75" (1.645 m)  Wt 149 lb 6.4 oz (67.767 kg)  BMI 25.04 kg/m2 Patient is very pleasant and in no acute distress. Skin is warm and dry. Color is normal.  HEENT is unremarkable. Normocephalic/atraumatic. PERRL.  Sclera are nonicteric. Neck is supple. No masses. No JVD. Lungs are clear. Cardiac exam shows a regular rate and rhythm. Abdomen is soft. Extremities are without edema. Gait and ROM are intact. No gross neurologic deficits noted.   LABORATORY DATA:  Lab Results  Component Value Date   WBC 8.2 08/22/2012   HGB 14.2 08/22/2012   HCT 41.8 08/22/2012   PLT 309.0 08/22/2012   GLUCOSE 95 08/22/2012   CHOL 218* 08/22/2012   TRIG 67.0 08/22/2012   HDL 95.50 08/22/2012   LDLDIRECT 112.2 08/22/2012   LDLCALC 103* 10/13/2007   ALT 27 08/22/2012   AST 27 08/22/2012   NA 137 08/22/2012   K 3.6 08/22/2012   CL 98 08/22/2012   CREATININE 0.7 08/22/2012   BUN 20 08/22/2012   CO2 29 08/22/2012   TSH 1.12 08/22/2012   INR 1.2* 09/21/2010   HGBA1C 5.9 08/22/2012   Echo Study Conclusions from 2011  - Left ventricle: The cavity size was normal. Wall thickness was normal. Systolic function was normal. The estimated ejection fraction was 60%. Wall motion was normal; there were no regional wall motion abnormalities. Features are consistent with a pseudonormal left ventricular filling pattern, with concomitant abnormal relaxation and increased filling pressure (grade 2 diastolic dysfunction). - Left atrium: The atrium was mildly dilated.   CT CHEST IMPRESSION: April 2012  1. Stable right upper lobe pulmonary nodule at 6 mm. This represents 7 months of stability. A follow-up chest CT in 12-18 month's time is recommended. 2. Sessile, plaque-like nodularity along the trachea appear stable. No associated calcification to favor tracheobronchopathia osteochondroplastica. The pathologic assessment these lesions reportedly showed reactive bronchial epithelial cells, without granuloma or malignancy.  Original Report Authenticated By: Dellia Cloud, M.D.   Assessment / Plan: 1. Atypical chest pain - sounds more Gi related. Will update her stress echo.   2. Meniere's disease - on chronic diuretic  therapy.  3. Right lung nodule & known trach abnormality - needs to get her CT updated.   4. Grade 2 diastolic dysfunction on past echo - will update her echo.   Further disposition to follow once her studies are complete.   Patient is agreeable to this plan and will call if any problems develop in the interim.   Rosalio Macadamia, RN, ANP-C [facility] [address] [place], [place]  [address]

## 2012-08-29 NOTE — Patient Instructions (Addendum)
We will arrange for a repeat CT of your chest (without contrast)  We will arrange for an echo and a stress echo  For now, stay on your current medicines  Call the [facility] office at [phone] if you have any questions, problems or concerns.

## 2012-09-02 ENCOUNTER — Ambulatory Visit (INDEPENDENT_AMBULATORY_CARE_PROVIDER_SITE_OTHER)
Admission: RE | Admit: 2012-09-02 | Discharge: 2012-09-02 | Disposition: A | Discharge status: Home / Self Care | Payer: BC Managed Care – PPO | Source: Ambulatory Visit | Attending: Nurse Practitioner | Admitting: Nurse Practitioner

## 2012-09-02 DIAGNOSIS — I5189 Other ill-defined heart diseases: Secondary | ICD-10-CM

## 2012-09-02 DIAGNOSIS — R911 Solitary pulmonary nodule: Secondary | ICD-10-CM

## 2012-09-02 DIAGNOSIS — I519 Heart disease, unspecified: Secondary | ICD-10-CM

## 2012-09-02 DIAGNOSIS — R0600 Dyspnea, unspecified: Secondary | ICD-10-CM

## 2012-09-02 DIAGNOSIS — R0989 Other specified symptoms and signs involving the circulatory and respiratory systems: Secondary | ICD-10-CM

## 2012-09-02 DIAGNOSIS — R079 Chest pain, unspecified: Secondary | ICD-10-CM

## 2012-09-03 ENCOUNTER — Telehealth: Payer: Self-pay | Admitting: Critical Care Medicine

## 2012-09-03 ENCOUNTER — Telehealth: Payer: Self-pay | Admitting: *Deleted

## 2012-09-03 NOTE — Telephone Encounter (Signed)
lmtcb x1 for Dr. Dellia Cloud.

## 2012-09-03 NOTE — Telephone Encounter (Signed)
S/w pt's husband, Dr. Dellia Cloud called the office after I gave pt results from ct of the chest and said pt was freaked out because they didn't know the other nodule was there.  I stated the name of the reader on the report and that I sent a copy to the pcp and Dr. Danise Mina, pt's pulmon logy dr.  Dr. Dellia Cloud said he would contact the other offices and discuss the report with them

## 2012-09-04 ENCOUNTER — Telehealth: Payer: Self-pay | Admitting: Critical Care Medicine

## 2012-09-04 NOTE — Telephone Encounter (Signed)
Reviewed CTs with the pt     No real change in CT since 2011.  Pt to follow anti reflux program and get stress test

## 2012-09-05 NOTE — Telephone Encounter (Signed)
i spoke to him already

## 2012-09-05 NOTE — Telephone Encounter (Signed)
Dr. Delford Field, Dr. Dellia Cloud is requesting to speak with you about his wife. He left contact number as [phone]. Carron Curie, CMA

## 2012-09-08 ENCOUNTER — Other Ambulatory Visit: Payer: Self-pay | Admitting: *Deleted

## 2012-09-08 DIAGNOSIS — R079 Chest pain, unspecified: Secondary | ICD-10-CM

## 2012-09-11 ENCOUNTER — Other Ambulatory Visit (HOSPITAL_COMMUNITY): Payer: BC Managed Care – PPO

## 2012-09-16 ENCOUNTER — Ambulatory Visit: Payer: BC Managed Care – PPO | Admitting: Cardiovascular Disease

## 2012-09-16 ENCOUNTER — Ambulatory Visit (HOSPITAL_COMMUNITY): Payer: BC Managed Care – PPO | Attending: Cardiology

## 2012-09-16 DIAGNOSIS — I379 Nonrheumatic pulmonary valve disorder, unspecified: Secondary | ICD-10-CM | POA: Insufficient documentation

## 2012-09-16 DIAGNOSIS — R911 Solitary pulmonary nodule: Secondary | ICD-10-CM

## 2012-09-16 DIAGNOSIS — R0609 Other forms of dyspnea: Secondary | ICD-10-CM | POA: Insufficient documentation

## 2012-09-16 DIAGNOSIS — R0989 Other specified symptoms and signs involving the circulatory and respiratory systems: Secondary | ICD-10-CM | POA: Insufficient documentation

## 2012-09-16 DIAGNOSIS — I079 Rheumatic tricuspid valve disease, unspecified: Secondary | ICD-10-CM | POA: Insufficient documentation

## 2012-09-16 DIAGNOSIS — R079 Chest pain, unspecified: Secondary | ICD-10-CM

## 2012-09-16 DIAGNOSIS — J984 Other disorders of lung: Secondary | ICD-10-CM | POA: Insufficient documentation

## 2012-09-16 DIAGNOSIS — I059 Rheumatic mitral valve disease, unspecified: Secondary | ICD-10-CM | POA: Insufficient documentation

## 2012-09-16 DIAGNOSIS — I5189 Other ill-defined heart diseases: Secondary | ICD-10-CM

## 2012-09-16 DIAGNOSIS — R072 Precordial pain: Secondary | ICD-10-CM | POA: Insufficient documentation

## 2012-09-16 DIAGNOSIS — R0600 Dyspnea, unspecified: Secondary | ICD-10-CM

## 2012-09-16 NOTE — Progress Notes (Signed)
Echocardiogram performed.  

## 2012-10-01 ENCOUNTER — Encounter: Payer: BC Managed Care – PPO | Admitting: Nurse Practitioner

## 2012-10-24 ENCOUNTER — Ambulatory Visit (INDEPENDENT_AMBULATORY_CARE_PROVIDER_SITE_OTHER): Payer: BC Managed Care – PPO | Admitting: Physician Assistant

## 2012-10-24 DIAGNOSIS — R079 Chest pain, unspecified: Secondary | ICD-10-CM

## 2012-10-24 DIAGNOSIS — R0602 Shortness of breath: Secondary | ICD-10-CM

## 2012-10-24 NOTE — Progress Notes (Signed)
Exercise Treadmill Test  Pre-Exercise Testing Evaluation Rhythm: normal sinus  Rate: 70     Test  Exercise Tolerance Test Ordering MD: Norma Fredrickson, NP  Interpreting [id number] Alben Spittle, PA-C  Unique Test No: 1  Treadmill:  1  Indication for ETT: chest pain - rule out ischemia  Contraindication to ETT: No   Stress Modality: exercise - treadmill  Cardiac Imaging Performed: non   Protocol: standard Bruce - maximal  Max BP:  132/70  Max MPHR (bpm):163 85% MPR (bpm): 139  MPHR obtained (bpm):  160 % MPHR obtained:  98  Reached 85% MPHR (min:sec):  8:34 Total Exercise Time (min-sec):  12:00  Workload in METS:  13.4 Borg Scale: 14  Reason ETT Terminated:  desired heart rate attained    ST Segment Analysis At Rest: normal ST segments - no evidence of significant ST depression With Exercise: non-specific ST changes  Other Information Arrhythmia:  No Angina during ETT:  absent (0) Quality of ETT:  diagnostic  ETT Interpretation:  normal - no evidence of ischemia by ST analysis  Comments: Excellent exercise tolerance. No chest pain. Normal BP response to exercise. No significant ST-T changes to suggest ischemia.   Recommendations: F/u with Norma Fredrickson, NP as directed. Signed,  Tereso Newcomer, PA-C   10/24/2012 9:44 AM

## 2012-11-12 ENCOUNTER — Encounter: Payer: Self-pay | Admitting: Internal Medicine

## 2012-11-12 ENCOUNTER — Ambulatory Visit (INDEPENDENT_AMBULATORY_CARE_PROVIDER_SITE_OTHER): Payer: BC Managed Care – PPO | Admitting: Internal Medicine

## 2012-11-12 VITALS — BP 110/84 | HR 74 | Temp 98.6°F | Wt 151.0 lb

## 2012-11-12 DIAGNOSIS — H9202 Otalgia, left ear: Secondary | ICD-10-CM

## 2012-11-12 DIAGNOSIS — H9209 Otalgia, unspecified ear: Secondary | ICD-10-CM

## 2012-11-12 DIAGNOSIS — H8109 Meniere's disease, unspecified ear: Secondary | ICD-10-CM

## 2012-11-12 NOTE — Progress Notes (Signed)
Chief Complaint  Patient presents with  . Otalgia    Started yesterday.    HPI: Patient comes in today for SDA for  new problem evaluation. She has had one day history of left ear pain described as soreness touching the front of her ear. Although it may radiate into the back of her throat on the left. She would've normally waited longer to come in but she is going out of town to [place] and then [place] and be gone for a while.  She denies any change in her hearing dizziness she is on therapy for her Mnire's disease. She has no cold symptoms fever swollen glands.  Update she did have a stress test which was very normal with exercise tolerance excellent after the last visit for evaluation of atypical chest pain.  She states she did get a little dizzy and lightheadedness afterwords. No teeth gritting no pain with swallowing now much no jaw clicking ROS: See pertinent positives and negatives per HPI.  Past Medical History  Diagnosis Date  . Meniere's disease     on diuretic therapy  . Tracheobronchopathia-osteochondroplastica 02/2010    involvement of trachea per FOB  . Normal nuclear stress test     Myoview 2006   . History of Doppler ultrasound     leg negative  2010 right  . History of Doppler ultrasound     right leg 2010 neg  . Shortness of breath     negative echo and stress echo 11/11 except for grade 2 diastolic dysfunction. EF is 60%. Normal CPX 11/11    Family History  Problem Relation Age of Onset  . Uterine cancer Mother     sister  . Asthma Father   . Hyperlipidemia Father   . Macular degeneration Father   . Allergies Father   . Prostate cancer Father   . Multiple sclerosis Sister   . Uterine cancer Sister     History   Social History  . Marital Status: Married    Spouse Name: N/A    Number of Children: N/A  . Years of Education: N/A   Social History Main Topics  . Smoking status: Never Smoker   . Smokeless tobacco: None  . Alcohol Use: Yes   Comment: socially 1-2 a month  . Drug Use: No  . Sexually Active: Yes   Other Topics Concern  . 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 estradiol (ESTRACE) 0.1 MG/GM vaginal cream Place 2 g vaginally daily. Using 2-3 times weekly      . meloxicam (MOBIC) 15 MG tablet       . triamterene-hydrochlorothiazide (MAXZIDE-25) 37.5-25 MG per tablet Take 1 tablet by mouth daily.        No facility-administered encounter medications on file as of 11/12/2012.    EXAM:  BP 110/84  Pulse 74  Temp(Src) 98.6 F (37 C) (Oral)  Wt 151 lb (68.493 kg)  BMI 25.31 kg/m2  SpO2 97%  Body mass index is 25.31 kg/(m^2).  GENERAL: vitals reviewed and listed above, alert, oriented, appears well hydrated and in no acute distress  HEENT: atraumatic, conjunctiva  clear, no obvious abnormalities on inspection of external nose and ears left EAC is clear slight tenderness when touching the tragus. But negative TMJ tenderness. No boils in the canal little waxed  and non-TM intact with no acute changes. OP : no lesion edema or exudate no significant lesions minimal erythema if any  NECK: no obvious masses on inspection palpation no adenopathy tenderness. MS: moves all extremities without noticeable focal  abnormality ASSESSMENT AND PLAN:  Discussed the following assessment and plan:  Otalgia of left ear - Recent onset may be referred no her normalities on exam of significance  UNSPECIFIED MENIERES DISEASE I do not think this is a middle or inner ears significantly problem could be radiating from the throat external canal but nothing to treat today continue with her Mnire's medicine.  Okay to go on trip and see how she does   don't put Q-tips in ears. -Patient advised to return or notify health care team  if symptoms worsen or persist or new concerns  arise.  There are no Patient Instructions on file for this visit.   Neta Mends. Tiera Mensinger M.D.

## 2012-11-12 NOTE — Patient Instructions (Signed)
Basically normal exam today monitor her for alarm features and contact us should be fine to fly continue regular medicines.  Otalgia The most common reason for this in children is an infection of the middle ear. Pain from the middle ear is usually caused by a build-up of fluid and pressure behind the eardrum. Pain from an earache can be sharp, dull, or burning. The pain may be temporary or constant. The middle ear is connected to the nasal passages by a short narrow tube called the Eustachian tube. The Eustachian tube allows fluid to drain out of the middle ear, and helps keep the pressure in your ear equalized. CAUSES  A cold or allergy can block the Eustachian tube with inflammation and the build-up of secretions. This is especially likely in small children, because their Eustachian tube is shorter and more horizontal. When the Eustachian tube closes, the normal flow of fluid from the middle ear is stopped. Fluid can accumulate and cause stuffiness, pain, hearing loss, and an ear infection if germs start growing in this area. SYMPTOMS  The symptoms of an ear infection may include fever, ear pain, fussiness, increased crying, and irritability. Many children will have temporary and minor hearing loss during and right after an ear infection. Permanent hearing loss is rare, but the risk increases the more infections a child has. Other causes of ear pain include retained water in the outer ear canal from swimming and bathing. Ear pain in adults is less likely to be from an ear infection. Ear pain may be referred from other locations. Referred pain may be from the joint between your jaw and the skull. It may also come from a tooth problem or problems in the neck. Other causes of ear pain include:  A foreign body in the ear.  Outer ear infection.  Sinus infections.  Impacted ear wax.  Ear injury.  Arthritis of the jaw or TMJ problems.  Middle ear infection.  Tooth infections.  Sore throat with  pain to the ears. DIAGNOSIS  Your caregiver can usually make the diagnosis by examining you. Sometimes other special studies, including x-rays and lab work may be necessary. TREATMENT   If antibiotics were prescribed, use them as directed and finish them even if you or your child's symptoms seem to be improved.  Sometimes PE tubes are needed in children. These are little plastic tubes which are put into the eardrum during a simple surgical procedure. They allow fluid to drain easier and allow the pressure in the middle ear to equalize. This helps relieve the ear pain caused by pressure changes. HOME CARE INSTRUCTIONS   Only take over-the-counter or prescription medicines for pain, discomfort, or fever as directed by your caregiver. DO NOT GIVE CHILDREN ASPIRIN because of the association of Reye's Syndrome in children taking aspirin.  Use a cold pack applied to the outer ear for 15-20 minutes, 3-4 times per day or as needed may reduce pain. Do not apply ice directly to the skin. You may cause frost bite.  Over-the-counter ear drops used as directed may be effective. Your caregiver may sometimes prescribe ear drops.  Resting in an upright position may help reduce pressure in the middle ear and relieve pain.  Ear pain caused by rapidly descending from high altitudes can be relieved by swallowing or chewing gum. Allowing infants to suck on a bottle during airplane travel can help.  Do not smoke in the house or near children. If you are unable to quit smoking, smoke outside.  Control allergies. SEEK IMMEDIATE MEDICAL CARE IF:   You or your child are becoming sicker.  Pain or fever relief is not obtained with medicine.  You or your child's symptoms (pain, fever, or irritability) do not improve within 24 to 48 hours or as instructed.  Severe pain suddenly stops hurting. This may indicate a ruptured eardrum.  You or your children develop new problems such as severe headaches, stiff neck,  difficulty swallowing, or swelling of the face or around the ear. Document Released: 12/30/2003 Document Revised: 08/06/2011 Document Reviewed: 05/05/2008 [facility] Patient Information 2014 [place], [place].

## 2013-02-06 ENCOUNTER — Other Ambulatory Visit: Payer: Self-pay | Admitting: Family Medicine

## 2013-02-06 ENCOUNTER — Ambulatory Visit (INDEPENDENT_AMBULATORY_CARE_PROVIDER_SITE_OTHER)
Admission: RE | Admit: 2013-02-06 | Discharge: 2013-02-06 | Disposition: A | Discharge status: Home / Self Care | Payer: BC Managed Care – PPO | Source: Ambulatory Visit | Attending: Family Medicine | Admitting: Family Medicine

## 2013-02-06 DIAGNOSIS — S60221A Contusion of right hand, initial encounter: Secondary | ICD-10-CM

## 2013-02-06 DIAGNOSIS — S60229A Contusion of unspecified hand, initial encounter: Secondary | ICD-10-CM

## 2013-03-18 ENCOUNTER — Telehealth: Payer: Self-pay | Admitting: Internal Medicine

## 2013-03-18 NOTE — Telephone Encounter (Signed)
Probably not the cause but   Ok to check tsh free t4 and bmp  Dx weight  gain

## 2013-03-18 NOTE — Telephone Encounter (Signed)
Pt would like to get a thyroid panel done. Pt is in a training program, runs and walks daily, works w/ a Psychologist, educational, watches what she eats,  and has gained weight.  Pt is concerned and frustrated and would like to have this checked. pls advise.

## 2013-03-19 ENCOUNTER — Other Ambulatory Visit: Payer: Self-pay | Admitting: Family Medicine

## 2013-03-19 DIAGNOSIS — R635 Abnormal weight gain: Secondary | ICD-10-CM

## 2013-03-19 NOTE — Telephone Encounter (Signed)
Please schedule the pt a lab appt.  I have placed the orders in the system.  Thanks!

## 2013-03-19 NOTE — Telephone Encounter (Signed)
Pt aware/ appt set/kh 

## 2013-03-19 NOTE — Telephone Encounter (Signed)
Unable to leave a message mail box full

## 2013-03-20 ENCOUNTER — Other Ambulatory Visit (INDEPENDENT_AMBULATORY_CARE_PROVIDER_SITE_OTHER): Payer: BC Managed Care – PPO

## 2013-03-20 DIAGNOSIS — R635 Abnormal weight gain: Secondary | ICD-10-CM

## 2013-03-20 LAB — BASIC METABOLIC PANEL
CO2: 30 mEq/L (ref 19–32)
Calcium: 9.9 mg/dL (ref 8.4–10.5)
Creatinine, Ser: 0.7 mg/dL (ref 0.4–1.2)
Glucose, Bld: 75 mg/dL (ref 70–99)

## 2013-03-20 LAB — T4, FREE: Free T4: 0.9 ng/dL (ref 0.60–1.60)

## 2013-03-24 ENCOUNTER — Encounter: Payer: Self-pay | Admitting: Family Medicine

## 2013-04-15 ENCOUNTER — Telehealth: Payer: Self-pay | Admitting: Internal Medicine

## 2013-04-15 ENCOUNTER — Encounter: Payer: Self-pay | Admitting: Family Medicine

## 2013-04-15 ENCOUNTER — Ambulatory Visit (INDEPENDENT_AMBULATORY_CARE_PROVIDER_SITE_OTHER): Payer: BC Managed Care – PPO | Admitting: Family Medicine

## 2013-04-15 VITALS — BP 112/76 | HR 84 | Temp 98.3°F | Wt 158.0 lb

## 2013-04-15 DIAGNOSIS — J329 Chronic sinusitis, unspecified: Secondary | ICD-10-CM

## 2013-04-15 MED ORDER — AMOXICILLIN-POT CLAVULANATE 875-125 MG PO TABS: 1.0000 | ORAL_TABLET | Freq: Two times a day (BID) | ORAL | Status: DC

## 2013-04-15 NOTE — Progress Notes (Signed)
Pre visit review using our clinic review tool, if applicable. No additional management support is needed unless otherwise documented below in the visit note. 

## 2013-04-15 NOTE — Telephone Encounter (Signed)
Patient Information:  Caller Name: [place]  Phone: [phone]  Patient: Sierra [name]  Gender: Female  DOB: Jun 21, 1955  Age: 57 Years  PCP: Berniece [facility])  Office Follow Up:  Does the office need to follow up with this patient?: No  Instructions For The Office: N/A  RN Note:  Mild short of breath with exertion. Hydrate and humidify to loosen mucus. May try Guaifenesin.  Requested late PM appointment.  Dr Fabian Sharp has no remaining appointments.  Symptoms  Reason For Call & Symptoms: Worsening cold symptoms for nearly 2 weeks with mild hoarseness, fatigue, aching and intermittent productive cough.  Reviewed Health History In EMR: Yes  Reviewed Medications In EMR: Yes  Reviewed Allergies In EMR: Yes  Reviewed Surgeries / Procedures: Yes  Date of Onset of Symptoms: 03/24/2013  Treatments Tried: Dayquil  Treatments Tried Worked: Yes  Guideline(s) Used:  Colds  Disposition Per Guideline:   See Today or Tomorrow in Office  Reason For Disposition Reached:   Nasal discharge present > 10 days  Advice Given:  Reassurance  Colds are very common and may make you feel uncomfortable.  Colds are caused by viruses, and no medicine or "shot" will cure an uncomplicated cold.  Colds are usually not serious.  For a Runny Nose With Profuse Discharge:   Nasal mucus and discharge helps to wash viruses and bacteria out of the nose and sinuses.  Blowing the nose is all that is needed.  For a Stuffy Nose - Use Nasal Washes:  Introduction: Saline (salt water) nasal irrigation (nasal wash) is an effective and simple home remedy for treating stuffy nose and sinus congestion. The nose can be irrigated by pouring, spraying, or squirting salt water into the nose and then letting it run back out.  Humidifier:  If the air in your home is dry, use a cool-mist humidifier  Contagiousness:  The cold virus is present in your nasal secretions.  Cover your nose and mouth with a tissue when  you sneeze or cough.  Wash your hands frequently with soap and water.  You can return to work or school after the fever is gone and you feel well enough to participate in normal activities.  Expected Course:   Fever may last 2-3 days  Nasal discharge 7-14 days  Cough up to 2-3 weeks.  Call Back If:  Difficulty breathing occurs  Cough lasts more than 3 weeks  You become worse  Patient Will Follow Care Advice:  YES  Appointment Scheduled:  04/15/2013 15:30:00 Appointment Scheduled Provider:  Evelena Peat (Family Practice)

## 2013-04-15 NOTE — Patient Instructions (Signed)

## 2013-04-15 NOTE — Progress Notes (Signed)
  Subjective:    Patient ID: Sierra [name], female    DOB: 1955-10-12, 57 y.o.   MRN: [id number]  HPI Patient seen with approximately 12 day history of upper respiratory illness Started with nasal congestion and subsequent cough. Her cough has persisted and is now productive. She also is developing some dark yellow nasal discharge. Denies sore throat. Increased hoarseness past few days. Denies any fever. Increased malaise. No significant headaches.  Generally very healthy. She does not have any antibiotic allergies. She's tried various over-the-counter medications with minimal relief  Past Medical History  Diagnosis Date  . Meniere's disease     on diuretic therapy  . Tracheobronchopathia-osteochondroplastica 02/2010    involvement of trachea per FOB  . Normal nuclear stress test     Myoview 2006   . History of Doppler ultrasound     leg negative  2010 right  . History of Doppler ultrasound     right leg 2010 neg  . Shortness of breath     negative echo and stress echo 11/11 except for grade 2 diastolic dysfunction. EF is 60%. Normal CPX 11/11   Past Surgical History  Procedure Laterality Date  . Myoview  2006    stress- neg  . Doppler echocardiography  2010    rt leg neg  . Skin graft  1963  . Knee arthroscopy  2003    right  . Bronchoscopy  10/11  . Knee arthroscopy  05/11/2011    Procedure: ARTHROSCOPY KNEE;  Surgeon: Nilda Simmer, MD;  Location: [facility];  Service: Orthopedics;  Laterality: Right;  with lateral meniscectomy    reports that she has never smoked. She does not have any smokeless tobacco history on file. She reports that she drinks alcohol. She reports that she does not use illicit drugs. family history includes Allergies in her father; Asthma in her father; Hyperlipidemia in her father; Macular degeneration in her father; Multiple sclerosis in her sister; Prostate cancer in her father; Uterine cancer in her mother and  sister. Allergies  Allergen Reactions  . Morphine   . Tributyl Phosphate Hives      Review of Systems As per history of present illness    Objective:   Physical Exam  Constitutional: She appears well-developed and well-nourished. No distress.  HENT:  Right Ear: External ear normal.  Left Ear: External ear normal.  Mouth/Throat: Oropharynx is clear and moist.  Neck: Neck supple.  Cardiovascular: Normal rate and regular rhythm.   Pulmonary/Chest: Effort normal and breath sounds normal. No respiratory distress. She has no wheezes. She has no rales.  Lymphadenopathy:    She has no cervical adenopathy.          Assessment & Plan:  URI with probable acute sinusitis. She has developed hoarseness, dark colored nasal discharge and malaise approximately 12 days into illness. For this reason, start Augmentin 875 mg twice daily for 10 days. Followup as needed if symptoms worsen or persist

## 2013-04-15 NOTE — Telephone Encounter (Signed)
FYI

## 2013-09-05 ENCOUNTER — Ambulatory Visit: Payer: BC Managed Care – PPO | Admitting: Family Medicine

## 2013-09-05 VITALS — BP 120/80 | HR 54 | Temp 97.9°F | Resp 14 | Ht 64.0 in | Wt 155.0 lb

## 2013-09-05 DIAGNOSIS — H43391 Other vitreous opacities, right eye: Secondary | ICD-10-CM

## 2013-09-05 DIAGNOSIS — H43399 Other vitreous opacities, unspecified eye: Secondary | ICD-10-CM

## 2013-09-05 NOTE — Progress Notes (Signed)
[facility] [address], [place] [address] [phone]  Date:  09/05/2013   Name:  Sierra [name]   DOB:  02-Nov-1955   MRN:  [id number]  PCP:  Lottie Dawson, MD    Chief Complaint: Eye Problem   History of Present Illness:  Sierra [name] is a 58 y.o. very pleasant female patient who presents with the following:  Today is Saturday. She is here today with a problem with her right eye- she noted some floaters in her right eye starting on Thursday.  She will occasionally have floaters, but not usually.  Then yesterday she noted some "flashes of light" in her right eye. She continues to have floaters and flashers ONLY right eye, no pain.   Her vision seems to be ok She wears glasses- no contacts.  No history of head or eye trauma.  She has never had a stroke.  Her optho is Dr. Bethanne Ginger, but she may want to change this.  She has never had any problems with her eyes except for need for corrective lenses.   No numbness, weakness, slurred speech or dizziness.   She did get migraine with aura in the past- however aura would be in both eyes.   She does not have a HA.   Her husband is Dr. Cheryln Manly, clinical psychologist for [place].  One of the other [place] had seen Dr. Zadie Rhine recently for an eye problem and highly recommended him; Sierra [name] would like me to call him if possible   Patient Active Problem List   Diagnosis Date Noted  . Dizzy 08/22/2012  . Chest pain 08/22/2012  . Acute lateral meniscus tear of right knee 05/03/2011  . Precordial pain 12/28/2010  . Easy bruisability 09/18/2010  . OTHER DISEASES OF TRACHEA AND BRONCHUS 03/12/2010  . PULMONARY NODULE, RIGHT UPPER LOBE 02/22/2010  . SHORTNESS OF BREATH 02/17/2010  . HIP PAIN, LEFT 02/22/2009  . ANSERINE BURSITIS, LEFT 02/22/2009  . METATARSALGIA 02/22/2009  . UNSPECIFIED MENIERES DISEASE 02/18/2009  . KNEE PAIN, LEFT 02/18/2009  . PHLEBITIS, LOWER EXTREMITY 09/17/2008  . UNSPECIFIED  HEARING LOSS 05/26/2008  . BACK PAIN, THORACIC REGION 10/13/2007  . DIZZINESS 10/13/2007  . ALLERGIC URTICARIA 08/06/2007    Past Medical History  Diagnosis Date  . Meniere's disease     on diuretic therapy  . Tracheobronchopathia-osteochondroplastica 02/2010    involvement of trachea per FOB  . Normal nuclear stress test     Myoview 2006   . History of Doppler ultrasound     leg negative  2010 right  . History of Doppler ultrasound     right leg 2010 neg  . Shortness of breath     negative echo and stress echo 11/11 except for grade 2 diastolic dysfunction. EF is 60%. Normal CPX 11/11    Past Surgical History  Procedure Laterality Date  . Myoview  2006    stress- neg  . Doppler echocardiography  2010    rt leg neg  . Skin graft  1963  . Knee arthroscopy  2003    right  . Bronchoscopy  10/11  . Knee arthroscopy  05/11/2011    Procedure: ARTHROSCOPY KNEE;  Surgeon: Lorn Junes, MD;  Location: [facility];  Service: Orthopedics;  Laterality: Right;  with lateral meniscectomy    History  Substance Use Topics  . Smoking status: Never Smoker   . Smokeless tobacco: Not on file  . Alcohol Use: Yes  Comment: socially 1-2 a month    Family History  Problem Relation Age of Onset  . Uterine cancer Mother     sister  . Asthma Father   . Hyperlipidemia Father   . Macular degeneration Father   . Allergies Father   . Prostate cancer Father   . Multiple sclerosis Sister   . Uterine cancer Sister     Allergies  Allergen Reactions  . Morphine   . Tributyl Phosphate Hives  . Latex Rash    Medication list has been reviewed and updated.  Current Outpatient Prescriptions on File Prior to Visit  Medication Sig Dispense Refill  . triamterene-hydrochlorothiazide (MAXZIDE-25) 37.5-25 MG per tablet Take 1 tablet by mouth daily.       Marland Kitchen estradiol (ESTRACE) 0.1 MG/GM vaginal cream Place 2 g vaginally daily. Using 2-3 times weekly       No current  facility-administered medications on file prior to visit.    Review of Systems:  As per HPI- otherwise negative.   Physical Examination: Filed Vitals:   09/05/13 1012  BP: 120/80  Pulse: 54  Temp: 97.9 F (36.6 C)  Resp: 14   Filed Vitals:   09/05/13 1012  Height: 5\' 4"  (1.626 m)  Weight: 155 lb (70.308 kg)   Body mass index is 26.59 kg/(m^2). Ideal Body Weight: Weight in (lb) to have BMI = 25: 145.3  GEN: WDWN, NAD, Non-toxic, A & O x 3, looks well HEENT: Atraumatic, Normocephalic. Neck supple. No masses, No LAD.  Bilateral TM wnl, oropharynx normal.  PEERL,EOMI.   Normal fundoscopic exam bilaterally.  Eyes appear normal, no injection or discharge Ears and Nose: No external deformity. CV: RRR, No M/G/R. No JVD. No thrill. No extra heart sounds. PULM: CTA B, no wheezes, crackles, rhonchi. No retractions. No resp. distress. No accessory muscle use. ABD: S, NT, ND, +BS. No rebound. No HSM. EXTR: No c/c/e NEURO Normal gait. Normal strength, sensation and DTR all extremities.  Normal romberg, normal facial movement and strength.   PSYCH: Normally interactive. Conversant. Not depressed or anxious appearing.  Calm demeanor.   Assessment and Plan: Vitreous floaters of right eye  Discussed with Dr. Zadie Rhine; appreciate his kind and helpful advice on the phone.  [name] does need to be seen soon but not immeiately.  She will see him on Monday- he kindly gave his cell number for her to call if any problems in the meantime.   Signed Lamar Blinks, MD

## 2013-09-05 NOTE — Patient Instructions (Signed)
Relax for the rest of the weekend- no jogging.  On Monday morning please go to Dr.  Dahlia Bailiff office [facility] [place], [place] [address]  Tel: [phone]  If you are getting worse or have any "dark shadows" please call him at [phone]

## 2013-11-25 ENCOUNTER — Other Ambulatory Visit: Payer: Self-pay | Admitting: Dermatology

## 2013-12-28 ENCOUNTER — Other Ambulatory Visit: Payer: Self-pay

## 2013-12-28 DIAGNOSIS — Z1231 Encounter for screening mammogram for malignant neoplasm of breast: Secondary | ICD-10-CM

## 2014-01-07 ENCOUNTER — Ambulatory Visit: Payer: BC Managed Care – PPO

## 2014-01-07 ENCOUNTER — Ambulatory Visit
Admission: RE | Admit: 2014-01-07 | Discharge: 2014-01-07 | Disposition: A | Discharge status: Home / Self Care | Payer: 59 | Source: Ambulatory Visit

## 2014-01-07 ENCOUNTER — Encounter (INDEPENDENT_AMBULATORY_CARE_PROVIDER_SITE_OTHER): Payer: Self-pay

## 2014-01-07 DIAGNOSIS — Z1231 Encounter for screening mammogram for malignant neoplasm of breast: Secondary | ICD-10-CM

## 2014-07-08 ENCOUNTER — Ambulatory Visit (INDEPENDENT_AMBULATORY_CARE_PROVIDER_SITE_OTHER): Payer: 59 | Admitting: Family Medicine

## 2014-07-08 ENCOUNTER — Telehealth: Payer: Self-pay | Admitting: Internal Medicine

## 2014-07-08 ENCOUNTER — Encounter: Payer: Self-pay | Admitting: Family Medicine

## 2014-07-08 VITALS — BP 120/82 | HR 97 | Temp 97.9°F | Wt 151.7 lb

## 2014-07-08 DIAGNOSIS — R1013 Epigastric pain: Secondary | ICD-10-CM

## 2014-07-08 DIAGNOSIS — K30 Functional dyspepsia: Secondary | ICD-10-CM

## 2014-07-08 DIAGNOSIS — J069 Acute upper respiratory infection, unspecified: Secondary | ICD-10-CM

## 2014-07-08 MED ORDER — BENZONATATE 100 MG PO CAPS: 100.0000 mg | ORAL_CAPSULE | Freq: Two times a day (BID) | ORAL | Status: DC | PRN

## 2014-07-08 NOTE — Telephone Encounter (Signed)
[facility] - Client [facility] Patient Name: Sierra [name] DOB: 10-14-1955 Initial Comment Caller states she has had a cough for a couple weeks. Nurse Assessment Nurse: Markus Daft, RN, Sherre Poot Date/Time (Eastern Time): 07/08/2014 1:38:11 PM Confirm and document reason for call. If symptomatic, describe symptoms. ---Caller states she has had a productive cough for a couple weeks. It was worse last week, but still there. A little stuffy nose remaining. No fever. - Her father is in rehab and has a trach and feeding tube and they don't want her to be near him if she is sick or contagious. Has the patient traveled out of the country within the last 30 days? ---Not Applicable Does the patient require triage? ---Yes Related visit to physician within the last 2 weeks? ---No Does the PT have any chronic conditions? (i.e. diabetes, asthma, etc.) ---No Guidelines Guideline Title Affirmed Question Affirmed Notes Cough - Acute Productive Cough present > 10 days Final Disposition User See PCP When Office is Open (within 3 days) Markus Daft, Therapist, sports, Sherre Poot Comments She has to fly to [place] to visit her father in his rehab facility. She leaves on Wednesday, 07/14/14. Appt made for this afternoon with Dr. Colin Benton.

## 2014-07-08 NOTE — Progress Notes (Signed)
HPI:  URI: -started about 2 weeks ago -symptoms:PND, congestion, cough -no SOB, wheezing, fevers -husband had the same  -denies: flu exposure, travel outside the country -no medications -no hx asthma, copd or lung problems  Mild digestive issues: -had "food poisoning" a few weeks ago -she, husband and several other acquaintances had nvd - which has resolved -she has had occ bloating, gas since -denies: persistent diarrhea, fevers, blood in stools, weight loss, abd pain  ROS: See pertinent positives and negatives per HPI.  Past Medical History  Diagnosis Date  . Meniere's disease     on diuretic therapy  . Tracheobronchopathia-osteochondroplastica 02/2010    involvement of trachea per FOB  . Normal nuclear stress test     Myoview 2006   . History of Doppler ultrasound     leg negative  2010 right  . History of Doppler ultrasound     right leg 2010 neg  . Shortness of breath     negative echo and stress echo 11/11 except for grade 2 diastolic dysfunction. EF is 60%. Normal CPX 11/11    Past Surgical History  Procedure Laterality Date  . Myoview  2006    stress- neg  . Doppler echocardiography  2010    rt leg neg  . Skin graft  1963  . Knee arthroscopy  2003    right  . Bronchoscopy  10/11  . Knee arthroscopy  05/11/2011    Procedure: ARTHROSCOPY KNEE;  Surgeon: Lorn Junes, MD;  Location: [facility];  Service: Orthopedics;  Laterality: Right;  with lateral meniscectomy    Family History  Problem Relation Age of Onset  . Uterine cancer Mother     sister  . Asthma Father   . Hyperlipidemia Father   . Macular degeneration Father   . Allergies Father   . Prostate cancer Father   . Multiple sclerosis Sister   . Uterine cancer Sister     History   Social History  . Marital Status: Married    Spouse Name: N/A  . Number of Children: N/A  . Years of Education: N/A   Social History Main Topics  . Smoking status: Never Smoker   .  Smokeless tobacco: Not on file  . Alcohol Use: Yes     Comment: socially 1-2 a month  . Drug Use: No  . Sexual Activity: Yes   Other Topics Concern  . 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  benzonatate (TESSALON) 100 MG capsule, Take 1 capsule (100 mg total) by mouth 2 (two) times daily as needed for cough., Disp: 20 capsule, Rfl: 0 .  estradiol (ESTRACE) 0.1 MG/GM vaginal cream, Place 2 g vaginally daily. Using 2-3 times weekly, Disp: , Rfl:  .  triamterene-hydrochlorothiazide (MAXZIDE-25) 37.5-25 MG per tablet, Take 1 tablet by mouth daily. , Disp: , Rfl:   EXAM:  Filed Vitals:   07/08/14 1618  BP: 120/82  Pulse: 97  Temp: 97.9 F (36.6 C)    Body mass index is 26.03 kg/(m^2).  GENERAL: vitals reviewed and listed above, alert, oriented, appears well hydrated and in no acute distress  HEENT: atraumatic, conjunttiva clear, no obvious abnormalities on inspection of external nose and ears, normal appearance of ear canals and TMs, clear nasal congestion, mild post oropharyngeal erythema with PND, no tonsillar edema or exudate, no  sinus TTP  NECK: no obvious masses on inspection  LUNGS: clear to auscultation bilaterally, no wheezes, rales or rhonchi, good air movement  CV: HRRR, no peripheral edema  ABD: BS+, soft, NTTP  MS: moves all extremities without noticeable abnormality  PSYCH: pleasant and cooperative, no obvious depression or anxiety  ASSESSMENT AND PLAN:  Discussed the following assessment and plan:  Acute upper respiratory infection - Plan: benzonatate (TESSALON) 100 MG capsule -given HPI and exam findings today, a serious infection or illness is unlikely. We discussed potential etiologies, with VURI being most likely, and advised supportive care and monitoring. We discussed treatment side effects, likely course, antibiotic misuse, transmission, and  signs of developing a serious illness. -of course, we advised to return or notify a doctor immediately if symptoms worsen or persist or new concerns arise.   Indigestion -align for 2 weeks, if persists re-eval with PCP    Patient Instructions  INSTRUCTIONS FOR UPPER RESPIRATORY INFECTION:  -plenty of rest and fluids  -nasal saline wash 2-3 times daily (use prepackaged nasal saline or bottled/distilled water if making your own)   -clean nose with nasal saline before using the nasal steroid or sinex  -can use AFRIN nasal spray for drainage and nasal congestion - but do NOT use longer then 3-4 days  -can use tylenol or ibuprofen as directed for aches and sorethroat  -in the winter time, using a humidifier at night is helpful (please follow cleaning instructions)  -if you are taking a cough medication - use only as directed, may also try a teaspoon of honey to coat the throat and throat lozenges  -for sore throat, salt water gargles can help  -follow up if you have fevers, facial pain, tooth pain, difficulty breathing or are worsening or not getting better in 5-7 days  -Align and follow up with Dr. Regis Bill if persists in 2 weeks is stomach issues persist      KIM, HANNAH R.

## 2014-07-08 NOTE — Patient Instructions (Signed)
INSTRUCTIONS FOR UPPER RESPIRATORY INFECTION:  -plenty of rest and fluids  -nasal saline wash 2-3 times daily (use prepackaged nasal saline or bottled/distilled water if making your own)   -clean nose with nasal saline before using the nasal steroid or sinex  -can use AFRIN nasal spray for drainage and nasal congestion - but do NOT use longer then 3-4 days  -can use tylenol or ibuprofen as directed for aches and sorethroat  -in the winter time, using a humidifier at night is helpful (please follow cleaning instructions)  -if you are taking a cough medication - use only as directed, may also try a teaspoon of honey to coat the throat and throat lozenges  -for sore throat, salt water gargles can help  -follow up if you have fevers, facial pain, tooth pain, difficulty breathing or are worsening or not getting better in 5-7 days  -Align and follow up with Dr. Regis Bill if persists in 2 weeks is stomach issues persist

## 2014-07-08 NOTE — Telephone Encounter (Signed)
Noted  

## 2014-08-19 ENCOUNTER — Other Ambulatory Visit: Payer: Self-pay | Admitting: Dermatology

## 2014-12-10 ENCOUNTER — Telehealth: Payer: Self-pay | Admitting: Internal Medicine

## 2014-12-10 ENCOUNTER — Ambulatory Visit (INDEPENDENT_AMBULATORY_CARE_PROVIDER_SITE_OTHER): Payer: 59 | Admitting: Family Medicine

## 2014-12-10 ENCOUNTER — Encounter: Payer: Self-pay | Admitting: Family Medicine

## 2014-12-10 VITALS — BP 109/70 | HR 70 | Temp 98.2°F | Ht 64.0 in | Wt 157.0 lb

## 2014-12-10 DIAGNOSIS — H00013 Hordeolum externum right eye, unspecified eyelid: Secondary | ICD-10-CM

## 2014-12-10 MED ORDER — TOBRAMYCIN-DEXAMETHASONE 0.3-0.1 % OP SUSP: 2.0000 [drp] | OPHTHALMIC | Status: DC

## 2014-12-10 NOTE — Progress Notes (Signed)
Pre visit review using our clinic review tool, if applicable. No additional management support is needed unless otherwise documented below in the visit note. 

## 2014-12-10 NOTE — Telephone Encounter (Signed)
Appointment to see MD Sarajane Jews today

## 2014-12-10 NOTE — Progress Notes (Signed)
   Subjective:    Patient ID: Sierra [name], female    DOB: 1955/12/17, 59 y.o.   MRN: [id number]  HPI Here for 2 days of mild swelling and discomfort in the right upper eyelid. No vision problems. She does not wear contacts.    Review of Systems  Constitutional: Negative.   HENT: Negative.   Eyes: Positive for pain. Negative for discharge, redness, itching and visual disturbance.  Respiratory: Negative.        Objective:   Physical Exam  Constitutional: She appears well-developed and well-nourished.  Eyes: Conjunctivae are normal. Pupils are equal, round, and reactive to light.  Left upper eyelid has a tiny stye on the nasal edge   Neck: No thyromegaly present.  Lymphadenopathy:    She has no cervical adenopathy.          Assessment & Plan:  Stye, treat with hot compresses and Tobradex drops

## 2014-12-10 NOTE — Telephone Encounter (Signed)
[facility]    --------------------------------------------------------------------------------   Patient Name: Sierra [name]  Gender: Female  DOB: 1955-12-09   Age: 59 Y 74 M 12 D  Return Phone Number: [phone] (Primary)  Address: [address]    City/State/Zip: [place]  [address]   Client [facility] - Client  Client Site [facility] - Day  Physician Shanon Ace   Contact Type Call  Call Type Triage / Clinical  Caller Name Arbor Leer  Relationship To Patient Self  Return Phone Number [phone] (Primary)  Chief Complaint Eye Pain  Initial Comment Caller states she woke up this morning, with her eye lid puffy and red. Eye pain.  PreDisposition Call Doctor       Nurse Assessment  Nurse: Buck Mam, RN, Trish Date/Time Eilene Ghazi Time): 12/10/2014 12:01:45 PM  Confirm and document reason for call. If symptomatic, describe symptoms. ---Patient is calling for self and caller states she woke up this morning, with her eye lid puffy and red. Right Eye pain. No temperature no injury. Lid is red and more swollen warm to touch. Vision is not effected.    Has the patient traveled out of the country within the last 30 days? ---No    Does the patient require triage? ---Yes    Related visit to physician within the last 2 weeks? ---No    Does the PT have any chronic conditions? (i.e. diabetes, asthma, etc.) ---Yes    List chronic conditions. ---Leniers Disease           Guidelines          Guideline Title Affirmed Question Affirmed Notes Nurse Date/Time (Eastern Time)  Sty [1] Eyelid is very swollen AND [2] no fever    Buck Mam, RN, Trish 12/10/2014 12:07:54 PM    Disp. Time Eilene Ghazi Time) Disposition Final User    12/10/2014 11:42:18 AM Attempt made - message left   Buck Mam, RN, Trish      12/10/2014 12:11:09 PM See Physician within 24  Hours Yes Buck Mam, RN, Pensions consultant Understands: Yes  Disagree/Comply: Comply       Care Advice Given Per Guideline        SEE PHYSICIAN WITHIN 24 HOURS: LOCAL HEAT: * Apply a warm, wet washcloth to the eye for 10 minutes 4 times a day to help the sty come to a head. * Continue to cleanse the eye with warm water several times a day even after the sty begins to drain. * Do not rub the eye (Reason: can spread the infection). CALL BACK IF: * You become worse. CARE ADVICE given per Sty (Adult) guideline.    After Care Instructions Given        Call Event Type User Date / Time Description        --------------------------------------------------------------------------------            Referrals  REFERRED TO PCP OFFICE

## 2014-12-13 ENCOUNTER — Ambulatory Visit (INDEPENDENT_AMBULATORY_CARE_PROVIDER_SITE_OTHER): Payer: 59 | Admitting: Internal Medicine

## 2014-12-13 ENCOUNTER — Encounter: Payer: Self-pay | Admitting: Internal Medicine

## 2014-12-13 VITALS — BP 122/70 | Temp 97.5°F | Wt 156.6 lb

## 2014-12-13 DIAGNOSIS — H9201 Otalgia, right ear: Secondary | ICD-10-CM | POA: Diagnosis not present

## 2014-12-13 NOTE — Patient Instructions (Addendum)
Your right ear pain may be from eustachian tube dysfunction Use intranasal saline as directed.  Use over the counter Flonase or Nasonex if your ear symptoms do not improve Other possible causes - TMJ sydrome

## 2014-12-13 NOTE — Progress Notes (Signed)
Pre visit review using our clinic review tool, if applicable. No additional management support is needed unless otherwise documented below in the visit note. 

## 2014-12-13 NOTE — Progress Notes (Signed)
Subjective:    Patient ID: Sierra [name], female    DOB: 1955/06/23, 59 y.o.   MRN: [id number]  HPI  59 year old white female with history of Mnire's disease complains of intermittent right ear pain. Patient seen last week by Dr. Sarajane Jews for stye over right eye. Patient concerned that right ear pain may be related to eye symptoms.  Stye over right eye improving with warm compress.  She took Aleve yesterday.  It alleviated right ear pain.    Review of Systems Negative for hearing changes.  No dizziness    Past Medical History  Diagnosis Date  . Meniere's disease     on diuretic therapy  . Tracheobronchopathia-osteochondroplastica 02/2010    involvement of trachea per FOB  . Normal nuclear stress test     Myoview 2006   . History of Doppler ultrasound     leg negative  2010 right  . History of Doppler ultrasound     right leg 2010 neg  . Shortness of breath     negative echo and stress echo 11/11 except for grade 2 diastolic dysfunction. EF is 60%. Normal CPX 11/11    History   Social History  . Marital Status: Married    Spouse Name: N/A  . Number of Children: N/A  . Years of Education: N/A   Occupational History  . Not on file.   Social History Main Topics  . Smoking status: Never Smoker   . Smokeless tobacco: Never Used  . Alcohol Use: 0.0 oz/week    0 Standard drinks or equivalent per week     Comment: socially 1-2 a month  . Drug Use: No  . Sexual Activity: Yes   Other Topics Concern  . 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 Myoview  2006    stress- neg  . Doppler echocardiography  2010    rt leg neg  . Skin graft  1963  . Knee arthroscopy  2003    right  . Bronchoscopy  10/11  . Knee arthroscopy  05/11/2011    Procedure: ARTHROSCOPY KNEE;  Surgeon: Lorn Junes, MD;  Location: [facility];  Service:  Orthopedics;  Laterality: Right;  with lateral meniscectomy    Family History  Problem Relation Age of Onset  . Uterine cancer Mother     sister  . Asthma Father   . Hyperlipidemia Father   . Macular degeneration Father   . Allergies Father   . Prostate cancer Father   . Multiple sclerosis Sister   . Uterine cancer Sister     Allergies  Allergen Reactions  . Morphine   . Tributyl Phosphate Hives  . Latex Rash    Current Outpatient Prescriptions on File Prior to Visit  Medication Sig Dispense Refill  . estradiol (ESTRACE) 0.1 MG/GM vaginal cream Place 2 g vaginally daily. Using 2-3 times weekly    . tobramycin-dexamethasone (TOBRADEX) ophthalmic solution Place 2 drops into the right eye every 4 (four) hours while awake. 10 mL 0  . triamterene-hydrochlorothiazide (MAXZIDE-25) 37.5-25 MG per tablet Take 1 tablet by mouth. Every other day     No current facility-administered medications on file prior to visit.    BP 122/70 mmHg  Temp(Src) 97.5 F (36.4 C) (Oral)  Wt 156 lb 9.6 oz (71.033 kg)  Objective:   Physical Exam  Constitutional: She appears well-developed and well-nourished. No distress.  HENT:  Head: Normocephalic and atraumatic.  Right Ear: External ear normal.  Left Ear: External ear normal.  No TMJ tenderness right side  Eyes: EOM are normal. Pupils are equal, round, and reactive to light.  Neck: Neck supple.  Cardiovascular: Normal rate, regular rhythm and normal heart sounds.   Pulmonary/Chest: Effort normal and breath sounds normal.  Lymphadenopathy:    She has no cervical adenopathy.          Assessment & Plan:

## 2014-12-13 NOTE — Assessment & Plan Note (Signed)
Her symptoms likely secondary to eustachian tube dysfunction.  Trial of nasal saline and OTC intranasal steroids.  Patient advised to call office if symptoms persist or worsen.

## 2015-05-13 ENCOUNTER — Telehealth: Payer: Self-pay | Admitting: Family Medicine

## 2015-05-13 ENCOUNTER — Ambulatory Visit: Payer: 59 | Admitting: Family Medicine

## 2015-05-13 ENCOUNTER — Encounter: Payer: Self-pay | Admitting: Family Medicine

## 2015-05-13 ENCOUNTER — Ambulatory Visit (INDEPENDENT_AMBULATORY_CARE_PROVIDER_SITE_OTHER): Payer: 59 | Admitting: Family Medicine

## 2015-05-13 VITALS — BP 102/72 | HR 71 | Temp 98.1°F | Ht 64.0 in

## 2015-05-13 DIAGNOSIS — R0789 Other chest pain: Secondary | ICD-10-CM | POA: Diagnosis not present

## 2015-05-13 NOTE — Patient Instructions (Addendum)
BEFORE YOU LEAVE: -schedule follow up with Dr. Regis Bill in 1 month  Prilosec 20mg  (available over the counter) daily for 2 weeks  Please consider some regular counseling to help with stress  Seek care immediately if worsening or other concerns

## 2015-05-13 NOTE — Progress Notes (Signed)
Patient declines weight measurement today. 

## 2015-05-13 NOTE — Progress Notes (Signed)
Pre visit review using our clinic review tool, if applicable. No additional management support is needed unless otherwise documented below in the visit note. 

## 2015-05-13 NOTE — Telephone Encounter (Signed)
Pt walked into the office today.  Complaining of fatigue, dizziness and chest discomfort.  Was told by her dentist that her bp was low (90s/50s).  Sx started on Tuesday.  Tuesday night she was cooking and broke out in a sweat.  She had to leave work on [employer] and Wed due to fatigue.  Went home to nap.  Informed me that her daughter is getting married and has some health concerns.  Looking at the wedding venue today.  Feeling anxious about her daughter's health.  BP: 122/76 HR: 66 O2: 98%  Pt scheduled to see Dr. Maudie Mercury today at 2.  Asked to arrive early for her appointment. Dr. Regis Bill sent me a test message informing me that the pt was going to come this morning.

## 2015-05-13 NOTE — Progress Notes (Signed)
HPI:  JAYLN LIV is a 59 yo patient of Dr. Regis Bill with a PMH meniere's here for an acute visit for:  CP/Fatigue/Stress: -several episodes of mild upper substernal CP at rest that resolved spontaneously after several hours -BP was several points lower then usual at dentist on wrist BP check -stressed about daughter's wedding and daughter's health (dx with thyroid ca), felt really tired a few times this week and when was up cooking in the kitchen got sweaty once -really bad acid reflux 2 weeks ago, clearing of throat from time to time -denies: persistent CP, CP today, CP with activity (exercises frequently and has not had any symptoms with activity), fevers, SOB, DOE, cough, depression -she is worried about a heart issue -on ROC hx dizziness, atypyical CP, dyspnea - with meniere's and has had several cardiac evals in the past - echo and stress testing in 2014    ROS: See pertinent positives and negatives per HPI.  Past Medical History  Diagnosis Date  . Meniere's disease     on diuretic therapy  . Tracheobronchopathia-osteochondroplastica 02/2010    involvement of trachea per FOB  . Normal nuclear stress test     Myoview 2006   . History of Doppler ultrasound     leg negative  2010 right  . History of Doppler ultrasound     right leg 2010 neg  . Shortness of breath     negative echo and stress echo 11/11 except for grade 2 diastolic dysfunction. EF is 60%. Normal CPX 11/11    Past Surgical History  Procedure Laterality Date  . Myoview  2006    stress- neg  . Doppler echocardiography  2010    rt leg neg  . Skin graft  1963  . Knee arthroscopy  2003    right  . Bronchoscopy  10/11  . Knee arthroscopy  05/11/2011    Procedure: ARTHROSCOPY KNEE;  Surgeon: Lorn Junes, MD;  Location: [facility];  Service: Orthopedics;  Laterality: Right;  with lateral meniscectomy    Family History  Problem Relation Age of Onset  . Uterine cancer Mother    sister  . Asthma Father   . Hyperlipidemia Father   . Macular degeneration Father   . Allergies Father   . Prostate cancer Father   . Multiple sclerosis Sister   . Uterine cancer Sister     Social History   Social History  . Marital Status: Married    Spouse Name: N/A  . Number of Children: N/A  . Years of Education: N/A   Social History Main Topics  . Smoking status: Never Smoker   . Smokeless tobacco: Never Used  . Alcohol Use: 0.0 oz/week    0 Standard drinks or equivalent per week     Comment: socially 1-2 a month  . Drug Use: No  . Sexual Activity: Yes   Other Topics Concern  . 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  estradiol (ESTRACE) 0.1 MG/GM vaginal cream, Place 2 g vaginally daily. Using 2-3 times weekly, Disp: , Rfl:  .  triamterene-hydrochlorothiazide (MAXZIDE-25) 37.5-25 MG per tablet, Take 1 tablet by mouth. Every other day, Disp: , Rfl:   EXAM:  Filed Vitals:   05/13/15 1413  BP: 102/72  Pulse: 71  Temp: 98.1 F (36.7 C)    There  is no weight on file to calculate BMI.  GENERAL: vitals reviewed and listed above, alert, oriented, appears well hydrated and in no acute distress  HEENT: atraumatic, conjunttiva clear, no obvious abnormalities on inspection of external nose and ears  NECK: no obvious masses on inspection  LUNGS: clear to auscultation bilaterally, no wheezes, rales or rhonchi, good air movement  CV: HRRR, no peripheral edema  MS: moves all extremities without noticeable abnormality, no CP wall TTP  PSYCH: pleasant and cooperative, anxious  ASSESSMENT AND PLAN:  Discussed the following assessment and plan:  Atypical chest pain - Plan: EKG 12-Lead  -suspect GERD and component of anxiety related to significant stressors -EKG looks good done for her concerns, she has had many and several workups in the past with  cardiology - advised exercise stress test, but she opted to hold off and do if not improving with PPI and possible CBT -advised labs but she reports all just done (she reports lipids, glu, tsh, cbc all done with gyn recently and ok) with gyn and declined -Patient advised to return or notify a doctor immediately if symptoms worsen or persist or new concerns arise.  Patient Instructions  BEFORE YOU LEAVE: -schedule follow up with Dr. Regis Bill in 1 month  Prilosec 20mg  (available over the counter) daily for 2 weeks  Please consider some regular counseling to help with stress  We placed a referral for you as discussed. It usually takes about 1-2 weeks to process and schedule this referral. If you have not heard from [place] regarding this appointment in 2 weeks please contact our office.  Seek care immediately if worsening or other concerns     KIM, Jarrett Soho R.

## 2015-06-14 ENCOUNTER — Ambulatory Visit (INDEPENDENT_AMBULATORY_CARE_PROVIDER_SITE_OTHER): Payer: 59 | Admitting: Internal Medicine

## 2015-06-14 ENCOUNTER — Encounter: Payer: Self-pay | Admitting: Internal Medicine

## 2015-06-14 VITALS — BP 114/74 | Temp 98.8°F | Ht 64.0 in

## 2015-06-14 DIAGNOSIS — J069 Acute upper respiratory infection, unspecified: Secondary | ICD-10-CM | POA: Diagnosis not present

## 2015-06-14 DIAGNOSIS — R0789 Other chest pain: Secondary | ICD-10-CM

## 2015-06-14 DIAGNOSIS — Z6379 Other stressful life events affecting family and household: Secondary | ICD-10-CM | POA: Diagnosis not present

## 2015-06-14 NOTE — Progress Notes (Signed)
Pre visit review using our clinic review tool, if applicable. No additional management support is needed unless otherwise documented below in the visit note.  Chief Complaint  Patient presents with  . Follow-up    chest sx poss gi now has resp sx    HPI: Sierra [name] 60 y.o. comes in for fu see last visit atypical chest pain  Had anxiety at times . Since then  But doing better   Daughter had thyroid cancer.     Surgery from    At the time  Papillary thyroid cancer  This week developed awakening breathing difficulties gasping  In upper chest  Used steam to help breath  and now congested was SOB but no lateral cp other  dayquil nyquil meds  . No fever .   Had throat clearing before onset . hasn;t taken ppi or acid blocker more than a few day s ROS: See pertinent positives and negatives per HPI.  Past Medical History  Diagnosis Date  . Meniere's disease     on diuretic therapy  . Tracheobronchopathia-osteochondroplastica 02/2010    involvement of trachea per FOB  . Normal nuclear stress test     Myoview 2006   . History of Doppler ultrasound     leg negative  2010 right  . History of Doppler ultrasound     right leg 2010 neg  . Shortness of breath     negative echo and stress echo 11/11 except for grade 2 diastolic dysfunction. EF is 60%. Normal CPX 11/11    Family History  Problem Relation Age of Onset  . Uterine cancer Mother     sister  . Asthma Father   . Hyperlipidemia Father   . Macular degeneration Father   . Allergies Father   . Prostate cancer Father   . Multiple sclerosis Sister   . Uterine cancer Sister   . Thyroid cancer Daughter 10    Social History   Social History  . Marital Status: Married    Spouse Name: N/A  . Number of Children: N/A  . Years of Education: N/A   Social History Main Topics  . Smoking status: Never Smoker   . Smokeless tobacco: Never Used  . Alcohol Use: 0.0 oz/week    0 Standard drinks or equivalent per week     Comment:  socially 1-2 a month  . Drug Use: No  . Sexual Activity: Yes   Other Topics Concern  . 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 estradiol (ESTRACE) 0.1 MG/GM vaginal cream Place 2 g vaginally daily. Using 2-3 times weekly    . triamterene-hydrochlorothiazide (MAXZIDE-25) 37.5-25 MG per tablet Take 1 tablet by mouth. Every other day     No facility-administered medications prior to visit.     EXAM:  BP 114/74 mmHg  Temp(Src) 98.8 F (37.1 C) (Oral)  Ht 5\' 4"  (1.626 m)  Wt   There is no weight on file to calculate BMI.  GENERAL: vitals reviewed and listed above, alert, oriented, appears well hydrated and in no acute distress congested no stridor nl speech and  Resp HEENT: atraumatic, conjunctiva  clear, no obvious abnormalities on inspection of external nose and ears face non tender OP : no lesion edema or exudate  NECK: no obvious masses on inspection palpation  LUNGS:  clear to auscultation bilaterally, no wheezes, rales or rhonchi, good air movement CV: HRRR, no clubbing cyanosis or  peripheral edema nl cap refill  MS: moves all extremities without noticeable focal  abnormality PSYCH: pleasant and cooperative, no obvious depression or anxiety  ASSESSMENT AND PLAN:  Discussed the following assessment and plan:  Atypical chest pain  Stress due to illness of family member  Acute upper respiratory infection  hx of Tracheobronchopathia-osteochondroplastica see past notes   Had sob at the time  Today seems like viral resp infection and underlying stress and poss reflux  Pt agrees follow   Trial ppi  Anyway if  persistent or progressive sx consider see ent or pulm   -Patient advised to return or notify health care team  if symptoms worsen ,persist or new concerns arise.  Patient Instructions  I agree doesn't sound CV in cause  . Throat clearing can  Occur with silent reflux .  This is probably a respiratory infection croup like ...    Exam is reassuring.  Can try  Acid blocker for     3-6  weeks    And see how does.   If ongoing  Problems  We can consider seeing ENT .  Pulm follow up.   Standley Brooking. Daran Favaro M.D.

## 2015-06-14 NOTE — Patient Instructions (Signed)
I agree doesn't sound CV in cause . Throat clearing can  Occur with silent reflux .  This is probably a respiratory infection croup like ...    Exam is reassuring.  Can try  Acid blocker for     3-6  weeks    And see how does.   If ongoing  Problems  We can consider seeing ENT .  Pulm follow up.

## 2015-09-07 ENCOUNTER — Other Ambulatory Visit (INDEPENDENT_AMBULATORY_CARE_PROVIDER_SITE_OTHER): Payer: 59

## 2015-09-07 ENCOUNTER — Encounter: Payer: Self-pay | Admitting: Family Medicine

## 2015-09-07 ENCOUNTER — Ambulatory Visit (INDEPENDENT_AMBULATORY_CARE_PROVIDER_SITE_OTHER): Payer: 59 | Admitting: Family Medicine

## 2015-09-07 VITALS — BP 116/76 | HR 64

## 2015-09-07 DIAGNOSIS — M222X1 Patellofemoral disorders, right knee: Secondary | ICD-10-CM

## 2015-09-07 DIAGNOSIS — M25561 Pain in right knee: Secondary | ICD-10-CM

## 2015-09-07 MED ORDER — DICLOFENAC SODIUM 2 % TD SOLN: TRANSDERMAL | Status: DC

## 2015-09-07 NOTE — Patient Instructions (Signed)
Good to see you.  Ice 20 minutes 2 times daily. Usually after activity and before bed. Exercises 3 times a week. Talk to [facility] and have her strengthen the VMO Cycling would be good for cardio pennsaid pinkie amount topically 2 times daily as needed.  Vitamin D 2000 IU daily  Turmeric 500mg  twice daily  Spenco orthotics "total support" online would be great  See me again in 3-4 weeks

## 2015-09-07 NOTE — Progress Notes (Signed)
[facility] [place], [place] [address] Phone: [phone] Subjective:    I'm seeing this patient by the request  of:  Lottie Dawson, MD   CC: right knee pain  [id number] Sierra [name] is a 60 y.o. female coming in with complaint of right knee pain. Patient was seen previously by an orthopedic surgeon back in 2012. Did have an acute lateral meniscal tear. Patient did have surgery for it. Patient continues to try to be active including playing tennis. Patient states now she has some instability noted on the anterior aspect of the knee. States that even going from a seated to standing position sometimes she has an audible pop with a pain. States that this is been 6 weeks since the last sound but continues to have some pain. Patient feels like it is just not improving. Patient denies locking or giving out on her. Patient rates the severity of pain now is 3 out of 10 when it occurred initially 7 out of 10. Patient is concerned because is seems to be increasing in frequency as well as severity. Once to avoid any other type of surgery.     Past Medical History  Diagnosis Date  . Meniere's disease     on diuretic therapy  . Tracheobronchopathia-osteochondroplastica 02/2010    involvement of trachea per FOB  . Normal nuclear stress test     Myoview 2006   . History of Doppler ultrasound     leg negative  2010 right  . History of Doppler ultrasound     right leg 2010 neg  . Shortness of breath     negative echo and stress echo 11/11 except for grade 2 diastolic dysfunction. EF is 60%. Normal CPX 11/11   Past Surgical History  Procedure Laterality Date  . Myoview  2006    stress- neg  . Doppler echocardiography  2010    rt leg neg  . Skin graft  1963  . Knee arthroscopy  2003    right  . Bronchoscopy  10/11  . Knee arthroscopy  05/11/2011    Procedure: ARTHROSCOPY KNEE;  Surgeon: Lorn Junes, MD;  Location: [facility];  Service: Orthopedics;  Laterality: Right;  with lateral meniscectomy   Social History   Social History  . Marital Status: Married    Spouse Name: N/A  . Number of Children: N/A  . Years of Education: N/A   Social History Main Topics  . Smoking status: Never Smoker   . Smokeless tobacco: Never Used  . Alcohol Use: 0.0 oz/week    0 Standard drinks or equivalent per week     Comment: socially 1-2 a month  . Drug Use: No  . Sexual Activity: Yes   Other Topics Concern  . 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 Morphine   . Tributyl Phosphate Hives  . Latex Rash   Family History  Problem Relation Age of Onset  . Uterine cancer Mother     sister  . Asthma Father   . Hyperlipidemia Father   . Macular degeneration Father   . Allergies Father   . Prostate cancer Father   . Multiple sclerosis Sister   . Uterine cancer Sister   . Thyroid cancer Daughter 84    Past medical history, social, surgical  and family history all reviewed in electronic medical record.  No pertanent information unless stated regarding to the chief complaint.   Review of Systems: No headache, visual changes, nausea, vomiting, diarrhea, constipation, dizziness, abdominal pain, skin rash, fevers, chills, night sweats, weight loss, swollen lymph nodes, body aches, joint swelling, muscle aches, chest pain, shortness of breath, mood changes.   Objective There were no vitals taken for this visit.  General: No apparent distress alert and oriented x3 mood and affect normal, dressed appropriately.  HEENT: Pupils equal, extraocular movements intact  Respiratory: Patient's speak in full sentences and does not appear short of breath  Cardiovascular: No lower extremity edema, non tender, no erythema  Skin: Warm dry intact with no signs of infection or rash on extremities or on axial skeleton.  Abdomen:  Soft nontender  Neuro: Cranial nerves II through XII are intact, neurovascularly intact in all extremities with 2+ DTRs and 2+ pulses.  Lymph: No lymphadenopathy of posterior or anterior cervical chain or axillae bilaterally.  Gait normal with good balance and coordination.  MSK:  Non tender with full range of motion and good stability and symmetric strength and tone of shoulders, elbows, wrist, hip, and ankles bilaterally.  Knee:right Lateral tilt of the patella Palpation normal with no warmth, joint line tenderness, patellar tenderness, or condyle tenderness. ROM full in flexion and extension and lower leg rotation.mild crepitus with range of motion Ligaments with solid consistent endpoints including ACL, PCL, LCL, MCL. Negative Mcmurray's, Apley's, and Thessalonian tests. painful patellar compression. Patellar glide with mildcrepitus. Patellar and quadriceps tendons unremarkable. Hamstring and quadriceps strength is normal.  Contralateral knee also has some lateral tilt to the patella but no grinding noted  MSK US performed of: right knee This study was ordered, performed, and interpreted by Charlann Boxer D.O.  Knee: All structures visualized. Moderate narrowing of the lateral joint space as well as the lateral patellofemoral joint Patellar Tendon unremarkable on long and transverse views without effusion. No abnormality of prepatellar bursa. LCL and MCL unremarkable on long and transverse views. No abnormality of origin of medial or lateral head of the gastrocnemius.  IMPRESSION:  Patellofemoral arthritis     Impression and Recommendations:     This case required medical decision making of moderate complexity.      Note: This dictation was prepared with Dragon dictation along with smaller phrase technology. Any transcriptional errors that result from this process are unintentional.

## 2015-09-07 NOTE — Assessment & Plan Note (Signed)
Patient does have patellofemoral arthritis noted. Patient does have some mild lateral tracking noted as well as some mild chronic subluxation. Patient given a brace today, home exercises and work with Product/process development scientist, icing regimen. Patient given a prescription for topical anti-inflammatories. We discussed which activities to avoid. Patient will come back again in 3-4 weeks. If worsening symptoms we'll consider formal physical therapy and injection.

## 2015-09-28 ENCOUNTER — Ambulatory Visit: Payer: 59 | Admitting: Family Medicine

## 2015-10-05 ENCOUNTER — Ambulatory Visit (INDEPENDENT_AMBULATORY_CARE_PROVIDER_SITE_OTHER): Payer: 59 | Admitting: Family Medicine

## 2015-10-05 ENCOUNTER — Encounter: Payer: Self-pay | Admitting: Family Medicine

## 2015-10-05 VITALS — BP 122/78 | HR 76

## 2015-10-05 DIAGNOSIS — M222X1 Patellofemoral disorders, right knee: Secondary | ICD-10-CM

## 2015-10-05 DIAGNOSIS — M25561 Pain in right knee: Secondary | ICD-10-CM

## 2015-10-05 NOTE — Assessment & Plan Note (Signed)
Respond well to conservative therapy. Patient will continue the topical anti-inflammatories as needed. Follow-up with me on an as-needed basis as well as patient does well. If worsening symptoms patient could be a candidate for formal physical therapy and potential injection.

## 2015-10-05 NOTE — Progress Notes (Signed)
[facility] [address], [place] [address] Phone: [phone] Subjective:    I'm seeing this patient by the request  of:  Lottie Dawson, MD   CC: right knee pain Follow-up  [id number] Sierra [name] is a 60 y.o. female coming in with complaint of right knee pain. Patient was seen previously by an orthopedic surgeon back in 2012. Did have an acute lateral meniscal tear.  Patient was seen and was diagnosed more of a patellofemoral arthritis. Patient has been wearing the brace, doing the exercises and occasionally taking the vitamins. Has noticed that she is feeling 60% improved. Feeling like she has more stability. When she wears the brace she has no pain whatsoever. Patient has been increasing her activity slowly. Denies any new symptoms, no swelling no radiation no numbness.     Past Medical History  Diagnosis Date  . Meniere's disease     on diuretic therapy  . Tracheobronchopathia-osteochondroplastica 02/2010    involvement of trachea per FOB  . Normal nuclear stress test     Myoview 2006   . History of Doppler ultrasound     leg negative  2010 right  . History of Doppler ultrasound     right leg 2010 neg  . Shortness of breath     negative echo and stress echo 11/11 except for grade 2 diastolic dysfunction. EF is 60%. Normal CPX 11/11   Past Surgical History  Procedure Laterality Date  . Myoview  2006    stress- neg  . Doppler echocardiography  2010    rt leg neg  . Skin graft  1963  . Knee arthroscopy  2003    right  . Bronchoscopy  10/11  . Knee arthroscopy  05/11/2011    Procedure: ARTHROSCOPY KNEE;  Surgeon: Lorn Junes, MD;  Location: [facility];  Service: Orthopedics;  Laterality: Right;  with lateral meniscectomy   Social History   Social History  . Marital Status: Married    Spouse Name: N/A  . Number of Children: N/A  . Years of Education: N/A   Social History Main Topics  .  Smoking status: Never Smoker   . Smokeless tobacco: Never Used  . Alcohol Use: 0.0 oz/week    0 Standard drinks or equivalent per week     Comment: socially 1-2 a month  . Drug Use: No  . Sexual Activity: Yes   Other Topics Concern  . None   Social History Narrative   Regular Exercise- Yes   Occupation: Lawyer   HH of 2   3 Children   Allergies  Allergen Reactions  . Morphine   . Tributyl Phosphate Hives  . Latex Rash   Family History  Problem Relation Age of Onset  . Uterine cancer Mother     sister  . Asthma Father   . Hyperlipidemia Father   . Macular degeneration Father   . Allergies Father   . Prostate cancer Father   . Multiple sclerosis Sister   . Uterine cancer Sister   . Thyroid cancer Daughter 29    Past medical history, social, surgical and family history all reviewed in electronic medical record.  No pertanent information unless stated regarding to the chief complaint.   Review of Systems: No headache, visual changes, nausea, vomiting, diarrhea, constipation, dizziness, abdominal pain, skin rash, fevers, chills, night sweats, weight loss, swollen lymph nodes, body aches, joint swelling, muscle aches, chest pain, shortness of breath, mood changes.  Objective Blood pressure 122/78, pulse 76.  General: No apparent distress alert and oriented x3 mood and affect normal, dressed appropriately.  HEENT: Pupils equal, extraocular movements intact  Respiratory: Patient's speak in full sentences and does not appear short of breath  Cardiovascular: No lower extremity edema, non tender, no erythema  Skin: Warm dry intact with no signs of infection or rash on extremities or on axial skeleton.  Abdomen: Soft nontender  Neuro: Cranial nerves II through XII are intact, neurovascularly intact in all extremities with 2+ DTRs and 2+ pulses.  Lymph: No lymphadenopathy of posterior or anterior cervical chain or axillae bilaterally.  Gait normal with good balance and  coordination.  MSK:  Non tender with full range of motion and good stability and symmetric strength and tone of shoulders, elbows, wrist, hip, and ankles bilaterally.  Knee:right Continued lateral tilt Animal tenderness over the lateral joint line ROM full in flexion and extension and lower leg rotation.mild crepitus with range of motion Ligaments with solid consistent endpoints including ACL, PCL, LCL, MCL. Negative Mcmurray's, Apley's, and Thessalonian tests. Non-painful patellar compression Patellar glide with mild crepitus still remaining but improved Patellar and quadriceps tendons unremarkable. Hamstring and quadriceps strength is normal.  Contralateral knee also has some lateral tilt to the patella but no grinding noted      Impression and Recommendations:     This case required medical decision making of moderate complexity.      Note: This dictation was prepared with Dragon dictation along with smaller phrase technology. Any transcriptional errors that result from this process are unintentional.

## 2015-10-05 NOTE — Patient Instructions (Signed)
Good to see you You are doig great  Wear the brace with a lot of activity especially working out still.  Ice is s till a good idea. Vitamin D is key and try to remember Tennis after memorial day 1 time a week for 4 weeks then up to 2 times a week thereafter Start a walk-run progression: - Initially start one minute walking than one minute running for 20 mins in the first week,   then 25 mins during the second week, then 30 mins afterwards.  Once you have reached 30 mins: - Run 2 mins, then walk 1 min. -Then run 3 mins, and walk 1 min. -Then run 4 mins, and walk 1 min. -Then run 5 mins, and walk 1 min. -Slowly build up weekly to running 30 mins nonstop.  If painful at any of the steps, back up one step.  If worsening pain see me again!

## 2016-06-19 ENCOUNTER — Encounter: Payer: Self-pay | Admitting: Gastroenterology

## 2016-06-20 ENCOUNTER — Ambulatory Visit (INDEPENDENT_AMBULATORY_CARE_PROVIDER_SITE_OTHER): Payer: 59 | Admitting: Geriatric Medicine

## 2016-06-20 DIAGNOSIS — Z23 Encounter for immunization: Secondary | ICD-10-CM | POA: Diagnosis not present

## 2016-06-26 DIAGNOSIS — Z809 Family history of malignant neoplasm, unspecified: Secondary | ICD-10-CM | POA: Diagnosis not present

## 2016-06-26 DIAGNOSIS — Z01419 Encounter for gynecological examination (general) (routine) without abnormal findings: Secondary | ICD-10-CM | POA: Diagnosis not present

## 2016-06-29 ENCOUNTER — Telehealth: Payer: Self-pay | Admitting: Internal Medicine

## 2016-06-29 DIAGNOSIS — E2839 Other primary ovarian failure: Secondary | ICD-10-CM

## 2016-06-29 NOTE — Telephone Encounter (Signed)
Pt would like an order for a bone density. Pt has cpe on 08/01/16

## 2016-07-03 NOTE — Telephone Encounter (Signed)
Ok to order dexa  Dx estrogen deficiency

## 2016-07-03 NOTE — Telephone Encounter (Signed)
Ok to order 

## 2016-07-03 NOTE — Telephone Encounter (Signed)
Order placed in Epic.

## 2016-07-26 ENCOUNTER — Other Ambulatory Visit (INDEPENDENT_AMBULATORY_CARE_PROVIDER_SITE_OTHER): Payer: 59

## 2016-07-26 DIAGNOSIS — Z0000 Encounter for general adult medical examination without abnormal findings: Secondary | ICD-10-CM | POA: Diagnosis not present

## 2016-07-26 LAB — CBC WITH DIFFERENTIAL/PLATELET
BASOS ABS: 0.1 10*3/uL (ref 0.0–0.1)
Basophils Relative: 1.3 % (ref 0.0–3.0)
EOS ABS: 0.3 10*3/uL (ref 0.0–0.7)
Eosinophils Relative: 5.9 % — ABNORMAL HIGH (ref 0.0–5.0)
HCT: 39 % (ref 36.0–46.0)
HEMOGLOBIN: 13 g/dL (ref 12.0–15.0)
LYMPHS PCT: 32.9 % (ref 12.0–46.0)
Lymphs Abs: 1.5 10*3/uL (ref 0.7–4.0)
MCHC: 33.4 g/dL (ref 30.0–36.0)
MCV: 90.4 fl (ref 78.0–100.0)
MONO ABS: 0.4 10*3/uL (ref 0.1–1.0)
Monocytes Relative: 8.4 % (ref 3.0–12.0)
NEUTROS ABS: 2.3 10*3/uL (ref 1.4–7.7)
Neutrophils Relative %: 51.5 % (ref 43.0–77.0)
PLATELETS: 238 10*3/uL (ref 150.0–400.0)
RBC: 4.31 Mil/uL (ref 3.87–5.11)
RDW: 13.8 % (ref 11.5–15.5)
WBC: 4.4 10*3/uL (ref 4.0–10.5)

## 2016-07-26 LAB — LIPID PANEL
Cholesterol: 210 mg/dL — ABNORMAL HIGH (ref 0–200)
HDL: 85.5 mg/dL (ref >39.00)
LDL CALC: 112 mg/dL — AB (ref 0–99)
NONHDL: 124.09
TRIGLYCERIDES: 59 mg/dL (ref 0.0–149.0)
Total CHOL/HDL Ratio: 2
VLDL: 11.8 mg/dL (ref 0.0–40.0)

## 2016-07-26 LAB — BASIC METABOLIC PANEL
BUN: 16 mg/dL (ref 6–23)
CALCIUM: 9.1 mg/dL (ref 8.4–10.5)
CO2: 28 mEq/L (ref 19–32)
CREATININE: 0.65 mg/dL (ref 0.40–1.20)
Chloride: 111 mEq/L (ref 96–112)
GFR: 98.41 mL/min (ref >60.00)
Glucose, Bld: 97 mg/dL (ref 70–99)
Potassium: 4.1 mEq/L (ref 3.5–5.1)
Sodium: 143 mEq/L (ref 135–145)

## 2016-07-26 LAB — HEPATIC FUNCTION PANEL
ALK PHOS: 54 U/L (ref 39–117)
ALT: 20 U/L (ref 0–35)
AST: 18 U/L (ref 0–37)
Albumin: 3.9 g/dL (ref 3.5–5.2)
BILIRUBIN DIRECT: 0.1 mg/dL (ref 0.0–0.3)
BILIRUBIN TOTAL: 0.4 mg/dL (ref 0.2–1.2)
TOTAL PROTEIN: 6.2 g/dL (ref 6.0–8.3)

## 2016-07-26 LAB — TSH: TSH: 1.34 u[IU]/mL (ref 0.35–4.50)

## 2016-07-31 NOTE — Progress Notes (Signed)
Chief Complaint  Patient presents with  . Annual Exam    HPI: Patient  Sierra [name]  61 y.o. comes in today for Preventive Health Care visit  Doing ok  Father passwed away in last year. Had been exercising reg but not recently. Had ssca remove from lower leg . menieres  On med to dee Dr Thornell Mule. utd pap etc  Gets ovass chest sx stress in am some  nsome throat clearing  With exrcise . Has hx of stablebronchila condition  See years ago .  No fracture hx .  Health Maintenance  Topic Date Due  . Hepatitis C Screening  08/01/2017 (Originally 06-20-1955)  . HIV Screening  08/01/2017 (Originally 04/29/1971)  . MAMMOGRAM  05/28/2018  . PAP SMEAR  05/28/2018  . COLONOSCOPY  08/06/2020  . TETANUS/TDAP  08/02/2026  . INFLUENZA VACCINE  Completed   Health Maintenance Review LIFESTYLE:  Exercise:  pilates yes  Tobacco/ETS:n Alcohol: s Sugar beverages:n Sleep: k Drug use: no   ROS:  GEN/ HEENT: No fever, significant weight changes sweats headaches vision problems hearing changes, CV/ PULM; No chest pain shortness of breath cough, syncope,edema  change in exercise tolerance. GI /GU: No adominal pain, vomiting, change in bowel habits. No blood in the stool. No significant GU symptoms. SKIN/HEME: ,no acute skin rashes suspicious lesions or bleeding. No lymphadenopathy, nodules, masses.  NEURO/ PSYCH:  No neurologic signs such as weakness numbness. No depression anxiety. IMM/ Allergy: No unusual infections.  Allergy .   REST of 12 system review negative except as per HPI   Past Medical History:  Diagnosis Date  . History of Doppler ultrasound    leg negative  2010 right  . History of Doppler ultrasound    right leg 2010 neg  . Meniere's disease    on diuretic therapy  . Normal nuclear stress test    Myoview 2006   . Shortness of breath    negative echo and stress echo 11/11 except for grade 2 diastolic dysfunction. EF is 60%. Normal CPX 11/11  .  Tracheobronchopathia-osteochondroplastica 02/2010   involvement of trachea per FOB    Past Surgical History:  Procedure Laterality Date  . BRONCHOSCOPY  10/11  . DOPPLER ECHOCARDIOGRAPHY  2010   rt leg neg  . KNEE ARTHROSCOPY  2003   right  . KNEE ARTHROSCOPY  05/11/2011   Procedure: ARTHROSCOPY KNEE;  Surgeon: Lorn Junes, MD;  Location: [facility];  Service: Orthopedics;  Laterality: Right;  with lateral meniscectomy  . myoview  2006   stress- neg  . SKIN GRAFT  1963    Family History  Problem Relation Age of Onset  . Uterine cancer Mother     sister  . Asthma Father   . Hyperlipidemia Father   . Macular degeneration Father   . Allergies Father   . Prostate cancer Father   . Multiple sclerosis Sister   . Uterine cancer Sister   . Thyroid cancer Daughter     Social History   Social History  . Marital status: Married    Spouse name: N/A  . Number of children: N/A  . Years of education: N/A   Social History Main Topics  . Smoking status: Never Smoker  . Smokeless tobacco: Never Used  . Alcohol use 0.0 oz/week     Comment: socially 1-2 a month  . Drug use: No  . Sexual activity: Yes   Other Topics Concern  . None   Social History  Narrative   Regular Exercise- Yes   Occupation: Lawyer   HH of 2   3 Children    Outpatient Medications Prior to Visit  Medication Sig Dispense Refill  . estradiol (ESTRACE) 0.1 MG/GM vaginal cream Place 2 g vaginally daily. Using 2-3 times weekly    . triamterene-hydrochlorothiazide (MAXZIDE-25) 37.5-25 MG per tablet Take 1 tablet by mouth. Every other day    . Diclofenac Sodium 2 % SOLN Apply twice daily to affected area as needed 1 Bottle 2   No facility-administered medications prior to visit.      EXAM:  BP 110/70 (BP Location: Right Arm, Patient Position: Sitting, Cuff Size: Normal)   Pulse 86   Temp 98.3 F (36.8 C) (Oral)   Ht 5' 3.5" (1.613 m)   Wt 160 lb 11.2 oz (72.9 kg)   BMI 28.02 kg/m    Body mass index is 28.02 kg/m. Wt Readings from Last 3 Encounters:  08/01/16 160 lb 11.2 oz (72.9 kg)  12/13/14 156 lb 9.6 oz (71 kg)  12/10/14 157 lb (71.2 kg)    Physical Exam: Vital signs reviewed GEN:This is a well-developed well-nourished alert cooperative    who appearsr stated age in no acute distress.  HEENT: normocephalic atraumatic , Eyes: PERRL EOM's full, conjunctiva clear, Nares: paten,t no deformity discharge or tenderness., Ears: no deformity EAC's clear TMs with normal landmarks. Mouth: clear OP, no lesions, edema.  Moist mucous membranes. Dentition in adequate repair. NECK: supple without masses, thyromegaly or bruits. CHEST/PULM:  Clear to auscultation and percussion breath sounds equal no wheeze , rales or rhonchi. No chest wall deformities or tenderness. Breast: per gyne  CV: PMI is nondisplaced, S1 S2 no gallops, murmurs, rubs. Peripheral pulses are full without delay.No JVD .  ABDOMEN: Bowel sounds normal nontender  No guard or rebound, no hepato splenomegal no CVA tenderness.  No hernia. Extremtities:  No clubbing cyanosis or edema, no acute joint swelling or redness no focal atrophy NEURO:  Oriented x3, cranial nerves 3-12 appear to be intact, no obvious focal weakness,gait within normal limits no abnormal reflexes or asymmetrical SKIN: No acute rashes normal turgor, color, no bruising or petechiae. PSYCH: Oriented, good eye contact, no obvious depression anxiety, cognition and judgment appear normal. LN: no cervical axillary inguinal adenopathy  Lab Results  Component Value Date   WBC 4.4 07/26/2016   HGB 13.0 07/26/2016   HCT 39.0 07/26/2016   PLT 238.0 07/26/2016   GLUCOSE 97 07/26/2016   CHOL 210 (H) 07/26/2016   TRIG 59.0 07/26/2016   HDL 85.50 07/26/2016   LDLDIRECT 112.2 08/22/2012   LDLCALC 112 (H) 07/26/2016   ALT 20 07/26/2016   AST 18 07/26/2016   NA 143 07/26/2016   K 4.1 07/26/2016   CL 111 07/26/2016   CREATININE 0.65 07/26/2016   BUN  16 07/26/2016   CO2 28 07/26/2016   TSH 1.34 07/26/2016   INR 1.2 (H) 09/21/2010   HGBA1C 5.9 08/22/2012    BP Readings from Last 3 Encounters:  08/01/16 110/70  10/05/15 122/78  09/07/15 116/76   Wt Readings from Last 3 Encounters:  08/01/16 160 lb 11.2 oz (72.9 kg)  12/13/14 156 lb 9.6 oz (71 kg)  12/10/14 157 lb (71.2 kg)    Lab results reviewed with patient   ASSESSMENT AND PLAN:  Discussed the following assessment and plan:  Visit for preventive health examination  Estrogen deficiency - Plan: DG Bone Density  Need for prophylactic vaccination with tetanus-diphtheria (TD) - Plan:   Td vaccine greater than or equal to 7yo preservative free IM dexa ordered   Disc throat clearing reflux vs other  Trail  No alarm sx noted    Expectant management.  Colon scren when due . Patient Care Team:  K , MD as PCP - General Robert Sypher, MD (Inactive) (Orthopedic Surgery) Sherry A Dickstein, MD (Inactive) (Obstetrics and Gynecology) Daniel R Bensimhon, MD as Attending Physician (Cardiology) Eric Kraus, MD as Attending Physician (Otolaryngology) Patient Instructions    Continue lifestyle intervention healthy eating and exercise . Blood work is good. Get your bone density let you know these results. Continue increasing exercise as tolerated. You can try ranitidine 150 mg twice a day for 3-4 weeks to see if the throat clearing improves. This is the same thing as generic Zantac. Which treats in blocks acid in the esophagus. Let us know if we can help if persistent or progressive symptoms.   Health Maintenance, Female Adopting a healthy lifestyle and getting preventive care can go a long way to promote health and wellness. Talk with your health care provider about what schedule of regular examinations is right for you. This is a good chance for you to check in with your provider about disease prevention and staying healthy. In between checkups, there are plenty of things  you can do on your own. Experts have done a lot of research about which lifestyle changes and preventive measures are most likely to keep you healthy. Ask your health care provider for more information. Weight and diet Eat a healthy diet  Be sure to include plenty of vegetables, fruits, low-fat dairy products, and lean protein.  Do not eat a lot of foods high in solid fats, added sugars, or salt.  Get regular exercise. This is one of the most important things you can do for your health.  Most adults should exercise for at least 150 minutes each week. The exercise should increase your heart rate and make you sweat (moderate-intensity exercise).  Most adults should also do strengthening exercises at least twice a week. This is in addition to the moderate-intensity exercise. Maintain a healthy weight  Body mass index (BMI) is a measurement that can be used to identify possible weight problems. It estimates body fat based on height and weight. Your health care provider can help determine your BMI and help you achieve or maintain a healthy weight.  For females 20 years of age and older:  A BMI below 18.5 is considered underweight.  A BMI of 18.5 to 24.9 is normal.  A BMI of 25 to 29.9 is considered overweight.  A BMI of 30 and above is considered obese. Watch levels of cholesterol and blood lipids  You should start having your blood tested for lipids and cholesterol at 62 years of age, then have this test every 5 years.  You may need to have your cholesterol levels checked more often if:  Your lipid or cholesterol levels are high.  You are older than 62 years of age.  You are at high risk for heart disease. Cancer screening Lung Cancer  Lung cancer screening is recommended for adults 55-80 years old who are at high risk for lung cancer because of a history of smoking.  A yearly low-dose CT scan of the lungs is recommended for people who:  Currently smoke.  Have quit within  the past 15 years.  Have at least a 30-pack-year history of smoking. A pack year is smoking an average of one pack of   cigarettes a day for 1 year.  Yearly screening should continue until it has been 15 years since you quit.  Yearly screening should stop if you develop a health problem that would prevent you from having lung cancer treatment. Breast Cancer  Practice breast self-awareness. This means understanding how your breasts normally appear and feel.  It also means doing regular breast self-exams. Let your health care provider know about any changes, no matter how small.  If you are in your 20s or 30s, you should have a clinical breast exam (CBE) by a health care provider every 1-3 years as part of a regular health exam.  If you are 40 or older, have a CBE every year. Also consider having a breast X-ray (mammogram) every year.  If you have a family history of breast cancer, talk to your health care provider about genetic screening.  If you are at high risk for breast cancer, talk to your health care provider about having an MRI and a mammogram every year.  Breast cancer gene (BRCA) assessment is recommended for women who have family members with BRCA-related cancers. BRCA-related cancers include:  Breast.  Ovarian.  Tubal.  Peritoneal cancers.  Results of the assessment will determine the need for genetic counseling and BRCA1 and BRCA2 testing. Cervical Cancer  Your health care provider may recommend that you be screened regularly for cancer of the pelvic organs (ovaries, uterus, and vagina). This screening involves a pelvic examination, including checking for microscopic changes to the surface of your cervix (Pap test). You may be encouraged to have this screening done every 3 years, beginning at age 21.  For women ages 30-65, health care providers may recommend pelvic exams and Pap testing every 3 years, or they may recommend the Pap and pelvic exam, combined with testing for  human papilloma virus (HPV), every 5 years. Some types of HPV increase your risk of cervical cancer. Testing for HPV may also be done on women of any age with unclear Pap test results.  Other health care providers may not recommend any screening for nonpregnant women who are considered low risk for pelvic cancer and who do not have symptoms. Ask your health care provider if a screening pelvic exam is right for you.  If you have had past treatment for cervical cancer or a condition that could lead to cancer, you need Pap tests and screening for cancer for at least 20 years after your treatment. If Pap tests have been discontinued, your risk factors (such as having a new sexual partner) need to be reassessed to determine if screening should resume. Some women have medical problems that increase the chance of getting cervical cancer. In these cases, your health care provider may recommend more frequent screening and Pap tests. Colorectal Cancer  This type of cancer can be detected and often prevented.  Routine colorectal cancer screening usually begins at 62 years of age and continues through 62 years of age.  Your health care provider may recommend screening at an earlier age if you have risk factors for colon cancer.  Your health care provider may also recommend using home test kits to check for hidden blood in the stool.  A small camera at the end of a tube can be used to examine your colon directly (sigmoidoscopy or colonoscopy). This is done to check for the earliest forms of colorectal cancer.  Routine screening usually begins at age 50.  Direct examination of the colon should be repeated every 5-10 years through   62 years of age. However, you may need to be screened more often if early forms of precancerous polyps or small growths are found. Skin Cancer  Check your skin from head to toe regularly.  Tell your health care provider about any new moles or changes in moles, especially if there  is a change in a mole's shape or color.  Also tell your health care provider if you have a mole that is larger than the size of a pencil eraser.  Always use sunscreen. Apply sunscreen liberally and repeatedly throughout the day.  Protect yourself by wearing long sleeves, pants, a wide-brimmed hat, and sunglasses whenever you are outside. Heart disease, diabetes, and high blood pressure  High blood pressure causes heart disease and increases the risk of stroke. High blood pressure is more likely to develop in:  People who have blood pressure in the high end of the normal range (130-139/85-89 mm Hg).  People who are overweight or obese.  People who are African American.  If you are 40-51 years of age, have your blood pressure checked every 3-5 years. If you are 34 years of age or older, have your blood pressure checked every year. You should have your blood pressure measured twice-once when you are at a hospital or clinic, and once when you are not at a hospital or clinic. Record the average of the two measurements. To check your blood pressure when you are not at a hospital or clinic, you can use:  An automated blood pressure machine at a pharmacy.  A home blood pressure monitor.  If you are between 39 years and 74 years old, ask your health care provider if you should take aspirin to prevent strokes.  Have regular diabetes screenings. This involves taking a blood sample to check your fasting blood sugar level.  If you are at a normal weight and have a low risk for diabetes, have this test once every three years after 62 years of age.  If you are overweight and have a high risk for diabetes, consider being tested at a younger age or more often. Preventing infection Hepatitis B  If you have a higher risk for hepatitis B, you should be screened for this virus. You are considered at high risk for hepatitis B if:  You were born in a country where hepatitis B is common. Ask your health  care provider which countries are considered high risk.  Your parents were born in a high-risk country, and you have not been immunized against hepatitis B (hepatitis B vaccine).  You have HIV or AIDS.  You use needles to inject street drugs.  You live with someone who has hepatitis B.  You have had sex with someone who has hepatitis B.  You get hemodialysis treatment.  You take certain medicines for conditions, including cancer, organ transplantation, and autoimmune conditions. Hepatitis C  Blood testing is recommended for:  Everyone born from [birth year] through 1966.  Anyone with known risk factors for hepatitis C. Sexually transmitted infections (STIs)  You should be screened for sexually transmitted infections (STIs) including gonorrhea and chlamydia if:  You are sexually active and are younger than 62 years of age.  You are older than 62 years of age and your health care provider tells you that you are at risk for this type of infection.  Your sexual activity has changed since you were last screened and you are at an increased risk for chlamydia or gonorrhea. Ask your health care provider if  you are at risk.  If you do not have HIV, but are at risk, it may be recommended that you take a prescription medicine daily to prevent HIV infection. This is called pre-exposure prophylaxis (PrEP). You are considered at risk if:  You are sexually active and do not regularly use condoms or know the HIV status of your partner(s).  You take drugs by injection.  You are sexually active with a partner who has HIV. Talk with your health care provider about whether you are at high risk of being infected with HIV. If you choose to begin PrEP, you should first be tested for HIV. You should then be tested every 3 months for as long as you are taking PrEP. Pregnancy  If you are premenopausal and you may become pregnant, ask your health care provider about preconception counseling.  If you may  become pregnant, take 400 to 800 micrograms (mcg) of folic acid every day.  If you want to prevent pregnancy, talk to your health care provider about birth control (contraception). Osteoporosis and menopause  Osteoporosis is a disease in which the bones lose minerals and strength with aging. This can result in serious bone fractures. Your risk for osteoporosis can be identified using a bone density scan.  If you are 9 years of age or older, or if you are at risk for osteoporosis and fractures, ask your health care provider if you should be screened.  Ask your health care provider whether you should take a calcium or vitamin D supplement to lower your risk for osteoporosis.  Menopause may have certain physical symptoms and risks.  Hormone replacement therapy may reduce some of these symptoms and risks. Talk to your health care provider about whether hormone replacement therapy is right for you. Follow these instructions at home:  Schedule regular health, dental, and eye exams.  Stay current with your immunizations.  Do not use any tobacco products including cigarettes, chewing tobacco, or electronic cigarettes.  If you are pregnant, do not drink alcohol.  If you are breastfeeding, limit how much and how often you drink alcohol.  Limit alcohol intake to no more than 1 drink per day for nonpregnant women. One drink equals 12 ounces of beer, 5 ounces of wine, or 1 ounces of hard liquor.  Do not use street drugs.  Do not share needles.  Ask your health care provider for help if you need support or information about quitting drugs.  Tell your health care provider if you often feel depressed.  Tell your health care provider if you have ever been abused or do not feel safe at home. This information is not intended to replace advice given to you by your health care provider. Make sure you discuss any questions you have with your health care provider. Document Released: 11/27/2010  Document Revised: 10/20/2015 Document Reviewed: 02/15/2015 Elsevier Interactive Patient Education  2017 [place]. Jujhar Everett M.D.

## 2016-08-01 ENCOUNTER — Encounter: Payer: Self-pay | Admitting: Internal Medicine

## 2016-08-01 ENCOUNTER — Ambulatory Visit (INDEPENDENT_AMBULATORY_CARE_PROVIDER_SITE_OTHER): Payer: 59 | Admitting: Internal Medicine

## 2016-08-01 VITALS — BP 110/70 | HR 86 | Temp 98.3°F | Ht 63.5 in | Wt 160.7 lb

## 2016-08-01 DIAGNOSIS — E2839 Other primary ovarian failure: Secondary | ICD-10-CM | POA: Diagnosis not present

## 2016-08-01 DIAGNOSIS — Z0000 Encounter for general adult medical examination without abnormal findings: Secondary | ICD-10-CM | POA: Diagnosis not present

## 2016-08-01 DIAGNOSIS — Z23 Encounter for immunization: Secondary | ICD-10-CM | POA: Diagnosis not present

## 2016-08-01 NOTE — Patient Instructions (Addendum)
Continue lifestyle intervention healthy eating and exercise . Blood work is good. Get your bone density let you know these results. Continue increasing exercise as tolerated. You can try ranitidine 150 mg twice a day for 3-4 weeks to see if the throat clearing improves. This is the same thing as generic Zantac. Which treats in blocks acid in the esophagus. Let us know if we can help if persistent or progressive symptoms.   Health Maintenance, Female Adopting a healthy lifestyle and getting preventive care can go a long way to promote health and wellness. Talk with your health care provider about what schedule of regular examinations is right for you. This is a good chance for you to check in with your provider about disease prevention and staying healthy. In between checkups, there are plenty of things you can do on your own. Experts have done a lot of research about which lifestyle changes and preventive measures are most likely to keep you healthy. Ask your health care provider for more information. Weight and diet Eat a healthy diet  Be sure to include plenty of vegetables, fruits, low-fat dairy products, and lean protein.  Do not eat a lot of foods high in solid fats, added sugars, or salt.  Get regular exercise. This is one of the most important things you can do for your health.  Most adults should exercise for at least 150 minutes each week. The exercise should increase your heart rate and make you sweat (moderate-intensity exercise).  Most adults should also do strengthening exercises at least twice a week. This is in addition to the moderate-intensity exercise. Maintain a healthy weight  Body mass index (BMI) is a measurement that can be used to identify possible weight problems. It estimates body fat based on height and weight. Your health care provider can help determine your BMI and help you achieve or maintain a healthy weight.  For females 41 years of age and older:  A  BMI below 18.5 is considered underweight.  A BMI of 18.5 to 24.9 is normal.  A BMI of 25 to 29.9 is considered overweight.  A BMI of 30 and above is considered obese. Watch levels of cholesterol and blood lipids  You should start having your blood tested for lipids and cholesterol at 62 years of age, then have this test every 5 years.  You may need to have your cholesterol levels checked more often if:  Your lipid or cholesterol levels are high.  You are older than 62 years of age.  You are at high risk for heart disease. Cancer screening Lung Cancer  Lung cancer screening is recommended for adults 71-103 years old who are at high risk for lung cancer because of a history of smoking.  A yearly low-dose CT scan of the lungs is recommended for people who:  Currently smoke.  Have quit within the past 15 years.  Have at least a 30-pack-year history of smoking. A pack year is smoking an average of one pack of cigarettes a day for 1 year.  Yearly screening should continue until it has been 15 years since you quit.  Yearly screening should stop if you develop a health problem that would prevent you from having lung cancer treatment. Breast Cancer  Practice breast self-awareness. This means understanding how your breasts normally appear and feel.  It also means doing regular breast self-exams. Let your health care provider know about any changes, no matter how small.  If you are in your 23s  or 83s, you should have a clinical breast exam (CBE) by a health care provider every 1-3 years as part of a regular health exam.  If you are 3 or older, have a CBE every year. Also consider having a breast X-ray (mammogram) every year.  If you have a family history of breast cancer, talk to your health care provider about genetic screening.  If you are at high risk for breast cancer, talk to your health care provider about having an MRI and a mammogram every year.  Breast cancer gene (BRCA)  assessment is recommended for women who have family members with BRCA-related cancers. BRCA-related cancers include:  Breast.  Ovarian.  Tubal.  Peritoneal cancers.  Results of the assessment will determine the need for genetic counseling and BRCA1 and BRCA2 testing. Cervical Cancer  Your health care provider may recommend that you be screened regularly for cancer of the pelvic organs (ovaries, uterus, and vagina). This screening involves a pelvic examination, including checking for microscopic changes to the surface of your cervix (Pap test). You may be encouraged to have this screening done every 3 years, beginning at age 53.  For women ages 94-65, health care providers may recommend pelvic exams and Pap testing every 3 years, or they may recommend the Pap and pelvic exam, combined with testing for human papilloma virus (HPV), every 5 years. Some types of HPV increase your risk of cervical cancer. Testing for HPV may also be done on women of any age with unclear Pap test results.  Other health care providers may not recommend any screening for nonpregnant women who are considered low risk for pelvic cancer and who do not have symptoms. Ask your health care provider if a screening pelvic exam is right for you.  If you have had past treatment for cervical cancer or a condition that could lead to cancer, you need Pap tests and screening for cancer for at least 20 years after your treatment. If Pap tests have been discontinued, your risk factors (such as having a new sexual partner) need to be reassessed to determine if screening should resume. Some women have medical problems that increase the chance of getting cervical cancer. In these cases, your health care provider may recommend more frequent screening and Pap tests. Colorectal Cancer  This type of cancer can be detected and often prevented.  Routine colorectal cancer screening usually begins at 62 years of age and continues through 62  years of age.  Your health care provider may recommend screening at an earlier age if you have risk factors for colon cancer.  Your health care provider may also recommend using home test kits to check for hidden blood in the stool.  A small camera at the end of a tube can be used to examine your colon directly (sigmoidoscopy or colonoscopy). This is done to check for the earliest forms of colorectal cancer.  Routine screening usually begins at age 73.  Direct examination of the colon should be repeated every 5-10 years through 62 years of age. However, you may need to be screened more often if early forms of precancerous polyps or small growths are found. Skin Cancer  Check your skin from head to toe regularly.  Tell your health care provider about any new moles or changes in moles, especially if there is a change in a mole's shape or color.  Also tell your health care provider if you have a mole that is larger than the size of a pencil eraser.  Always use sunscreen. Apply sunscreen liberally and repeatedly throughout the day.  Protect yourself by wearing long sleeves, pants, a wide-brimmed hat, and sunglasses whenever you are outside. Heart disease, diabetes, and high blood pressure  High blood pressure causes heart disease and increases the risk of stroke. High blood pressure is more likely to develop in:  People who have blood pressure in the high end of the normal range (130-139/85-89 mm Hg).  People who are overweight or obese.  People who are African American.  If you are 45-24 years of age, have your blood pressure checked every 3-5 years. If you are 81 years of age or older, have your blood pressure checked every year. You should have your blood pressure measured twice-once when you are at a hospital or clinic, and once when you are not at a hospital or clinic. Record the average of the two measurements. To check your blood pressure when you are not at a hospital or clinic,  you can use:  An automated blood pressure machine at a pharmacy.  A home blood pressure monitor.  If you are between 2 years and 68 years old, ask your health care provider if you should take aspirin to prevent strokes.  Have regular diabetes screenings. This involves taking a blood sample to check your fasting blood sugar level.  If you are at a normal weight and have a low risk for diabetes, have this test once every three years after 62 years of age.  If you are overweight and have a high risk for diabetes, consider being tested at a younger age or more often. Preventing infection Hepatitis B  If you have a higher risk for hepatitis B, you should be screened for this virus. You are considered at high risk for hepatitis B if:  You were born in a country where hepatitis B is common. Ask your health care provider which countries are considered high risk.  Your parents were born in a high-risk country, and you have not been immunized against hepatitis B (hepatitis B vaccine).  You have HIV or AIDS.  You use needles to inject street drugs.  You live with someone who has hepatitis B.  You have had sex with someone who has hepatitis B.  You get hemodialysis treatment.  You take certain medicines for conditions, including cancer, organ transplantation, and autoimmune conditions. Hepatitis C  Blood testing is recommended for:  Everyone born from [birth year] through 1966.  Anyone with known risk factors for hepatitis C. Sexually transmitted infections (STIs)  You should be screened for sexually transmitted infections (STIs) including gonorrhea and chlamydia if:  You are sexually active and are younger than 62 years of age.  You are older than 62 years of age and your health care provider tells you that you are at risk for this type of infection.  Your sexual activity has changed since you were last screened and you are at an increased risk for chlamydia or gonorrhea. Ask your  health care provider if you are at risk.  If you do not have HIV, but are at risk, it may be recommended that you take a prescription medicine daily to prevent HIV infection. This is called pre-exposure prophylaxis (PrEP). You are considered at risk if:  You are sexually active and do not regularly use condoms or know the HIV status of your partner(s).  You take drugs by injection.  You are sexually active with a partner who has HIV. Talk with your health care provider  about whether you are at high risk of being infected with HIV. If you choose to begin PrEP, you should first be tested for HIV. You should then be tested every 3 months for as long as you are taking PrEP. Pregnancy  If you are premenopausal and you may become pregnant, ask your health care provider about preconception counseling.  If you may become pregnant, take 400 to 800 micrograms (mcg) of folic acid every day.  If you want to prevent pregnancy, talk to your health care provider about birth control (contraception). Osteoporosis and menopause  Osteoporosis is a disease in which the bones lose minerals and strength with aging. This can result in serious bone fractures. Your risk for osteoporosis can be identified using a bone density scan.  If you are 86 years of age or older, or if you are at risk for osteoporosis and fractures, ask your health care provider if you should be screened.  Ask your health care provider whether you should take a calcium or vitamin D supplement to lower your risk for osteoporosis.  Menopause may have certain physical symptoms and risks.  Hormone replacement therapy may reduce some of these symptoms and risks. Talk to your health care provider about whether hormone replacement therapy is right for you. Follow these instructions at home:  Schedule regular health, dental, and eye exams.  Stay current with your immunizations.  Do not use any tobacco products including cigarettes, chewing  tobacco, or electronic cigarettes.  If you are pregnant, do not drink alcohol.  If you are breastfeeding, limit how much and how often you drink alcohol.  Limit alcohol intake to no more than 1 drink per day for nonpregnant women. One drink equals 12 ounces of beer, 5 ounces of wine, or 1 ounces of hard liquor.  Do not use street drugs.  Do not share needles.  Ask your health care provider for help if you need support or information about quitting drugs.  Tell your health care provider if you often feel depressed.  Tell your health care provider if you have ever been abused or do not feel safe at home. This information is not intended to replace advice given to you by your health care provider. Make sure you discuss any questions you have with your health care provider. Document Released: 11/27/2010 Document Revised: 10/20/2015 Document Reviewed: 02/15/2015 Elsevier Interactive Patient Education  2017 [employer].

## 2016-08-08 DIAGNOSIS — M7581 Other shoulder lesions, right shoulder: Secondary | ICD-10-CM | POA: Diagnosis not present

## 2016-08-10 ENCOUNTER — Encounter: Payer: Self-pay | Admitting: Gastroenterology

## 2016-08-13 DIAGNOSIS — M25511 Pain in right shoulder: Secondary | ICD-10-CM | POA: Diagnosis not present

## 2016-08-13 DIAGNOSIS — S46011D Strain of muscle(s) and tendon(s) of the rotator cuff of right shoulder, subsequent encounter: Secondary | ICD-10-CM | POA: Diagnosis not present

## 2016-08-13 DIAGNOSIS — M67911 Unspecified disorder of synovium and tendon, right shoulder: Secondary | ICD-10-CM | POA: Diagnosis not present

## 2016-08-15 DIAGNOSIS — M25511 Pain in right shoulder: Secondary | ICD-10-CM | POA: Diagnosis not present

## 2016-08-15 DIAGNOSIS — M67911 Unspecified disorder of synovium and tendon, right shoulder: Secondary | ICD-10-CM | POA: Diagnosis not present

## 2016-08-15 DIAGNOSIS — S46011D Strain of muscle(s) and tendon(s) of the rotator cuff of right shoulder, subsequent encounter: Secondary | ICD-10-CM | POA: Diagnosis not present

## 2016-08-21 DIAGNOSIS — S46011D Strain of muscle(s) and tendon(s) of the rotator cuff of right shoulder, subsequent encounter: Secondary | ICD-10-CM | POA: Diagnosis not present

## 2016-08-21 DIAGNOSIS — M25511 Pain in right shoulder: Secondary | ICD-10-CM | POA: Diagnosis not present

## 2016-08-21 DIAGNOSIS — M67911 Unspecified disorder of synovium and tendon, right shoulder: Secondary | ICD-10-CM | POA: Diagnosis not present

## 2016-08-28 DIAGNOSIS — M67911 Unspecified disorder of synovium and tendon, right shoulder: Secondary | ICD-10-CM | POA: Diagnosis not present

## 2016-08-28 DIAGNOSIS — M25511 Pain in right shoulder: Secondary | ICD-10-CM | POA: Diagnosis not present

## 2016-08-28 DIAGNOSIS — S46011D Strain of muscle(s) and tendon(s) of the rotator cuff of right shoulder, subsequent encounter: Secondary | ICD-10-CM | POA: Diagnosis not present

## 2016-08-30 DIAGNOSIS — S46011D Strain of muscle(s) and tendon(s) of the rotator cuff of right shoulder, subsequent encounter: Secondary | ICD-10-CM | POA: Diagnosis not present

## 2016-08-30 DIAGNOSIS — M67911 Unspecified disorder of synovium and tendon, right shoulder: Secondary | ICD-10-CM | POA: Diagnosis not present

## 2016-08-30 DIAGNOSIS — M25511 Pain in right shoulder: Secondary | ICD-10-CM | POA: Diagnosis not present

## 2016-09-03 DIAGNOSIS — S46011D Strain of muscle(s) and tendon(s) of the rotator cuff of right shoulder, subsequent encounter: Secondary | ICD-10-CM | POA: Diagnosis not present

## 2016-09-03 DIAGNOSIS — M25511 Pain in right shoulder: Secondary | ICD-10-CM | POA: Diagnosis not present

## 2016-09-03 DIAGNOSIS — M67911 Unspecified disorder of synovium and tendon, right shoulder: Secondary | ICD-10-CM | POA: Diagnosis not present

## 2016-09-06 DIAGNOSIS — M67911 Unspecified disorder of synovium and tendon, right shoulder: Secondary | ICD-10-CM | POA: Diagnosis not present

## 2016-09-06 DIAGNOSIS — M25511 Pain in right shoulder: Secondary | ICD-10-CM | POA: Diagnosis not present

## 2016-09-06 DIAGNOSIS — S46011D Strain of muscle(s) and tendon(s) of the rotator cuff of right shoulder, subsequent encounter: Secondary | ICD-10-CM | POA: Diagnosis not present

## 2016-09-06 NOTE — Progress Notes (Signed)
Sierra Monday, CRNA approved this patient to be done here.   Pt came into the office for her PV today. Records show the pt had her last colon 08/07/2010 and it was normal. After discussing colon appt with pt, she states she does not have any family hx colon cancer, no bleeding, no changes in bowel habits or hx of colon polyps. It was too early for her colonoscopy at this time. Pt will cancel her pre-visit today and the colon on 10/05/16 was cancelled with Dr Silverio Decamp. The pt will call back to reschedule her colon if necessary. She will discuss the next colon with her PCP.

## 2016-09-11 DIAGNOSIS — S46011D Strain of muscle(s) and tendon(s) of the rotator cuff of right shoulder, subsequent encounter: Secondary | ICD-10-CM | POA: Diagnosis not present

## 2016-09-11 DIAGNOSIS — M67911 Unspecified disorder of synovium and tendon, right shoulder: Secondary | ICD-10-CM | POA: Diagnosis not present

## 2016-09-11 DIAGNOSIS — H8102 Meniere's disease, left ear: Secondary | ICD-10-CM | POA: Diagnosis not present

## 2016-09-11 DIAGNOSIS — M25511 Pain in right shoulder: Secondary | ICD-10-CM | POA: Diagnosis not present

## 2016-09-11 DIAGNOSIS — H903 Sensorineural hearing loss, bilateral: Secondary | ICD-10-CM | POA: Diagnosis not present

## 2016-09-13 DIAGNOSIS — M25511 Pain in right shoulder: Secondary | ICD-10-CM | POA: Diagnosis not present

## 2016-09-13 DIAGNOSIS — M67911 Unspecified disorder of synovium and tendon, right shoulder: Secondary | ICD-10-CM | POA: Diagnosis not present

## 2016-09-13 DIAGNOSIS — S46011D Strain of muscle(s) and tendon(s) of the rotator cuff of right shoulder, subsequent encounter: Secondary | ICD-10-CM | POA: Diagnosis not present

## 2016-09-20 ENCOUNTER — Ambulatory Visit (AMBULATORY_SURGERY_CENTER): Payer: Self-pay

## 2016-09-20 VITALS — Ht 64.0 in | Wt 160.6 lb

## 2016-09-20 DIAGNOSIS — D485 Neoplasm of uncertain behavior of skin: Secondary | ICD-10-CM | POA: Diagnosis not present

## 2016-09-20 DIAGNOSIS — L308 Other specified dermatitis: Secondary | ICD-10-CM | POA: Diagnosis not present

## 2016-09-20 DIAGNOSIS — D1801 Hemangioma of skin and subcutaneous tissue: Secondary | ICD-10-CM | POA: Diagnosis not present

## 2016-09-20 DIAGNOSIS — L821 Other seborrheic keratosis: Secondary | ICD-10-CM | POA: Diagnosis not present

## 2016-09-20 DIAGNOSIS — Z1211 Encounter for screening for malignant neoplasm of colon: Secondary | ICD-10-CM

## 2016-09-20 DIAGNOSIS — L814 Other melanin hyperpigmentation: Secondary | ICD-10-CM | POA: Diagnosis not present

## 2016-10-05 ENCOUNTER — Encounter: Payer: 59 | Admitting: Gastroenterology

## 2016-11-27 ENCOUNTER — Encounter: Payer: Self-pay | Admitting: Internal Medicine

## 2016-11-27 ENCOUNTER — Ambulatory Visit (INDEPENDENT_AMBULATORY_CARE_PROVIDER_SITE_OTHER): Payer: 59 | Admitting: Internal Medicine

## 2016-11-27 ENCOUNTER — Other Ambulatory Visit (INDEPENDENT_AMBULATORY_CARE_PROVIDER_SITE_OTHER): Payer: 59

## 2016-11-27 VITALS — BP 140/86 | HR 88 | Temp 98.0°F | Resp 18

## 2016-11-27 DIAGNOSIS — T502X5A Adverse effect of carbonic-anhydrase inhibitors, benzothiadiazides and other diuretics, initial encounter: Secondary | ICD-10-CM | POA: Diagnosis not present

## 2016-11-27 DIAGNOSIS — R000 Tachycardia, unspecified: Secondary | ICD-10-CM | POA: Diagnosis not present

## 2016-11-27 DIAGNOSIS — R0602 Shortness of breath: Secondary | ICD-10-CM

## 2016-11-27 DIAGNOSIS — R002 Palpitations: Secondary | ICD-10-CM

## 2016-11-27 DIAGNOSIS — E876 Hypokalemia: Secondary | ICD-10-CM

## 2016-11-27 LAB — CBC WITH DIFFERENTIAL/PLATELET
BASOS PCT: 1.1 % (ref 0.0–3.0)
Basophils Absolute: 0 10*3/uL (ref 0.0–0.1)
Eosinophils Absolute: 0.2 10*3/uL (ref 0.0–0.7)
Eosinophils Relative: 3.7 % (ref 0.0–5.0)
HEMATOCRIT: 43.1 % (ref 36.0–46.0)
Hemoglobin: 14.4 g/dL (ref 12.0–15.0)
LYMPHS ABS: 1.5 10*3/uL (ref 0.7–4.0)
LYMPHS PCT: 33.2 % (ref 12.0–46.0)
MCHC: 33.5 g/dL (ref 30.0–36.0)
MCV: 90.2 fl (ref 78.0–100.0)
MONOS PCT: 7 % (ref 3.0–12.0)
Monocytes Absolute: 0.3 10*3/uL (ref 0.1–1.0)
NEUTROS ABS: 2.6 10*3/uL (ref 1.4–7.7)
NEUTROS PCT: 55 % (ref 43.0–77.0)
PLATELETS: 252 10*3/uL (ref 150.0–400.0)
RBC: 4.78 Mil/uL (ref 3.87–5.11)
RDW: 13.8 % (ref 11.5–15.5)
WBC: 4.6 10*3/uL (ref 4.0–10.5)

## 2016-11-27 LAB — COMPREHENSIVE METABOLIC PANEL
ALT: 22 U/L (ref 0–35)
AST: 24 U/L (ref 0–37)
Albumin: 4.4 g/dL (ref 3.5–5.2)
Alkaline Phosphatase: 53 U/L (ref 39–117)
BILIRUBIN TOTAL: 0.4 mg/dL (ref 0.2–1.2)
BUN: 15 mg/dL (ref 6–23)
CALCIUM: 9.7 mg/dL (ref 8.4–10.5)
CHLORIDE: 103 meq/L (ref 96–112)
CO2: 30 meq/L (ref 19–32)
Creatinine, Ser: 0.81 mg/dL (ref 0.40–1.20)
GFR: 76.25 mL/min (ref >60.00)
Glucose, Bld: 130 mg/dL — ABNORMAL HIGH (ref 70–99)
POTASSIUM: 3.3 meq/L — AB (ref 3.5–5.1)
Sodium: 140 mEq/L (ref 135–145)
Total Protein: 7.4 g/dL (ref 6.0–8.3)

## 2016-11-27 LAB — BRAIN NATRIURETIC PEPTIDE: Pro B Natriuretic peptide (BNP): 34 pg/mL (ref 0.0–100.0)

## 2016-11-27 MED ORDER — POTASSIUM CHLORIDE CRYS ER 15 MEQ PO TBCR: 15.0000 meq | EXTENDED_RELEASE_TABLET | Freq: Two times a day (BID) | ORAL | 2 refills | Status: DC

## 2016-11-27 NOTE — Progress Notes (Signed)
Subjective:  Patient ID: Sierra [name], female    DOB: 03/12/1956  Age: 61 y.o. MRN: [id number]  CC: Palpitations  Emergent work-in appt  HPI Sierra [name] presents for Concerns about an elevated heart rate. She was in her usual state of health until about one hour prior to this appointment when she was shopping at [place]. She felt her heart rate suddenly go up and she thinks it was as high as 150 based on her estimation. Her heart rate was rapid but she felt like it was regular with no extra or skipped beats. The elevated heart rate lasted for about an hour and during that time she felt anxious, dizzy, lightheaded, mildly nauseous, slightly short of breath, and near syncopal. She did not experience syncope. She did not experience chest pain or diaphoresis. Sensation of an elevated heart rate has resolved since her arrival here. She was not aware of anything that happened earlier in the day that could've triggered this. She has not recently taken any decongestants, excessive caffeine intake, become dehydrated, or felt anxious or worried. She takes a diuretic for Mnire's disease but doesn't think she has high blood pressure.  Outpatient Medications Prior to Visit  Medication Sig Dispense Refill  . estradiol (ESTRACE) 0.1 MG/GM vaginal cream Place 2 g vaginally daily. Using 2-3 times weekly    . triamterene-hydrochlorothiazide (MAXZIDE-25) 37.5-25 MG per tablet Take 1 tablet by mouth. Every other day     No facility-administered medications prior to visit.     ROS Review of Systems  Constitutional: Negative for chills, diaphoresis, fatigue, fever and unexpected weight change.  HENT: Negative.   Eyes: Negative for visual disturbance.  Respiratory: Positive for shortness of breath. Negative for cough, chest tightness, wheezing and stridor.   Cardiovascular: Positive for palpitations. Negative for chest pain and leg swelling.  Gastrointestinal: Negative.  Negative for abdominal pain,  constipation, diarrhea, nausea and vomiting.  Endocrine: Negative.   Genitourinary: Negative.  Negative for difficulty urinating.  Musculoskeletal: Negative.  Negative for back pain and myalgias.  Skin: Negative for color change and rash.  Allergic/Immunologic: Negative.   Neurological: Positive for dizziness. Negative for syncope, speech difficulty, weakness and light-headedness.  Hematological: Negative for adenopathy. Does not bruise/bleed easily.  Psychiatric/Behavioral: Negative.  Negative for dysphoric mood and sleep disturbance. The patient is not nervous/anxious.     Objective:  BP 140/86 (BP Location: Left Arm, Patient Position: Sitting, Cuff Size: Normal)   Pulse 88   Temp 98 F (36.7 C) (Oral)   Resp 18   BP Readings from Last 3 Encounters:  11/27/16 140/86  08/01/16 110/70  10/05/15 122/78    Wt Readings from Last 3 Encounters:  09/20/16 160 lb 9.6 oz (72.8 kg)  08/01/16 160 lb 11.2 oz (72.9 kg)  12/13/14 156 lb 9.6 oz (71 kg)    Physical Exam  Constitutional: She is oriented to person, place, and time.  Non-toxic appearance. She does not have a sickly appearance. She does not appear ill. No distress.  HENT:  Mouth/Throat: Oropharynx is clear and moist. No oropharyngeal exudate.  Eyes: Conjunctivae are normal. Right eye exhibits no discharge. Left eye exhibits no discharge. No scleral icterus.  Neck: Normal range of motion. Neck supple. No JVD present. No thyromegaly present.  Cardiovascular: Normal rate, regular rhythm, normal heart sounds and intact distal pulses.  Exam reveals no gallop and no friction rub.   No murmur heard. EKG -  Sinus  Rhythm  WITHIN NORMAL LIMITS- no old  EKG for comparison  Pulmonary/Chest: Effort normal and breath sounds normal. No respiratory distress. She has no wheezes. She has no rales. She exhibits no tenderness.  Abdominal: Soft. Bowel sounds are normal. She exhibits no distension and no mass. There is no tenderness. There is no  rebound and no guarding.  Musculoskeletal: Normal range of motion. She exhibits no edema, tenderness or deformity.  Lymphadenopathy:    She has no cervical adenopathy.  Neurological: She is alert and oriented to person, place, and time.  Skin: Skin is warm and dry. No rash noted. She is not diaphoretic. No erythema. No pallor.  Psychiatric: She has a normal mood and affect. Her behavior is normal. Judgment and thought content normal.  Vitals reviewed.   Lab Results  Component Value Date   WBC 4.6 11/27/2016   HGB 14.4 11/27/2016   HCT 43.1 11/27/2016   PLT 252.0 11/27/2016   GLUCOSE 130 (H) 11/27/2016   CHOL 210 (H) 07/26/2016   TRIG 59.0 07/26/2016   HDL 85.50 07/26/2016   LDLDIRECT 112.2 08/22/2012   LDLCALC 112 (H) 07/26/2016   ALT 22 11/27/2016   AST 24 11/27/2016   NA 140 11/27/2016   K 3.3 (L) 11/27/2016   CL 103 11/27/2016   CREATININE 0.81 11/27/2016   BUN 15 11/27/2016   CO2 30 11/27/2016   TSH 1.19 11/27/2016   INR 1.2 (H) 09/21/2010   HGBA1C 5.9 08/22/2012    Mm Digital Screening Bilateral  Result Date: 01/07/2014 CLINICAL DATA:  Screening. EXAM: DIGITAL SCREENING BILATERAL MAMMOGRAM WITH CAD COMPARISON:  Previous exam(s). ACR Breast Density Category c: The breast tissue is heterogeneously dense, which may obscure small masses. FINDINGS: There are no findings suspicious for malignancy. Images were processed with CAD. IMPRESSION: No mammographic evidence of malignancy. A result letter of this screening mammogram will be mailed directly to the patient. RECOMMENDATION: Screening mammogram in one year. (Code:SM-B-01Y) BI-RADS CATEGORY  1: Negative. Electronically Signed   By: Pamelia Hoit M.D.   On: 01/11/2014 10:13    Assessment & Plan:   Lupie was seen today for palpitations.  Diagnoses and all orders for this visit:  SOB (shortness of breath)- her EKG is normal now, screening for PE and CHF is negative, her labs are only remarkable for mild hypokalemia, I think  this was related to the palpitations and anxiety and has since resolved. -     EKG 12-Lead -     CBC with Differential/Platelet; Future -     D-dimer, quantitative (not at [facility] Future -     Brain natriuretic peptide; Future  Increased heart rate -     EKG 12-Lead  Rapid palpitations- she had a brief episode of rapid, regular palpitations and had some associated symptoms but this has resolved and at this time I think she experienced some form of tachy dysrhythmia but at this time it sounds like it was benign and she is stable. Her labs are only remarkable for hypokalemia which may have contributed to this. Will treat the hypokalemia. The rest of her labs are negative for any secondary or metabolic causes. I've asked her to undergo an event monitor to try to identify whether or not she has had some form of tachy dysrhythmia such as SVT or atrial fibrillation. She describes an elevated heart rate but a regular rhythm so at this time I'm not highly suspicious for atrial fibrillation and will therefore not anticoagulate. -     Comprehensive metabolic panel; Future -  Thyroid Panel With TSH; Future  Diuretic-induced hypokalemia -     potassium chloride SA (KLOR-CON M15) 15 MEQ tablet; Take 1 tablet (15 mEq total) by mouth 2 (two) times daily.   I am having Ms. Wickliffe start on potassium chloride SA. I am also having her maintain her triamterene-hydrochlorothiazide and estradiol.  Meds ordered this encounter  Medications  . potassium chloride SA (KLOR-CON M15) 15 MEQ tablet    Sig: Take 1 tablet (15 mEq total) by mouth 2 (two) times daily.    Dispense:  60 tablet    Refill:  2     Follow-up: Return in about 3 weeks (around 12/18/2016).  Scarlette Calico, MD

## 2016-11-27 NOTE — Patient Instructions (Signed)
Palpitations A palpitation is the feeling that your heartbeat is irregular or is faster than normal. It may feel like your heart is fluttering or skipping a beat. Palpitations are usually not a serious problem. They may be caused by many things, including smoking, caffeine, alcohol, stress, and certain medicines. Although most causes of palpitations are not serious, palpitations can be a sign of a serious medical problem. In some cases, you may need further medical evaluation. Follow these instructions at home: Pay attention to any changes in your symptoms. Take these actions to help with your condition:  Avoid the following: ? Caffeinated coffee, tea, soft drinks, diet pills, and energy drinks. ? Chocolate. ? Alcohol.  Do not use any tobacco products, such as cigarettes, chewing tobacco, and e-cigarettes. If you need help quitting, ask your health care provider.  Try to reduce your stress and anxiety. Things that can help you relax include: ? Yoga. ? Meditation. ? Physical activity, such as swimming, jogging, or walking. ? Biofeedback. This is a method that helps you learn to use your mind to control things in your body, such as your heartbeats.  Get plenty of rest and sleep.  Take over-the-counter and prescription medicines only as told by your health care provider.  Keep all follow-up visits as told by your health care provider. This is important.  Contact a health care provider if:  You continue to have a fast or irregular heartbeat after 24 hours.  Your palpitations occur more often. Get help right away if:  You have chest pain or shortness of breath.  You have a severe headache.  You feel dizzy or you faint. This information is not intended to replace advice given to you by your health care provider. Make sure you discuss any questions you have with your health care provider. Document Released: 05/11/2000 Document Revised: 10/17/2015 Document Reviewed: 01/27/2015 Elsevier  Interactive Patient Education  2017 [employer]  

## 2016-11-28 ENCOUNTER — Encounter: Payer: Self-pay | Admitting: Internal Medicine

## 2016-11-28 LAB — THYROID PANEL WITH TSH
FREE THYROXINE INDEX: 2.4 (ref 1.4–3.8)
T3 UPTAKE: 27 % (ref 22–35)
T4, Total: 8.8 ug/dL (ref 4.5–12.0)
TSH: 1.19 m[IU]/L

## 2016-11-28 LAB — D-DIMER, QUANTITATIVE: D-Dimer, Quant: 0.37 mcg/mL FEU (ref <0.50)

## 2016-12-14 NOTE — Addendum Note (Signed)
Addended by: Aviva Signs M on: 12/14/2016 12:16 PM   Modules accepted: Orders

## 2016-12-21 NOTE — Telephone Encounter (Signed)
-----   Message from Octavio Manns sent at 12/19/2016  4:49 PM EDT ----- It looks like they left a msg for pt to call back on 7/23 ----- Message ----- From: Aviva Signs, [place]: 12/19/2016   3:38 PM To: Octavio Manns  Can you take a look at the holter monitor order for this patient.

## 2017-01-22 ENCOUNTER — Ambulatory Visit (INDEPENDENT_AMBULATORY_CARE_PROVIDER_SITE_OTHER): Payer: 59

## 2017-01-22 DIAGNOSIS — R000 Tachycardia, unspecified: Secondary | ICD-10-CM | POA: Diagnosis not present

## 2017-01-22 DIAGNOSIS — R002 Palpitations: Secondary | ICD-10-CM | POA: Diagnosis not present

## 2017-01-22 DIAGNOSIS — R0602 Shortness of breath: Secondary | ICD-10-CM

## 2017-01-23 DIAGNOSIS — R002 Palpitations: Secondary | ICD-10-CM | POA: Diagnosis not present

## 2017-02-14 ENCOUNTER — Encounter: Payer: Self-pay | Admitting: Internal Medicine

## 2017-02-14 ENCOUNTER — Ambulatory Visit (INDEPENDENT_AMBULATORY_CARE_PROVIDER_SITE_OTHER): Payer: 59 | Admitting: Internal Medicine

## 2017-02-14 DIAGNOSIS — M79642 Pain in left hand: Secondary | ICD-10-CM | POA: Diagnosis not present

## 2017-02-14 NOTE — Progress Notes (Signed)
   Subjective:    Patient ID: Sierra [name], female    DOB: 1956-02-27, 61 y.o.   MRN: [id number]  HPI The patient is a 61 YO female coming in for spider bite. Happened while walking this morning about 9AM. She is not sure of what kind of spider. Bite hurt intensely and she flung the spider away but could have been brown. She is concerned that it is brown recluse spider as she has seen someone who got bit with one who had fingers lost. She is having mild pain now in the area and slight redness around the bite. She feels it has swelled slightly and you cannot see bite marks still. No fevers or chills. Denies SOB or nausea.   Review of Systems  Constitutional: Negative.   Respiratory: Negative.   Cardiovascular: Negative.   Gastrointestinal: Negative.   Musculoskeletal: Positive for joint swelling and myalgias.  Skin: Positive for rash and wound.  Neurological: Negative.       Objective:   Physical Exam  Constitutional: She is oriented to person, place, and time. She appears well-developed and well-nourished.  HENT:  Head: Normocephalic and atraumatic.  Eyes: EOM are normal.  Cardiovascular: Normal rate and regular rhythm.   Pulmonary/Chest: Effort normal.  Abdominal: Soft.  Neurological: She is alert and oriented to person, place, and time.  Skin: Skin is warm and dry.  Left 5th finger with small red rash on the distal joint, no visible bite mark   Vitals:   02/14/17 1042  BP: 108/78  Pulse: 78  Temp: 98 F (36.7 C)  TempSrc: Oral  SpO2: 98%  Height: 5\' 4"  (1.626 m)      Assessment & Plan:

## 2017-02-14 NOTE — Assessment & Plan Note (Signed)
Due to spider bite. Likely not brown recluse as they are not typically outdoors. Bite does not appear to by necrosing. Advised to monitor closely over the next day or so for changes. If any nausea/vomiting or fevers/chills needs to seek care in ER for lab evaluation. Tdap up to date. Return precautions discussed.

## 2017-02-14 NOTE — Patient Instructions (Signed)
Keep a watch of the area and if it worsens call us.  For fevers, chills, vomiting, nausea seek care in the ER.

## 2017-02-15 ENCOUNTER — Ambulatory Visit (INDEPENDENT_AMBULATORY_CARE_PROVIDER_SITE_OTHER): Payer: 59 | Admitting: Internal Medicine

## 2017-02-15 ENCOUNTER — Emergency Department (HOSPITAL_COMMUNITY): Admission: EM | Admit: 2017-02-15 | Discharge: 2017-02-15 | Discharge status: Against Medical Advice | Payer: 59

## 2017-02-15 ENCOUNTER — Encounter: Payer: Self-pay | Admitting: Internal Medicine

## 2017-02-15 DIAGNOSIS — T63301D Toxic effect of unspecified spider venom, accidental (unintentional), subsequent encounter: Secondary | ICD-10-CM | POA: Diagnosis not present

## 2017-02-15 DIAGNOSIS — T63301A Toxic effect of unspecified spider venom, accidental (unintentional), initial encounter: Secondary | ICD-10-CM | POA: Insufficient documentation

## 2017-02-15 MED ORDER — TRIAMCINOLONE ACETONIDE 0.5 % EX CREA: 1.0000 "application " | TOPICAL_CREAM | Freq: Three times a day (TID) | CUTANEOUS | 0 refills | Status: DC

## 2017-02-15 MED ORDER — METHYLPREDNISOLONE 4 MG PO TBPK: ORAL_TABLET | ORAL | 0 refills | Status: DC

## 2017-02-15 MED ORDER — DOXYCYCLINE HYCLATE 100 MG PO TABS: 100.0000 mg | ORAL_TABLET | Freq: Two times a day (BID) | ORAL | 0 refills | Status: DC

## 2017-02-15 MED ORDER — PSEUDOEPHEDRINE HCL ER 120 MG PO TB12: 120.0000 mg | ORAL_TABLET | Freq: Two times a day (BID) | ORAL | 1 refills | Status: DC | PRN

## 2017-02-15 MED ORDER — DIPHENHYDRAMINE HCL 25 MG PO CAPS: 25.0000 mg | ORAL_CAPSULE | Freq: Four times a day (QID) | ORAL | 0 refills | Status: DC | PRN

## 2017-02-15 NOTE — Patient Instructions (Signed)
Cool compress

## 2017-02-15 NOTE — Progress Notes (Signed)
Subjective:  Patient ID: Sierra [name], female    DOB: 1955/06/25  Age: 61 y.o. MRN: [id number]  CC: No chief complaint on file.   HPI Sierra [name] presents for L hand spider bite yesterday morning on a walk in the woods. The spider was brown. There was initial pain, then itching. The swelling has gotten worse today. No fever or chills. No n/v/d/HA/SOB...  Outpatient Medications Prior to Visit  Medication Sig Dispense Refill  . estradiol (ESTRACE) 0.1 MG/GM vaginal cream Place 2 g vaginally daily. Using 2-3 times weekly    . potassium chloride SA (KLOR-CON M15) 15 MEQ tablet Take 1 tablet (15 mEq total) by mouth 2 (two) times daily. 60 tablet 2  . triamterene-hydrochlorothiazide (MAXZIDE-25) 37.5-25 MG per tablet Take 1 tablet by mouth. Every other day     No facility-administered medications prior to visit.     ROS Review of Systems  Constitutional: Negative for activity change, appetite change, chills, fatigue, fever and unexpected weight change.  HENT: Negative for congestion, mouth sores and sinus pressure.   Eyes: Negative for visual disturbance.  Respiratory: Negative for cough and chest tightness.   Gastrointestinal: Negative for abdominal pain and nausea.  Genitourinary: Negative for difficulty urinating, frequency and vaginal pain.  Musculoskeletal: Negative for arthralgias, back pain and gait problem.  Skin: Positive for color change and rash. Negative for pallor and wound.  Neurological: Negative for dizziness, tremors, weakness, numbness and headaches.  Psychiatric/Behavioral: Negative for confusion and sleep disturbance.    Objective:  BP 128/84 (BP Location: Left Arm, Patient Position: Sitting, Cuff Size: Normal)   Pulse 74   Temp 98.4 F (36.9 C) (Oral)   SpO2 99%   BP Readings from Last 3 Encounters:  02/15/17 128/84  02/14/17 108/78  11/27/16 140/86    Wt Readings from Last 3 Encounters:  09/20/16 160 lb 9.6 oz (72.8 kg)  08/01/16 160 lb 11.2  oz (72.9 kg)  12/13/14 156 lb 9.6 oz (71 kg)    Physical Exam  Constitutional: She appears well-nourished. No distress.  Musculoskeletal: She exhibits edema.  Skin: Skin is warm. Rash noted. There is erythema.  Psychiatric: She has a normal mood and affect. Her behavior is normal. Judgment and thought content normal.  Non-toxic appearing L lateral hand over MCP #3-4-5 with pink soft swelling mostly dorsal and lateral (from the knuckles to the proximal hand; tiny vesicles over hypothenar area. It is sensitive to palpation. Rings are loose.    Lab Results  Component Value Date   WBC 4.6 11/27/2016   HGB 14.4 11/27/2016   HCT 43.1 11/27/2016   PLT 252.0 11/27/2016   GLUCOSE 130 (H) 11/27/2016   CHOL 210 (H) 07/26/2016   TRIG 59.0 07/26/2016   HDL 85.50 07/26/2016   LDLDIRECT 112.2 08/22/2012   LDLCALC 112 (H) 07/26/2016   ALT 22 11/27/2016   AST 24 11/27/2016   NA 140 11/27/2016   K 3.3 (L) 11/27/2016   CL 103 11/27/2016   CREATININE 0.81 11/27/2016   BUN 15 11/27/2016   CO2 30 11/27/2016   TSH 1.19 11/27/2016   INR 1.2 (H) 09/21/2010   HGBA1C 5.9 08/22/2012    Mm Digital Screening Bilateral  Result Date: 01/07/2014 CLINICAL DATA:  Screening. EXAM: DIGITAL SCREENING BILATERAL MAMMOGRAM WITH CAD COMPARISON:  Previous exam(s). ACR Breast Density Category c: The breast tissue is heterogeneously dense, which may obscure small masses. FINDINGS: There are no findings suspicious for malignancy. Images were processed with CAD. IMPRESSION: No  mammographic evidence of malignancy. A result letter of this screening mammogram will be mailed directly to the patient. RECOMMENDATION: Screening mammogram in one year. (Code:SM-B-01Y) BI-RADS CATEGORY  1: Negative. Electronically Signed   By: Pamelia Hoit M.D.   On: 01/11/2014 10:13    Assessment & Plan:   There are no diagnoses linked to this encounter. I am having Ms. Ballman maintain her triamterene-hydrochlorothiazide, estradiol, and  potassium chloride SA.  No orders of the defined types were placed in this encounter.    Follow-up: No Follow-up on file.  Walker Kehr, MD

## 2017-02-15 NOTE — Assessment & Plan Note (Addendum)
Discussed Rx- Now: Sudafed, Benadryl, Dosepack, Triamcinolone cream If worse: start Doxy po; go to ER Elevate hand Cool compress

## 2017-03-07 ENCOUNTER — Encounter: Payer: Self-pay | Admitting: Internal Medicine

## 2017-03-23 DIAGNOSIS — Z23 Encounter for immunization: Secondary | ICD-10-CM | POA: Diagnosis not present

## 2017-04-11 DIAGNOSIS — H8102 Meniere's disease, left ear: Secondary | ICD-10-CM | POA: Diagnosis not present

## 2017-04-11 DIAGNOSIS — H903 Sensorineural hearing loss, bilateral: Secondary | ICD-10-CM | POA: Diagnosis not present

## 2017-08-27 DIAGNOSIS — Z01419 Encounter for gynecological examination (general) (routine) without abnormal findings: Secondary | ICD-10-CM | POA: Diagnosis not present

## 2017-08-27 DIAGNOSIS — Z124 Encounter for screening for malignant neoplasm of cervix: Secondary | ICD-10-CM | POA: Diagnosis not present

## 2017-08-27 DIAGNOSIS — Z1231 Encounter for screening mammogram for malignant neoplasm of breast: Secondary | ICD-10-CM | POA: Diagnosis not present

## 2017-10-03 DIAGNOSIS — L814 Other melanin hyperpigmentation: Secondary | ICD-10-CM | POA: Diagnosis not present

## 2017-10-03 DIAGNOSIS — D1801 Hemangioma of skin and subcutaneous tissue: Secondary | ICD-10-CM | POA: Diagnosis not present

## 2017-10-03 DIAGNOSIS — Z85828 Personal history of other malignant neoplasm of skin: Secondary | ICD-10-CM | POA: Diagnosis not present

## 2017-10-03 DIAGNOSIS — B078 Other viral warts: Secondary | ICD-10-CM | POA: Diagnosis not present

## 2017-10-23 ENCOUNTER — Encounter: Payer: Self-pay | Admitting: Internal Medicine

## 2017-10-23 ENCOUNTER — Ambulatory Visit (INDEPENDENT_AMBULATORY_CARE_PROVIDER_SITE_OTHER): Payer: 59 | Admitting: Internal Medicine

## 2017-10-23 VITALS — BP 118/78 | HR 73 | Temp 98.4°F

## 2017-10-23 DIAGNOSIS — Q321 Other congenital malformations of trachea: Secondary | ICD-10-CM

## 2017-10-23 DIAGNOSIS — R05 Cough: Secondary | ICD-10-CM

## 2017-10-23 DIAGNOSIS — R0989 Other specified symptoms and signs involving the circulatory and respiratory systems: Secondary | ICD-10-CM

## 2017-10-23 DIAGNOSIS — R053 Chronic cough: Secondary | ICD-10-CM

## 2017-10-23 NOTE — Progress Notes (Signed)
Chief Complaint  Patient presents with  . Cough    Pt c/o cough and congestion x 2 days. Runny nose. Pt states that a few weeks ago she had the same symptoms and it went away but symptoms have not returned. Pt taking Nyquil and Umcka herbal     HPI: Sierra [name] 62 y.o. come in for sda     Days of runny nose  pnd but no fever pain but prev just resolving  Cough laryngitis  Illness.   Weeks ago had bad coughhing illness    And better  And then  2 days  Of sx .   Yesterday  Voice.    Not sure if better or not.  No fever now.  Feel in chest .   And coughing.      Better a week.  Has been using  Lemon menthol cough drops  29 mos old  Granddaughter who is in daycare   Will baby sit   ROS: See pertinent positives and negatives per HPI.  Past Medical History:  Diagnosis Date  . History of Doppler ultrasound    leg negative  2010 right  . History of Doppler ultrasound    right leg 2010 neg  . Meniere's disease    on diuretic therapy  . Normal nuclear stress test    Myoview 2006   . Shortness of breath    negative echo and stress echo 11/11 except for grade 2 diastolic dysfunction. EF is 60%. Normal CPX 11/11  . 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 Uterine cancer Mother        sister  . Asthma Father   . Hyperlipidemia Father   . Macular degeneration Father   . Allergies Father   . Prostate cancer Father   . Multiple sclerosis Sister   . Uterine cancer Sister   . Thyroid cancer Daughter     Social History   Socioeconomic History  . Marital status: Married    Spouse name: Not on file  . Number of children: Not on file  . Years of education: Not on file  . Highest education level: Not on file  Occupational History  . Not on file  Social Needs  . Financial resource strain: Not on file  . Food insecurity:    Worry: Not on file    Inability: Not on file  .  Transportation needs:    Medical: Not on file    Non-medical: Not on file  Tobacco Use  . Smoking status: Never Smoker  . Smokeless tobacco: Never Used  Substance and Sexual Activity  . Alcohol use: Yes    Alcohol/week: 0.0 oz    Comment: socially 1-2 a month  . Drug use: No  . Sexual activity: Yes  Lifestyle  . Physical activity:    Days per week: Not on file    Minutes per session: Not on file  . Stress: Not on file  Relationships  . Social connections:    Talks on phone: Not on file    Gets together: Not on file    Attends religious service: Not on file    Active member of club or organization: Not on file    Attends meetings of clubs or organizations: Not on file    Relationship status: Not on file  Other Topics Concern  . Not on file  Social History Narrative  Regular Exercise- Yes   Occupation: Lawyer   HH of 2   3 Children    Outpatient Medications Prior to Visit  Medication Sig Dispense Refill  . estradiol (ESTRACE) 0.1 MG/GM vaginal cream Place 2 g vaginally daily. Using 2-3 times weekly    . potassium chloride SA (KLOR-CON M15) 15 MEQ tablet Take 1 tablet (15 mEq total) by mouth 2 (two) times daily. 60 tablet 2  . triamterene-hydrochlorothiazide (MAXZIDE-25) 37.5-25 MG per tablet Take 1 tablet by mouth. Every other day    . diphenhydrAMINE (BENADRYL) 25 mg capsule Take 1-2 capsules (25-50 mg total) by mouth every 6 (six) hours as needed. (Patient not taking: Reported on 10/23/2017) 30 capsule 0  . doxycycline (VIBRA-TABS) 100 MG tablet Take 1 tablet (100 mg total) by mouth 2 (two) times daily. (Patient not taking: Reported on 10/23/2017) 20 tablet 0  . methylPREDNISolone (MEDROL DOSEPAK) 4 MG TBPK tablet As directed (Patient not taking: Reported on 10/23/2017) 21 tablet 0  . pseudoephedrine (SUDAFED) 120 MG 12 hr tablet Take 1 tablet (120 mg total) by mouth 2 (two) times daily as needed for congestion. (Patient not taking: Reported on 10/23/2017) 60 tablet 1  .  triamcinolone cream (KENALOG) 0.5 % Apply 1 application topically 3 (three) times daily. (Patient not taking: Reported on 10/23/2017) 60 g 0   No facility-administered medications prior to visit.      EXAM:  BP 118/78 (BP Location: Right Arm, Patient Position: Sitting, Cuff Size: Normal)   Pulse 73   Temp 98.4 F (36.9 C) (Oral)   There is no height or weight on file to calculate BMI.  GENERAL: vitals reviewed and listed above, alert, oriented, appears well hydrated and in no acute distress   No stridor mild throat clearing  And cough  HEENT: atraumatic, conjunctiva  clear, no obvious abnormalities on inspection of external nose and earstm clear  Mild  sinus congestions  OP : no lesion edema or exudate  NECK: no obvious masses on inspection palpation  LUNGS: clear to auscultation bilaterally, no wheezes, rales or rhonchi, good air movement CV: HRRR, no clubbing cyanosis or  peripheral edema nl cap refill  MS: moves all extremities without noticeable focal  abnormality PSYCH: pleasant and cooperative, no obvious depression or anxiety  BP Readings from Last 3 Encounters:  10/23/17 118/78  02/15/17 128/84  02/14/17 108/78   revewied past x ray and imaging studies  ASSESSMENT AND PLAN:  Discussed the following assessment and plan:  Cough, persistent - second illness vs  relapsing sx  no evidence bacterial infection at this time  has  upper airway    benign abnornalites 2014 reviweed w pat - Plan: DG Chest 2 View  Upper respiratory symptom  Tracheal anomaly - tracheobronchopathica  osteochondroplastica  Hx of upper airway plaques and benign process   Expect a second hit illness   Follow closely  Low threshold to get x ray based on past hx but  Exam is reassuring today   tracheobronchopathica  osteochondroplastica  -Patient advised to return or notify health care team  if  new concerns arise.  Patient Instructions  Exam is reassuring today   Chest is clear    Add flonase  Nasal  spray   For a week  And   Try plain mucinex    to loosen secretiuons  \\  Can consider getting chest x ray to r/o surprises .     Try sugar free  Candy  Instead of menthol and  Any oil based cough drops.       Standley Brooking. Kashmir Lysaght M.D.

## 2017-10-23 NOTE — Patient Instructions (Addendum)
Exam is reassuring today   Chest is clear    Add flonase  Nasal spray   For a week  And   Try plain mucinex    to loosen secretiuons  \\  Can consider getting chest x ray to r/o surprises .     Try sugar free  Candy  Instead of menthol and  Any oil based cough drops.

## 2017-10-30 ENCOUNTER — Ambulatory Visit: Payer: Self-pay | Admitting: *Deleted

## 2017-10-30 MED ORDER — AZITHROMYCIN 250 MG PO TABS: ORAL_TABLET | ORAL | 0 refills | Status: DC

## 2017-10-30 NOTE — Telephone Encounter (Signed)
Pt aware that Zpak being sent to [facility].   Will send to Dr Regis Bill as Juluis Rainier.

## 2017-10-30 NOTE — Telephone Encounter (Signed)
  Please advise Dr Sarajane Jews if able to send in abx for patient. Dr Regis Bill mentioned in last OV 10/23/17 that she may need a CXR to rule out anything further processes.  Allergies  Allergen Reactions  . Morphine   . Tributyl Phosphate Hives  . Latex Rash   Last OV 10/23/17 for cough Instructions   Exam is reassuring today   Chest is clear    Add flonase  Nasal spray   For a week  And   Try plain mucinex    to loosen secretiuons   Can consider getting chest x ray to r/o surprises .     Try sugar free  Candy  Instead of menthol and  Any oil based cough drops.

## 2017-10-30 NOTE — Telephone Encounter (Signed)
Call in a [place] for her

## 2017-10-30 NOTE — Addendum Note (Signed)
Addended by: Virl Cagey on: 10/30/2017 11:45 AM   Modules accepted: Orders

## 2017-10-30 NOTE — Telephone Encounter (Signed)
Pt called with not getting any better after seeing he pcp (05/29) last week. She has a productive cough that is now yellow and thick. She is going out of town in the morning and would like to a call back if she needs to get an antibiotic. She denies fever, wheezing or chest pain. Will route to flow at [facility] regarding pt request.  Reason for Disposition . Cough has been present for > 3 weeks  Answer Assessment - Initial Assessment Questions 1. ONSET: "When did the cough begin?"      A few weeks 2. SEVERITY: "How bad is the cough today?"      Cough more productive with thicker and yellow sputum 3. RESPIRATORY DISTRESS: "Describe your breathing."      no 4. FEVER: "Do you have a fever?" If so, ask: "What is your temperature, how was it measured, and when did it start?"     no 5. SPUTUM: "Describe the color of your sputum" (clear, white, yellow, green)     yellow 6. HEMOPTYSIS: "Are you coughing up any blood?" If so ask: "How much?" (flecks, streaks, tablespoons, etc.)     no 7. CARDIAC HISTORY: "Do you have any history of heart disease?" (e.g., heart attack, congestive heart failure)      no 8. LUNG HISTORY: "Do you have any history of lung disease?"  (e.g., pulmonary embolus, asthma, emphysema)     no 9. PE RISK FACTORS: "Do you have a history of blood clots?" (or: recent major surgery, recent prolonged travel, bedridden )     no 10. OTHER SYMPTOMS: "Do you have any other symptoms?" (e.g., runny nose, wheezing, chest pain)       Runny nose 11. PREGNANCY: "Is there any chance you are pregnant?" "When was your last menstrual period?"       no 12. TRAVEL: "Have you traveled out of the country in the last month?" (e.g., travel history, exposures)       no  Protocols used: [place]

## 2017-11-15 DIAGNOSIS — M2011 Hallux valgus (acquired), right foot: Secondary | ICD-10-CM | POA: Diagnosis not present

## 2017-11-15 DIAGNOSIS — M79672 Pain in left foot: Secondary | ICD-10-CM | POA: Diagnosis not present

## 2017-11-15 DIAGNOSIS — M79671 Pain in right foot: Secondary | ICD-10-CM | POA: Diagnosis not present

## 2017-11-20 ENCOUNTER — Other Ambulatory Visit: Payer: Self-pay | Admitting: Internal Medicine

## 2017-11-20 DIAGNOSIS — E876 Hypokalemia: Secondary | ICD-10-CM

## 2017-11-20 DIAGNOSIS — T502X5A Adverse effect of carbonic-anhydrase inhibitors, benzothiadiazides and other diuretics, initial encounter: Principal | ICD-10-CM

## 2017-11-21 NOTE — Telephone Encounter (Signed)
Last potassium check was almost  a year ago   Please order a   CMP   Dx: hypokalemia    Site of her choice [facility]  .  Can do one refill  In the interim

## 2017-11-26 ENCOUNTER — Telehealth: Payer: Self-pay | Admitting: *Deleted

## 2017-11-26 DIAGNOSIS — E876 Hypokalemia: Secondary | ICD-10-CM

## 2017-11-26 DIAGNOSIS — T502X5A Adverse effect of carbonic-anhydrase inhibitors, benzothiadiazides and other diuretics, initial encounter: Secondary | ICD-10-CM

## 2017-11-26 NOTE — Telephone Encounter (Signed)
Lab ordered Pt appt scheduled for 11/27/17 Refill sent to pharmacy Nothing further needed.

## 2017-11-26 NOTE — Telephone Encounter (Signed)
I left a voice message for pt to return my call.  

## 2017-11-26 NOTE — Telephone Encounter (Signed)
Spoke with the patient, states that she was advised Sierra [name] to return call to schedule lab visit for blood work in order to get mediations called in.    Panosh, Standley Brooking, MD Note  Last potassium check was almost  a year ago   Please order a   CMP   Dx: hypokalemia    Site of her choice [facility]  .  Can do one refill  In the interim        Lab ordered Pt appt scheduled for 11/27/17 Refill sent to pharmacy Nothing further needed.

## 2017-11-26 NOTE — Telephone Encounter (Signed)
Copied from [place] [id number]. Topic: Quick Communication - Office Called Patient >> Nov 26, 2017  4:44 PM Cecelia Byars, NT wrote: Reason for CRM: Patient returned call from office please call back

## 2017-11-27 ENCOUNTER — Other Ambulatory Visit (INDEPENDENT_AMBULATORY_CARE_PROVIDER_SITE_OTHER): Payer: 59

## 2017-11-27 DIAGNOSIS — E876 Hypokalemia: Secondary | ICD-10-CM

## 2017-11-27 LAB — COMPREHENSIVE METABOLIC PANEL
ALBUMIN: 4 g/dL (ref 3.5–5.2)
ALT: 21 U/L (ref 0–35)
AST: 22 U/L (ref 0–37)
Alkaline Phosphatase: 60 U/L (ref 39–117)
BUN: 16 mg/dL (ref 6–23)
CHLORIDE: 105 meq/L (ref 96–112)
CO2: 27 mEq/L (ref 19–32)
Calcium: 9.6 mg/dL (ref 8.4–10.5)
Creatinine, Ser: 0.59 mg/dL (ref 0.40–1.20)
GFR: 109.57 mL/min (ref >60.00)
Glucose, Bld: 161 mg/dL — ABNORMAL HIGH (ref 70–99)
POTASSIUM: 4.2 meq/L (ref 3.5–5.1)
SODIUM: 141 meq/L (ref 135–145)
Total Bilirubin: 0.4 mg/dL (ref 0.2–1.2)
Total Protein: 6.4 g/dL (ref 6.0–8.3)

## 2017-11-29 ENCOUNTER — Other Ambulatory Visit: Payer: Self-pay | Admitting: *Deleted

## 2017-11-29 DIAGNOSIS — R739 Hyperglycemia, unspecified: Secondary | ICD-10-CM

## 2017-12-03 ENCOUNTER — Other Ambulatory Visit (INDEPENDENT_AMBULATORY_CARE_PROVIDER_SITE_OTHER): Payer: 59

## 2017-12-03 DIAGNOSIS — R739 Hyperglycemia, unspecified: Secondary | ICD-10-CM | POA: Diagnosis not present

## 2017-12-03 LAB — HEMOGLOBIN A1C: HEMOGLOBIN A1C: 6 % (ref 4.6–6.5)

## 2017-12-03 LAB — GLUCOSE, RANDOM: Glucose, Bld: 88 mg/dL (ref 70–99)

## 2017-12-17 DIAGNOSIS — S83282A Other tear of lateral meniscus, current injury, left knee, initial encounter: Secondary | ICD-10-CM | POA: Diagnosis not present

## 2017-12-17 DIAGNOSIS — M7662 Achilles tendinitis, left leg: Secondary | ICD-10-CM | POA: Diagnosis not present

## 2017-12-24 ENCOUNTER — Other Ambulatory Visit: Payer: Self-pay | Admitting: Internal Medicine

## 2017-12-24 DIAGNOSIS — E876 Hypokalemia: Secondary | ICD-10-CM

## 2017-12-24 DIAGNOSIS — T502X5A Adverse effect of carbonic-anhydrase inhibitors, benzothiadiazides and other diuretics, initial encounter: Principal | ICD-10-CM

## 2017-12-25 ENCOUNTER — Encounter

## 2017-12-25 ENCOUNTER — Ambulatory Visit: Payer: 59 | Admitting: Family Medicine

## 2017-12-27 DIAGNOSIS — M7741 Metatarsalgia, right foot: Secondary | ICD-10-CM | POA: Diagnosis not present

## 2017-12-27 DIAGNOSIS — M21611 Bunion of right foot: Secondary | ICD-10-CM | POA: Diagnosis not present

## 2018-01-09 DIAGNOSIS — S83282D Other tear of lateral meniscus, current injury, left knee, subsequent encounter: Secondary | ICD-10-CM | POA: Diagnosis not present

## 2018-01-10 DIAGNOSIS — M25562 Pain in left knee: Secondary | ICD-10-CM | POA: Diagnosis not present

## 2018-03-04 ENCOUNTER — Other Ambulatory Visit: Payer: Self-pay | Admitting: Internal Medicine

## 2018-03-04 DIAGNOSIS — T502X5A Adverse effect of carbonic-anhydrase inhibitors, benzothiadiazides and other diuretics, initial encounter: Principal | ICD-10-CM

## 2018-03-04 DIAGNOSIS — E876 Hypokalemia: Secondary | ICD-10-CM

## 2018-03-13 DIAGNOSIS — H8102 Meniere's disease, left ear: Secondary | ICD-10-CM | POA: Diagnosis not present

## 2018-03-13 DIAGNOSIS — H903 Sensorineural hearing loss, bilateral: Secondary | ICD-10-CM | POA: Diagnosis not present

## 2018-03-13 DIAGNOSIS — H0012 Chalazion right lower eyelid: Secondary | ICD-10-CM | POA: Diagnosis not present

## 2018-06-05 ENCOUNTER — Encounter: Payer: Self-pay | Admitting: Internal Medicine

## 2018-06-05 ENCOUNTER — Ambulatory Visit: Payer: Self-pay

## 2018-06-05 ENCOUNTER — Ambulatory Visit (INDEPENDENT_AMBULATORY_CARE_PROVIDER_SITE_OTHER): Payer: 59 | Admitting: Internal Medicine

## 2018-06-05 VITALS — BP 124/66 | HR 87 | Temp 97.9°F | Wt 155.0 lb

## 2018-06-05 DIAGNOSIS — R42 Dizziness and giddiness: Secondary | ICD-10-CM | POA: Diagnosis not present

## 2018-06-05 DIAGNOSIS — H8109 Meniere's disease, unspecified ear: Secondary | ICD-10-CM | POA: Diagnosis not present

## 2018-06-05 NOTE — Telephone Encounter (Signed)
Pt called stating that yesterday she had a migraine HA and developed vertigo along with it.  She states the HA went away with treatment, but the vertigo remained.  She describes it as the room spinning and it worsens with change in head position. She is able to walk. She gives a Hx of Minieres Disease. She states her specialist has recently taken her off the medication she was taking. Appointment scheduled per protocol. Care advice read to patient.  Pt verbalized understanding.  Reason for Disposition . [1] MODERATE dizziness (e.g., vertigo; feels very unsteady, interferes with normal activities) AND [2] has NOT been evaluated by physician for this  Answer Assessment - Initial Assessment Questions 1. DESCRIPTION: "Describe your dizziness."     Room moving 2. VERTIGO: "Do you feel like either you or the room is spinning or tilting?"      spinning 3. LIGHTHEADED: "Do you feel lightheaded?" (e.g., somewhat faint, woozy, weak upon standing)     woozy 4. SEVERITY: "How bad is it?"  "Can you walk?"   - MILD - Feels unsteady but walking normally.   - MODERATE - Feels very unsteady when walking, but not falling; interferes with normal activities (e.g., school, work) .   - SEVERE - Unable to walk without falling (requires assistance).     moderate 5. ONSET:  "When did the dizziness begin?"     yesterday 6. AGGRAVATING FACTORS: "Does anything make it worse?" (e.g., standing, change in head position)     Changing position 7. CAUSE: "What do you think is causing the dizziness?"     headache 8. RECURRENT SYMPTOM: "Have you had dizziness before?" If so, ask: "When was the last time?" "What happened that time?"     miniers dx 9. OTHER SYMPTOMS: "Do you have any other symptoms?" (e.g., headache, weakness, numbness, vomiting, earache)     Migraine HA yesterday 10. PREGNANCY: "Is there any chance you are pregnant?" "When was your last menstrual period?"       N/A  Protocols used: DIZZINESS -  VERTIGO-A-AH

## 2018-06-05 NOTE — Patient Instructions (Addendum)
Get back on the diuretic .  Daily  'this sounds like a migraine and  Your  Vertigo . Your exam is good today .    I ncluding heart No evidence of neurologic deficit .   Can try OTC   meclizine which is antivert for comfort but can cause drowsiness.    Fu ext week if ongoing.   Get your sleep and  keep  hydration.

## 2018-06-05 NOTE — Progress Notes (Signed)
Chief Complaint  Patient presents with  . Dizziness    started yesterday and had a headache that turned into a migraine yesterday. Pt has nausea. Pt states that it feels like the room is spinning around her even sitting, laying down, or standing     HPI: Sierra [name] 63 y.o. come in for sda  vertigo  After an Early am meeting and maybe insidious onset of  headache and then  Noted   Then bending over  Forward got dizzy.   Then flet like dizzier  And after lunch meeting  And head was pounding and nausea .   Like old migraines.    Eased  Up with advil but  Dizziness did not. Resolve  No vomiting   No current headache and stil l swimmy headed  Worse with bending over . No vomiting weakness numbness  Dysarthria   Diplopia  Bleeding   Hx of menieres   In past  And. nore reucrrance of sx for years.    Seen and ? Need to do off.   But has been taking med sporadically  No hearing issues   Dr Thornell Mule.  Seen for menieres  Sleep:   Less recently meeting obligations.   But not that our of ordinary   No fever  Flu shot.  ROS: See pertinent positives and negatives per HPI. No fever cv pulm sx at this time. No tachy or exercise intolerance    Past Medical History:  Diagnosis Date  . History of Doppler ultrasound    leg negative  2010 right  . History of Doppler ultrasound    right leg 2010 neg  . Meniere's disease    on diuretic therapy  . Normal nuclear stress test    Myoview 2006   . Shortness of breath    negative echo and stress echo 11/11 except for grade 2 diastolic dysfunction. EF is 60%. Normal CPX 11/11  . 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 Uterine cancer Mother        sister  . Asthma Father   . Hyperlipidemia Father   . Macular degeneration Father   . Allergies Father   . Prostate cancer Father   . Multiple sclerosis Sister   . Uterine cancer Sister   . Thyroid cancer  Daughter     Social History   Socioeconomic History  . Marital status: Married    Spouse name: Not on file  . Number of children: Not on file  . Years of education: Not on file  . Highest education level: Not on file  Occupational History  . Not on file  Social Needs  . Financial resource strain: Not on file  . Food insecurity:    Worry: Not on file    Inability: Not on file  . Transportation needs:    Medical: Not on file    Non-medical: Not on file  Tobacco Use  . Smoking status: Never Smoker  . Smokeless tobacco: Never Used  Substance and Sexual Activity  . Alcohol use: Yes    Alcohol/week: 0.0 standard drinks    Comment: socially 1-2 a month  . Drug use: No  . Sexual activity: Yes  Lifestyle  . Physical activity:    Days per week: Not on file    Minutes per session: Not on file  . Stress: Not on file  Relationships  . Social connections:  Talks on phone: Not on file    Gets together: Not on file    Attends religious service: Not on file    Active member of club or organization: Not on file    Attends meetings of clubs or organizations: Not on file    Relationship status: Not on file  Other Topics Concern  . 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 estradiol (ESTRACE) 0.1 MG/GM vaginal cream Place 2 g vaginally daily. Using 2-3 times weekly    . KLOR-CON M15 15 MEQ tablet TAKE 1 TABLET (15 MEQ TOTAL) BY MOUTH 2 (TWO) TIMES DAILY. (Patient not taking: Reported on 06/05/2018) 60 tablet 0  . KLOR-CON M15 15 MEQ tablet TAKE 1 TABLET (15 MEQ TOTAL) BY MOUTH 2 (TWO) TIMES DAILY. (Patient not taking: Reported on 06/05/2018) 60 tablet 1  . triamterene-hydrochlorothiazide (MAXZIDE-25) 37.5-25 MG per tablet Take 1 tablet by mouth. Every other day    . azithromycin (ZITHROMAX) 250 MG tablet Take 2 tabs today and then 1 daily until  gone. 6 tablet 0   No facility-administered medications prior to visit.      EXAM:  BP 124/66 (BP Location: Right Arm, Patient Position: Sitting, Cuff Size: Normal)   Pulse 87   Temp 97.9 F (36.6 C) (Oral)   Wt 155 lb (70.3 kg)   BMI 26.61 kg/m   Body mass index is 26.61 kg/m.  GENERAL: vitals reviewed and listed above, alert, oriented, appears well hydrated and in no acute distress HEENT: atraumatic, conjunctiva  clear, no obvious abnormalities on inspection of external nose and ears OP : no lesion edema or exudate  Tongue midline NECK: no obvious masses on inspection palpation  LUNGS: clear to auscultation bilaterally, no wheezes, rales or rhonchi, good air movement CV: HRRR, no clubbing cyanosis or  peripheral edema nl cap refill  MS: moves all extremities without noticeable focal  Abnormality NEURO: oriented x 3 CN 3-12 appear intact. No focal muscle weakness or atrophy. DTRs symmetrical. Gait WNL.  Grossly non focal. No tremor or abnormal movement. Gait nl neg Romberg no tremor  Feels dizzy shen bending over head but eoms nl .  PSYCH: pleasant and cooperative, no obvious depression or anxiety Lab Results  Component Value Date   WBC 4.6 11/27/2016   HGB 14.4 11/27/2016   HCT 43.1 11/27/2016   PLT 252.0 11/27/2016   GLUCOSE 88 12/03/2017   CHOL 210 (H) 07/26/2016   TRIG 59.0 07/26/2016   HDL 85.50 07/26/2016   LDLDIRECT 112.2 08/22/2012   LDLCALC 112 (H) 07/26/2016   ALT 21 11/27/2017   AST 22 11/27/2017   NA 141 11/27/2017   K 4.2 11/27/2017   CL 105 11/27/2017   CREATININE 0.59 11/27/2017   BUN 16 11/27/2017   CO2 27 11/27/2017   TSH 1.19 11/27/2016   INR 1.2 (H) 09/21/2010   HGBA1C 6.0 12/03/2017   BP Readings from Last 3 Encounters:  06/05/18 124/66  10/23/17 118/78  02/15/17 128/84    ASSESSMENT AND PLAN:  Discussed the following assessment and plan:  Vertigo  Meniere's disease, unspecified laterality - has been quiescent  on recentlu on  sproadic diuretic therapy Sounds liek a migraine( resolved)  albeit hasn't had one in a while and  Vertigo hx of meniere  Not that bad  Normal neuro  exam  And cannot say why this happened now   Low threshold for further  evaluation if  persistent or progressive    Expectant management. She will fu with dr Thornell Mule after weekend  If ongoing or other  To restart daily diuretic .  As planned for now.  -Patient advised to return or notify health care team  if  new concerns arise.  Patient Instructions  Get back on the diuretic .  Daily  'this sounds like a migraine and  Your  Vertigo . Your exam is good today .    I ncluding heart No evidence of neurologic deficit .   Can try OTC   meclizine which is antivert for comfort but can cause drowsiness.    Fu ext week if ongoing.   Get your sleep and  keep  hydration.  Standley Brooking. Panosh M.D.

## 2018-06-24 NOTE — Progress Notes (Signed)
Chief Complaint  Patient presents with  . Cough    x 4 days. Pt states she is starting to cough up yellow stuff and is worse at night . pt's daughter is having surgery monday.   Marland Kitchen Headache    Pt states that she talked her ENT told her that the dizziness can be a form of migraine    HPI: Sierra [name] 63 y.o. come in for  above  4 days of cough no fever no  Sig sinus sx   .  Feels that last  Dizziness was a migraine that triggered her meniere's   ( she talked to dr Thornell Mule)    No cp sob  Hemoptysis sinus pain or larger drainage.  Will have to go to [place] for daughters major surgery and doesn't want to be sick or give to others  Some green phelgm  ROS: See pertinent positives and negatives per HPI.  Past Medical History:  Diagnosis Date  . History of Doppler ultrasound    leg negative  2010 right  . History of Doppler ultrasound    right leg 2010 neg  . Meniere's disease    on diuretic therapy  . Normal nuclear stress test    Myoview 2006   . Shortness of breath    negative echo and stress echo 11/11 except for grade 2 diastolic dysfunction. EF is 60%. Normal CPX 11/11  . 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 Uterine cancer Mother        sister  . Asthma Father   . Hyperlipidemia Father   . Macular degeneration Father   . Allergies Father   . Prostate cancer Father   . Multiple sclerosis Sister   . Uterine cancer Sister   . Thyroid cancer Daughter     Social History   Socioeconomic History  . Marital status: Married    Spouse name: Not on file  . Number of children: Not on file  . Years of education: Not on file  . Highest education level: Not on file  Occupational History  . Not on file  Social Needs  . Financial resource strain: Not on file  . Food insecurity:    Worry: Not on file    Inability: Not on file  . Transportation needs:    Medical: Not on file     Non-medical: Not on file  Tobacco Use  . Smoking status: Never Smoker  . Smokeless tobacco: Never Used  Substance and Sexual Activity  . Alcohol use: Yes    Alcohol/week: 0.0 standard drinks    Comment: socially 1-2 a month  . Drug use: No  . Sexual activity: Yes  Lifestyle  . Physical activity:    Days per week: Not on file    Minutes per session: Not on file  . Stress: Not on file  Relationships  . Social connections:    Talks on phone: Not on file    Gets together: Not on file    Attends religious service: Not on file    Active member of club or organization: Not on file    Attends meetings of clubs or organizations: Not on file    Relationship status: Not on file  Other Topics Concern  . 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 estradiol (ESTRACE) 0.1 MG/GM vaginal cream Place 2 g vaginally daily. Using 2-3 times weekly    . triamterene-hydrochlorothiazide (MAXZIDE-25) 37.5-25 MG per tablet Take 1 tablet by mouth. Every other day    . KLOR-CON M15 15 MEQ tablet TAKE 1 TABLET (15 MEQ TOTAL) BY MOUTH 2 (TWO) TIMES DAILY. (Patient not taking: Reported on 06/05/2018) 60 tablet 0  . KLOR-CON M15 15 MEQ tablet TAKE 1 TABLET (15 MEQ TOTAL) BY MOUTH 2 (TWO) TIMES DAILY. (Patient not taking: Reported on 06/05/2018) 60 tablet 1   No facility-administered medications prior to visit.      EXAM:  BP 126/78 (BP Location: Right Arm, Patient Position: Sitting, Cuff Size: Normal)   Pulse 66   Temp 98.7 F (37.1 C) (Oral)   Wt 155 lb (70.3 kg)   SpO2 97%   BMI 26.61 kg/m   Body mass index is 26.61 kg/m. WDWN in NAD  quiet respirations;. Non toxic .looks well.  HEENT: Normocephalic ;atraumatic , Eyes;  PERRL, EOMs  Full, lids and conjunctiva clear,,Ears: no deformities, canals nl, TM landmarks normal, Nose: no deformity or discharge  ;face non  tender Mouth : OP clear without lesion or edema . Neck: Supple without adenopathy or masses or bruits Chest:  Clear to A without wheezes rales or rhonchi CV:  S1-S2 no gallops or murmurs peripheral perfusion is normal Skin :nl perfusion and no acute rashes  Neuro  Non focal   BP Readings from Last 3 Encounters:  06/25/18 126/78  06/05/18 124/66  10/23/17 118/78   ASSESSMENT AND PLAN:  Discussed the following assessment and plan:  Cough prob  viral uncomplicated  some risk  Antibiotic printed in if  Needed   But  Not helpful today  -Patient advised to return or notify health care team  if  new concerns arise.  Patient Instructions  This is most likely viral  Usually last  7- 10 days and evolves as discussed but   If Not getting better or  Fever etc can add antibiotic   Most contagion is viral and not bacterial and thus antibiotic not helpful in preventing transmission.Standley Brooking. Karlisa Gaubert M.D.

## 2018-06-25 ENCOUNTER — Encounter: Payer: Self-pay | Admitting: Internal Medicine

## 2018-06-25 ENCOUNTER — Ambulatory Visit (INDEPENDENT_AMBULATORY_CARE_PROVIDER_SITE_OTHER): Payer: 59 | Admitting: Internal Medicine

## 2018-06-25 VITALS — BP 126/78 | HR 66 | Temp 98.7°F | Wt 155.0 lb

## 2018-06-25 DIAGNOSIS — R05 Cough: Secondary | ICD-10-CM

## 2018-06-25 DIAGNOSIS — R059 Cough, unspecified: Secondary | ICD-10-CM

## 2018-06-25 MED ORDER — AZITHROMYCIN 250 MG PO TABS: ORAL_TABLET | ORAL | 0 refills | Status: DC

## 2018-06-25 NOTE — Patient Instructions (Addendum)
This is most likely viral  Usually last  7- 10 days and evolves as discussed but   If Not getting better or  Fever etc can add antibiotic   Most contagion is viral and not bacterial and thus antibiotic not helpful in preventing transmission.Marland Kitchen

## 2018-07-01 ENCOUNTER — Ambulatory Visit: Payer: Self-pay

## 2018-07-01 NOTE — Telephone Encounter (Signed)
Pt scheduled for 07/03/18

## 2018-07-01 NOTE — Telephone Encounter (Signed)
Pt. Reports she had been going up and down her stairs at home "a lot recently due to renovations" and left foot and ankle became painful and swollen. Believes tendons are involved. Appointment made with Dr. Tamala Julian as requested. Would like to be seen sooner if possible.   Reason for Disposition . [1] Very swollen joint AND [2] no fever  Answer Assessment - Initial Assessment Questions 1. LOCATION: "Which joint is swollen?"     Left ankle 2. ONSET: "When did the swelling start?"     Started - 1 week ago 3. SIZE: "How large is the swelling?"     Can't see ankle bone 4. PAIN: "Is there any pain?" If so, ask: "How bad is it?" (Scale 1-10; or mild, moderate, severe)     In morning - 7     During the day - 4 5. CAUSE: "What do you think caused the swollen joint?"     Tendon 6. OTHER SYMPTOMS: "Do you have any other symptoms?" (e.g., fever, chest pain, difficulty breathing, calf pain)     No 7. PREGNANCY: "Is there any chance you are pregnant?" "When was your last menstrual period?"     n/a  Protocols used: ANKLE SWELLING-A-AH

## 2018-07-03 ENCOUNTER — Ambulatory Visit: Payer: Self-pay

## 2018-07-03 ENCOUNTER — Telehealth: Payer: Self-pay | Admitting: Internal Medicine

## 2018-07-03 ENCOUNTER — Other Ambulatory Visit: Payer: Self-pay

## 2018-07-03 ENCOUNTER — Ambulatory Visit (HOSPITAL_COMMUNITY)
Admission: RE | Admit: 2018-07-03 | Discharge: 2018-07-03 | Disposition: A | Discharge status: Home / Self Care | Payer: 59 | Source: Ambulatory Visit | Attending: Internal Medicine | Admitting: Internal Medicine

## 2018-07-03 ENCOUNTER — Encounter: Payer: Self-pay | Admitting: Family Medicine

## 2018-07-03 ENCOUNTER — Ambulatory Visit (INDEPENDENT_AMBULATORY_CARE_PROVIDER_SITE_OTHER): Payer: 59 | Admitting: Family Medicine

## 2018-07-03 VITALS — BP 106/70 | HR 65 | Ht 64.0 in

## 2018-07-03 DIAGNOSIS — M66872 Spontaneous rupture of other tendons, left ankle and foot: Secondary | ICD-10-CM | POA: Diagnosis not present

## 2018-07-03 DIAGNOSIS — M79606 Pain in leg, unspecified: Secondary | ICD-10-CM | POA: Diagnosis not present

## 2018-07-03 DIAGNOSIS — G8929 Other chronic pain: Secondary | ICD-10-CM

## 2018-07-03 DIAGNOSIS — M79605 Pain in left leg: Secondary | ICD-10-CM

## 2018-07-03 DIAGNOSIS — M25572 Pain in left ankle and joints of left foot: Principal | ICD-10-CM

## 2018-07-03 MED ORDER — MELOXICAM 15 MG PO TABS: 15.0000 mg | ORAL_TABLET | Freq: Every day | ORAL | 0 refills | Status: DC

## 2018-07-03 NOTE — Patient Instructions (Signed)
Good to see you  Posterior tib rupture.  We will get doppler done today.  compression socks on plan for sure  Ice 20 minutes 2 times daily. Usually after activity and before bed. Heel lift in your shoe if wearing shoe.  Otherwise wear the boot.  No exercises until you return in 1 week then 3 times a week  See me again in 3 weeks

## 2018-07-03 NOTE — Telephone Encounter (Signed)
Dr. Tamala Julian is aware of patient's results.

## 2018-07-03 NOTE — Telephone Encounter (Signed)
Copied from [place] [id number]. Topic: Quick Communication - See Telephone Encounter >> Jul 03, 2018  3:43 PM Blase Mess A wrote: CRM for notification. See Telephone encounter for: 07/03/18.  Danielle form [facility] wanted to let Dr. Tamala Julian know that the patient was negative for DVT in the left lower extremity [phone]

## 2018-07-03 NOTE — Assessment & Plan Note (Signed)
Large tear noted.  Significant increase in vascularity noted makes me hopeful that there is a possibility for healing.  Patient will be put in a cam walker, heel lift encouraged, we discussed compression socks.  Unfortunately with the ultrasound unable to truly compress patient's artery so I do feel that a Doppler is necessary to rule out a possible deep venous thrombosis.  Patient is supposed to be traveling tomorrow.  Patient given home exercises to start in 1 week otherwise.  Follow-up again in 3 weeks to make sure patient is improving slowly.  He may need formal physical therapy.

## 2018-07-03 NOTE — Progress Notes (Signed)
[name] [address] [place], [place] [address] Phone: [phone] Subjective:     CC: left foot and ankle   [id number]   10/05/15  Respond well to conservative therapy. Patient will continue the topical anti-inflammatories as needed. Follow-up with me on an as-needed basis as well as patient does well. If worsening symptoms patient could be a candidate for formal physical therapy and potential injection.  Updated 07/03/2018   Sierra [name] is a 63 y.o. female coming in with complaint of left foot and ankle pain. Running during the summer when she noticed the injury. Tendonitis of achilles tendon. Has had an increase of activity and it has aggravated her achilles. Ankle is swells medially. Not as bad due to moving downstairs. Limping. Has a work trip this weekend.   Onset- Chronic  Location- medial Duration-  Character-  Aggravating factors- Walking, stairs Reliving factors-  Therapies tried- Ice, diclofenac  Severity-initially 10 out of 10 now 5 out of 10     Past Medical History:  Diagnosis Date  . History of Doppler ultrasound    leg negative  2010 right  . History of Doppler ultrasound    right leg 2010 neg  . Meniere's disease    on diuretic therapy  . Normal nuclear stress test    Myoview 2006   . Shortness of breath    negative echo and stress echo 11/11 except for grade 2 diastolic dysfunction. EF is 60%. Normal CPX 11/11  . Tracheobronchopathia-osteochondroplastica 02/2010   involvement of trachea per FOB   Past Surgical History:  Procedure Laterality Date  . BRONCHOSCOPY  10/11  . DOPPLER ECHOCARDIOGRAPHY  2010   rt leg neg  . KNEE ARTHROSCOPY  2003   right  . KNEE ARTHROSCOPY  05/11/2011   Procedure: ARTHROSCOPY KNEE;  Surgeon: Lorn Junes, MD;  Location: [facility];  Service: Orthopedics;  Laterality: Right;  with lateral meniscectomy  . myoview  2006   stress- neg  . SKIN GRAFT  1963    Social History   Socioeconomic History  . Marital status: Married    Spouse name: Not on file  . Number of children: Not on file  . Years of education: Not on file  . Highest education level: Not on file  Occupational History  . Not on file  Social Needs  . Financial resource strain: Not on file  . Food insecurity:    Worry: Not on file    Inability: Not on file  . Transportation needs:    Medical: Not on file    Non-medical: Not on file  Tobacco Use  . Smoking status: Never Smoker  . Smokeless tobacco: Never Used  Substance and Sexual Activity  . Alcohol use: Yes    Alcohol/week: 0.0 standard drinks    Comment: socially 1-2 a month  . Drug use: No  . Sexual activity: Yes  Lifestyle  . Physical activity:    Days per week: Not on file    Minutes per session: Not on file  . Stress: Not on file  Relationships  . Social connections:    Talks on phone: Not on file    Gets together: Not on file    Attends religious service: Not on file    Active member of club or organization: Not on file    Attends meetings of clubs or organizations: Not on file    Relationship status: Not on file  Other Topics Concern  .  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 Morphine   . Tributyl Phosphate Hives  . Latex Rash   Family History  Problem Relation Age of Onset  . Uterine cancer Mother        sister  . Asthma Father   . Hyperlipidemia Father   . Macular degeneration Father   . Allergies Father   . Prostate cancer Father   . Multiple sclerosis Sister   . Uterine cancer Sister   . Thyroid cancer Daughter      Current Outpatient Medications (Cardiovascular):  .  triamterene-hydrochlorothiazide (MAXZIDE-25) 37.5-25 MG per tablet, Take 1 tablet by mouth. Every other day   Current Outpatient Medications (Analgesics):  .  meloxicam (MOBIC) 15 MG tablet, Take 1 tablet (15  mg total) by mouth daily.   Current Outpatient Medications (Other):  .  azithromycin (ZITHROMAX Z-PAK) 250 MG tablet, Take 2 po first day, then 1 po qd .  estradiol (ESTRACE) 0.1 MG/GM vaginal cream, Place 2 g vaginally daily. Using 2-3 times weekly .  KLOR-CON M15 15 MEQ tablet, TAKE 1 TABLET (15 MEQ TOTAL) BY MOUTH 2 (TWO) TIMES DAILY. Marland Kitchen  KLOR-CON M15 15 MEQ tablet, TAKE 1 TABLET (15 MEQ TOTAL) BY MOUTH 2 (TWO) TIMES DAILY.    Past medical history, social, surgical and family history all reviewed in electronic medical record.  No pertanent information unless stated regarding to the chief complaint.   Review of Systems:  No headache, visual changes, nausea, vomiting, diarrhea, constipation, dizziness, abdominal pain, skin rash, fevers, chills, night sweats, weight loss, swollen lymph nodes, body aches, joint swelling, muscle aches, chest pain, shortness of breath, mood changes.   Objective  Blood pressure 106/70, pulse 65, height 5\' 4"  (1.626 m), SpO2 97 %.   General: No apparent distress alert and oriented x3 mood and affect normal, dressed appropriately.  HEENT: Pupils equal, extraocular movements intact  Respiratory: Patient's speak in full sentences and does not appear short of breath  Cardiovascular: No lower extremity edema, non tender, no erythema  Skin: Warm dry intact with no signs of infection or rash on extremities or on axial skeleton.  Abdomen: Soft nontender  Neuro: Cranial nerves II through XII are intact, neurovascularly intact in all extremities with 2+ DTRs and 2+ pulses.  Lymph: No lymphadenopathy of posterior or anterior cervical chain or axillae bilaterally.  Gait normal with good balance and coordination.  MSK:  Non tender with full range of motion and good stability and symmetric strength and tone of shoulders, elbows, wrist, hip, knee bilaterally.  Ankle: Left Leg noted over the medial malleolus area.  Patient is nontender on the bone which is posterior to it.   Patient has mild pain over the navicular prominence as well.  Severe pes planus with overpronation of the hindfoot noted.  Patient's Achilles is intact with a negative Thompson.  Neurovascular intact distally Able to walk 4 steps.  Limited musculoskeletal ultrasound was performed and interpreted by Lyndal Pulley Significant intrasubstance tear noted of the posterior tibialis tendon noted today.  No significant retraction though.  Significant hypoechoic changes.  Unfortunately some of the vascular aspects is unable to be fully compressed. Impression: Posterior tibialis tendon tear no retraction   Impression and Recommendations:     This case required medical decision making of moderate complexity. The above documentation has been reviewed and  is accurate and complete Lyndal Pulley, DO       Note: This dictation was prepared with Dragon dictation along with smaller phrase technology. Any transcriptional errors that result from this process are unintentional.

## 2018-07-13 NOTE — Progress Notes (Signed)
[facility] [place], [place] [address] Phone: [phone] Subjective:   Sierra [name], am serving as a scribe for Dr. Hulan Saas.   CC: Left ankle pain and swelling  HQP:[id number]   07/03/2018: Large tear noted.  Significant increase in vascularity noted makes me hopeful that there is a possibility for healing.  Patient will be put in a cam walker, heel lift encouraged, we discussed compression socks.  Unfortunately with the ultrasound unable to truly compress patient's artery so I do feel that a Doppler is necessary to rule out a possible deep venous thrombosis.  Patient is supposed to be traveling tomorrow.  Patient given home exercises to start in 1 week otherwise.  Follow-up again in 3 weeks to make sure patient is improving slowly.  He may need formal physical therapy.  Update 07/14/2018: Sierra [name] is a 63 y.o. female coming in with complaint of left ankle pain. Patient states that she has been wearing the boot. Feels as if she is worse than last visit. Is having stabbing pains on medial malleolus.       Previous ultrasound showed the patient did have a tear of the left tibial posterior tendon.  Patient did have a Doppler done that was unremarkable for any type of deep venous thrombosis.  Past Medical History:  Diagnosis Date  . History of Doppler ultrasound    leg negative  2010 right  . History of Doppler ultrasound    right leg 2010 neg  . Meniere's disease    on diuretic therapy  . Normal nuclear stress test    Myoview 2006   . Shortness of breath    negative echo and stress echo 11/11 except for grade 2 diastolic dysfunction. EF is 60%. Normal CPX 11/11  . Tracheobronchopathia-osteochondroplastica 02/2010   involvement of trachea per FOB   Past Surgical History:  Procedure Laterality Date  . BRONCHOSCOPY  10/11  . DOPPLER ECHOCARDIOGRAPHY  2010   rt leg neg  . KNEE ARTHROSCOPY  2003   right  . KNEE ARTHROSCOPY   05/11/2011   Procedure: ARTHROSCOPY KNEE;  Surgeon: Lorn Junes, MD;  Location: [facility];  Service: Orthopedics;  Laterality: Right;  with lateral meniscectomy  . myoview  2006   stress- neg  . SKIN GRAFT  1963   Social History   Socioeconomic History  . Marital status: Married    Spouse name: Not on file  . Number of children: Not on file  . Years of education: Not on file  . Highest education level: Not on file  Occupational History  . Not on file  Social Needs  . Financial resource strain: Not on file  . Food insecurity:    Worry: Not on file    Inability: Not on file  . Transportation needs:    Medical: Not on file    Non-medical: Not on file  Tobacco Use  . Smoking status: Never Smoker  . Smokeless tobacco: Never Used  Substance and Sexual Activity  . Alcohol use: Yes    Alcohol/week: 0.0 standard drinks    Comment: socially 1-2 a month  . Drug use: [name]  . Sexual activity: Yes  Lifestyle  . Physical activity:    Days per week: Not on file    Minutes per session: Not on file  . Stress: Not on file  Relationships  . Social connections:    Talks on phone: Not on file  Gets together: Not on file    Attends religious service: Not on file    Active member of club or organization: Not on file    Attends meetings of clubs or organizations: Not on file    Relationship status: Not on file  Other Topics Concern  . 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 Morphine   . Tributyl Phosphate Hives  . Latex Rash   Family History  Problem Relation Age of Onset  . Uterine cancer Mother        sister  . Asthma Father   . Hyperlipidemia Father   . Macular degeneration Father   . Allergies Father   . Prostate cancer Father   . Multiple sclerosis Sister   . Uterine cancer Sister   . Thyroid cancer Daughter      Current Outpatient Medications  (Cardiovascular):  .  triamterene-hydrochlorothiazide (MAXZIDE-25) 37.5-25 MG per tablet, Take 1 tablet by mouth. Every other day   Current Outpatient Medications (Analgesics):  .  meloxicam (MOBIC) 15 MG tablet, Take 1 tablet (15 mg total) by mouth daily.   Current Outpatient Medications (Other):  .  azithromycin (ZITHROMAX Z-PAK) 250 MG tablet, Take 2 po first day, then 1 po qd .  estradiol (ESTRACE) 0.1 MG/GM vaginal cream, Place 2 g vaginally daily. Using 2-3 times weekly .  KLOR-CON M15 15 MEQ tablet, TAKE 1 TABLET (15 MEQ TOTAL) BY MOUTH 2 (TWO) TIMES DAILY. [facility] Kitchen  KLOR-CON M15 15 MEQ tablet, TAKE 1 TABLET (15 MEQ TOTAL) BY MOUTH 2 (TWO) TIMES DAILY.    Past medical history, social, surgical and family history all reviewed in electronic medical record.  [name] pertanent information unless stated regarding to the chief complaint.   Review of Systems:  [name] headache, visual changes, nausea, vomiting, diarrhea, constipation, dizziness, abdominal pain, skin rash, fevers, chills, night sweats, weight loss, swollen lymph nodes, body aches, joint swelling, chest pain, shortness of breath, mood changes.  Positive muscle aches  Objective  Blood pressure 112/70, pulse 67, height 5\' 4"  (1.626 m), weight 155 lb (70.3 kg), SpO2 98 %.   General: [name] apparent distress alert and oriented x3 mood and affect normal, dressed appropriately.  HEENT: Pupils equal, extraocular movements intact  Respiratory: Patient's speak in full sentences and does not appear short of breath  Cardiovascular: [name] lower extremity edema, non tender, [name] erythema  Skin: Warm dry intact with [name] signs of infection or rash on extremities or on axial skeleton.  Abdomen: Soft nontender  Neuro: Cranial nerves II through XII are intact, neurovascularly intact in all extremities with 2+ DTRs and 2+ pulses.  Lymph: [name] lymphadenopathy of posterior or anterior cervical chain or axillae bilaterally.  Gait antalgic MSK:  tender with limited  range of motion and good stability and symmetric strength and tone of shoulders, elbows, wrist, hip, knee and bilaterally.  Ankle: Left Patient still has some mild deformity of the medial malleolus Range of motion is full in all directions. Strength is 5/5 in all directions. Stable lateral and medial ligaments; squeeze test and kleiger test unremarkable; Talar dome nontender; [name] pain at base of 5th MT; [name] tenderness over cuboid; Patient tender over the posterior tibialis tendon Able to walk 4 steps. Contralateral ankle unremarkable pes planus   MSK US performed of:  This study was ordered, performed, and interpreted by Charlann Boxer  D.O.  Foot/Ankle:   Patient's posterior tibialis does have a tear noted.  Hypoechoic changes surrounding the area is improved.  Mild increase in Doppler flow noted.  Some mild interval healing since previous.  IMPRESSION: Posterior tibialis tear very mild improvement    Impression and Recommendations:     . The above documentation has been reviewed and is accurate and complete Lyndal Pulley, DO       Note: This dictation was prepared with Dragon dictation along with smaller phrase technology. Any transcriptional errors that result from this process are unintentional.

## 2018-07-14 ENCOUNTER — Ambulatory Visit: Payer: Self-pay

## 2018-07-14 ENCOUNTER — Ambulatory Visit (INDEPENDENT_AMBULATORY_CARE_PROVIDER_SITE_OTHER): Payer: 59 | Admitting: Family Medicine

## 2018-07-14 VITALS — BP 112/70 | HR 67 | Ht 64.0 in | Wt 155.0 lb

## 2018-07-14 DIAGNOSIS — M25572 Pain in left ankle and joints of left foot: Secondary | ICD-10-CM

## 2018-07-14 DIAGNOSIS — G8929 Other chronic pain: Secondary | ICD-10-CM

## 2018-07-14 DIAGNOSIS — M66872 Spontaneous rupture of other tendons, left ankle and foot: Secondary | ICD-10-CM | POA: Diagnosis not present

## 2018-07-14 NOTE — Assessment & Plan Note (Signed)
Room for improvement.  Still been cam [email or web address] Discussed HEP ROM  RTC in 3 weeks

## 2018-07-14 NOTE — Patient Instructions (Signed)
Good to see you  Ice is your friend I think you will do great overall  Keep the boot daily another 2 weeks When sitting come out of the boot and move ankle a little In the 3 rd week use the shoe with a heel lift Look at [email or web address] size medium ankle sleeve for your trip  See me again in 3 weeks

## 2018-08-03 NOTE — Progress Notes (Signed)
[name] [place], [place] [address] Phone: [phone] Subjective:   Sierra [name], am serving as a scribe for Dr. Hulan Saas.   CC: Ankle pain follow-up  EUM:[id number]    07/14/2018: Room for improvement.  Still been cam [email or web address] Discussed HEP ROM  RTC in 3 weeks  Update 08/04/2018:  Sierra [name] is a 63 y.o. female coming in with complaint of left ankle pain.  Found to have a posterior tibialis tendon tear.  Has been in a cam walker and increasing activity slowly. Patient has felt improvement. 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 Does have soreness with any active day though. Does wear boot outside of house but takes boot off and works on ROM at home.        Past Medical History:  Diagnosis Date  . History of Doppler ultrasound    leg negative  2010 right  . History of Doppler ultrasound    right leg 2010 neg  . Meniere's disease    on diuretic therapy  . Normal nuclear stress test    Myoview 2006   . Shortness of breath    negative echo and stress echo 11/11 except for grade 2 diastolic dysfunction. EF is 60%. Normal CPX 11/11  . Tracheobronchopathia-osteochondroplastica 02/2010   involvement of trachea per FOB   Past Surgical History:  Procedure Laterality Date  . BRONCHOSCOPY  10/11  . DOPPLER ECHOCARDIOGRAPHY  2010   rt leg neg  . KNEE ARTHROSCOPY  2003   right  . KNEE ARTHROSCOPY  05/11/2011   Procedure: ARTHROSCOPY KNEE;  Surgeon: Lorn Junes, MD;  Location: [facility];  Service: Orthopedics;  Laterality: Right;  with lateral meniscectomy  . myoview  2006   stress- neg  . SKIN GRAFT  1963   Social History   Socioeconomic History  . Marital status: Married    Spouse name: Not on file  . Number of children: Not on file  . Years of education: Not on file  . Highest education level: Not on file  Occupational  History  . Not on file  Social Needs  . Financial resource strain: Not on file  . Food insecurity:    Worry: Not on file    Inability: Not on file  . Transportation needs:    Medical: Not on file    Non-medical: Not on file  Tobacco Use  . Smoking status: Never Smoker  . Smokeless tobacco: Never Used  Substance and Sexual Activity  . Alcohol use: Yes    Alcohol/week: 0.0 standard drinks    Comment: socially 1-2 a month  . Drug use: [name]  . Sexual activity: Yes  Lifestyle  . Physical activity:    Days per week: Not on file    Minutes per session: Not on file  . Stress: Not on file  Relationships  . Social connections:    Talks on phone: Not on file    Gets together: Not on file    Attends religious service: Not on file    Active member of club or organization: Not on file    Attends meetings of clubs or organizations: Not on file    Relationship status: Not on file  Other Topics Concern  . Not on file  Social History Narrative   Regular Exercise- Yes   Occupation: Chief Executive Officer  HH of 2   3 Children   Allergies  Allergen Reactions  . Morphine   . Tributyl Phosphate Hives  . Latex Rash   Family History  Problem Relation Age of Onset  . Uterine cancer Mother        sister  . Asthma Father   . Hyperlipidemia Father   . Macular degeneration Father   . Allergies Father   . Prostate cancer Father   . Multiple sclerosis Sister   . Uterine cancer Sister   . Thyroid cancer Daughter      Current Outpatient Medications (Cardiovascular):  .  triamterene-hydrochlorothiazide (MAXZIDE-25) 37.5-25 MG per tablet, Take 1 tablet by mouth. Every other day   Current Outpatient Medications (Analgesics):  .  meloxicam (MOBIC) 15 MG tablet, Take 1 tablet (15 mg total) by mouth daily.   Current Outpatient Medications (Other):  .  azithromycin (ZITHROMAX Z-PAK) 250 MG tablet, Take 2 po first day, then 1 po qd .  estradiol (ESTRACE) 0.1 MG/GM vaginal cream, Place 2 g vaginally  daily. Using 2-3 times weekly .  KLOR-CON M15 15 MEQ tablet, TAKE 1 TABLET (15 MEQ TOTAL) BY MOUTH 2 (TWO) TIMES DAILY. Marland Kitchen  KLOR-CON M15 15 MEQ tablet, TAKE 1 TABLET (15 MEQ TOTAL) BY MOUTH 2 (TWO) TIMES DAILY.    Past medical history, social, surgical and family history all reviewed in electronic medical record.  [name] pertanent information unless stated regarding to the chief complaint.   Review of Systems:  [name] headache, visual changes, nausea, vomiting, diarrhea, constipation, dizziness, abdominal pain, skin rash, fevers, chills, night sweats, weight loss, swollen lymph nodes, body aches, joint swelling, muscle aches, chest pain, shortness of breath, mood changes.   Objective  Blood pressure 104/76, pulse 76, height 5\' 4"  (1.626 m), weight 155 lb (70.3 kg), SpO2 96 %.    General: [name] apparent distress alert and oriented x3 mood and affect normal, dressed appropriately.  HEENT: Pupils equal, extraocular movements intact  Respiratory: Patient's speak in full sentences and does not appear short of breath  Cardiovascular: [name] lower extremity edema, non tender, [name] erythema  Skin: Warm dry intact with [name] signs of infection or rash on extremities or on axial skeleton.  Abdomen: Soft nontender  Neuro: Cranial nerves II through XII are intact, neurovascularly intact in all extremities with 2+ DTRs and 2+ pulses.  Lymph: [name] lymphadenopathy of posterior or anterior cervical chain or axillae bilaterally.  Gait antalgic MSK:  Non tender with full range of motion and good stability and symmetric strength and tone of shoulders, elbows, wrist, hip, knee bilaterally.  Ankle: Left ankle Pes planus noted Range of motion is full in all directions. Strength is 5/5 in all directions. Stable lateral and medial ligaments; squeeze test and kleiger test unremarkable; Pain level is noted over the posterior tibialis tendon just posterior and inferior to the medial malleolus.  Negative tarsal Tinel sign  MSK US  performed of: Ankle This study was ordered, performed, and interpreted by Charlann Boxer D.O.  Foot/Ankle:   Limited ultrasound shows the patient has significant decrease in the posterior tibialis tendon sheath inflammation.  Tearing does show interval healing as well.  Increase in Doppler flow and neovascularization noted.  [name] significant abnormality of the navicular bone.   IMPRESSION: Interval healing of the posterior tibialis tear.    Impression and Recommendations:     This case required medical decision making of moderate complexity. The above documentation has been reviewed and is accurate and complete Olevia Bowens  Tamala Julian, DO       Note: This dictation was prepared with Dragon dictation along with smaller phrase technology. Any transcriptional errors that result from this process are unintentional.

## 2018-08-04 ENCOUNTER — Encounter: Payer: Self-pay | Admitting: Family Medicine

## 2018-08-04 ENCOUNTER — Ambulatory Visit (INDEPENDENT_AMBULATORY_CARE_PROVIDER_SITE_OTHER): Payer: 59 | Admitting: Family Medicine

## 2018-08-04 ENCOUNTER — Ambulatory Visit: Payer: Self-pay

## 2018-08-04 VITALS — BP 104/76 | HR 76 | Ht 64.0 in | Wt 155.0 lb

## 2018-08-04 DIAGNOSIS — M79672 Pain in left foot: Secondary | ICD-10-CM

## 2018-08-04 DIAGNOSIS — M66872 Spontaneous rupture of other tendons, left ankle and foot: Secondary | ICD-10-CM | POA: Diagnosis not present

## 2018-08-04 NOTE — Patient Instructions (Signed)
So impressed Stay active Ice is your friend Rigid sole shoe starting next week.  Ice after standing a lot Exercises 3 times a week.  See me again in 4-6 weeks for one more time

## 2018-08-04 NOTE — Assessment & Plan Note (Signed)
Patient has had interval healing.  Discussed icing regimen and home exercises.  Discussed transitioning to a shoe in the next 7 days.  Discussed worsening symptoms can do more compression.  Can also go back to the cam walker if needed.  If any worsening pain to call sooner otherwise follow-up again in 4 to 6 weeks to be further released to more active exercises.

## 2018-09-09 ENCOUNTER — Ambulatory Visit: Payer: 59 | Admitting: Family Medicine

## 2018-10-31 ENCOUNTER — Other Ambulatory Visit: Payer: Self-pay

## 2018-10-31 DIAGNOSIS — Z20822 Contact with and (suspected) exposure to covid-19: Secondary | ICD-10-CM

## 2018-10-31 DIAGNOSIS — Z20828 Contact with and (suspected) exposure to other viral communicable diseases: Secondary | ICD-10-CM

## 2018-11-05 ENCOUNTER — Ambulatory Visit: Payer: 59 | Admitting: Internal Medicine

## 2018-11-05 NOTE — Progress Notes (Signed)
Virtual Visit via Video Note  I connected with@ on 11/06/18 at 10:15 AM EDT by a video enabled telemedicine application and verified that I am speaking with the correct person using two identifiers. Location patient: home Location provider:work office Persons participating in the virtual visit: patient, provider  [place] national recommendations  regarding COVID 19 pandemic   video visit is advised over in office visit for this patient.  Patient aware  of the limitations of evaluation and management by telemedicine and  availability of in person appointments. and agreed to proceed.   HPI: Sierra [name] presents for video visit Number of issues Problem #1 requesting COVID antibody testing.  Son-in-law and daughter-in-law had illness and some travel in January February son-in-law never screened for COVID but had a positive antibody test afterwards.  They were living in her household after recovery.  Husband has had a negative antibody test but did have an illness except for fever a month or so ago.  She currently has no symptoms of COVID did have a flareup of her Mnire's that was felt to be a migraine equivalent.  But no coughing. Problem #2 right ears been bothering her the last few days not a pain feels like there is water in the ear better in the morning worse as the day goes on has tried some home remedies not sure what is causing it no fever cough etc.  She has seen Dr. Elwyn Reach in the past for her Mnire's but he is moved practices location and may not follow-up at that time. Problem #3 has delayed her dermatology appointment but recently had what she calls an explosion of those tiny red spots that are sound like angiomas but no unusual bleeding or other issues wonders how important this is. Generally she is well is physically distancing but return to work will be difficult.    ROS: See pertinent positives and negatives per HPI.  Past Medical History:  Diagnosis Date  . History of  Doppler ultrasound    leg negative  2010 right  . History of Doppler ultrasound    right leg 2010 neg  . Meniere's disease    on diuretic therapy  . Normal nuclear stress test    Myoview 2006   . Shortness of breath    negative echo and stress echo 11/11 except for grade 2 diastolic dysfunction. EF is 60%. Normal CPX 11/11  . Tracheobronchopathia-osteochondroplastica 02/2010   involvement of trachea per FOB    Past Surgical History:  Procedure Laterality Date  . BRONCHOSCOPY  10/11  . DOPPLER ECHOCARDIOGRAPHY  2010   rt leg neg  . KNEE ARTHROSCOPY  2003   right  . KNEE ARTHROSCOPY  05/11/2011   Procedure: ARTHROSCOPY KNEE;  Surgeon: Lorn Junes, MD;  Location: [facility];  Service: Orthopedics;  Laterality: Right;  with lateral meniscectomy  . myoview  2006   stress- neg  . SKIN GRAFT  1963    Family History  Problem Relation Age of Onset  . Uterine cancer Mother        sister  . Asthma Father   . Hyperlipidemia Father   . Macular degeneration Father   . Allergies Father   . Prostate cancer Father   . Multiple sclerosis Sister   . Uterine cancer Sister   . Thyroid cancer Daughter     Social History   Tobacco Use  . Smoking status: Never Smoker  . Smokeless tobacco: Never Used  Substance Use Topics  .  Alcohol use: Yes    Alcohol/week: 0.0 standard drinks    Comment: socially 1-2 a month  . Drug use: No      Current Outpatient Medications:  .  azithromycin (ZITHROMAX Z-PAK) 250 MG tablet, Take 2 po first day, then 1 po qd, Disp: 6 tablet, Rfl: 0 .  estradiol (ESTRACE) 0.1 MG/GM vaginal cream, Place 2 g vaginally daily. Using 2-3 times weekly, Disp: , Rfl:  .  KLOR-CON M15 15 MEQ tablet, TAKE 1 TABLET (15 MEQ TOTAL) BY MOUTH 2 (TWO) TIMES DAILY., Disp: 60 tablet, Rfl: 0 .  KLOR-CON M15 15 MEQ tablet, TAKE 1 TABLET (15 MEQ TOTAL) BY MOUTH 2 (TWO) TIMES DAILY., Disp: 60 tablet, Rfl: 1 .  meloxicam (MOBIC) 15 MG tablet, Take 1 tablet (15 mg  total) by mouth daily., Disp: 30 tablet, Rfl: 0 .  triamterene-hydrochlorothiazide (MAXZIDE-25) 37.5-25 MG per tablet, Take 1 tablet by mouth. Every other day, Disp: , Rfl:   EXAM: BP Readings from Last 3 Encounters:  08/04/18 104/76  07/14/18 112/70  07/03/18 106/70    VITALS per patient if applicable: She looks very well with no congestion cough or speech difficulty.  GENERAL: alert, oriented, appears well and in no acute distress  HEENT: atraumatic, conjunttiva clear, no obvious abnormalities on inspection of external nose and ears  NECK: normal movements of the head and neck  LUNGS: on inspection no signs of respiratory distress, breathing rate appears normal, no obvious gross SOB, gasping or wheezing  CV: no obvious cyanosis   PSYCH/NEURO: pleasant and cooperative, no obvious depression or anxiety, speech and thought processing grossly intact Lab Results  Component Value Date   WBC 4.6 11/27/2016   HGB 14.4 11/27/2016   HCT 43.1 11/27/2016   PLT 252.0 11/27/2016   GLUCOSE 88 12/03/2017   CHOL 210 (H) 07/26/2016   TRIG 59.0 07/26/2016   HDL 85.50 07/26/2016   LDLDIRECT 112.2 08/22/2012   LDLCALC 112 (H) 07/26/2016   ALT 21 11/27/2017   AST 22 11/27/2017   NA 141 11/27/2017   K 4.2 11/27/2017   CL 105 11/27/2017   CREATININE 0.59 11/27/2017   BUN 16 11/27/2017   CO2 27 11/27/2017   TSH 1.19 11/27/2016   INR 1.2 (H) 09/21/2010   HGBA1C 6.0 12/03/2017    ASSESSMENT AND PLAN:  Discussed the following assessment and plan: 1. Immunity status testing poss remote covid 19 exposure No sx   See text  Immunity not guaranteed by pos testing  2. Ear symptom   3. Angioma of skin ? Possible remote exposure to COVID immunity testing risk-benefit discussed the order for COVID antibody is already in the system for the illness site under Dr. Judeen Hammans name.  Can get blood work done there risk-benefit discussed. If her ear keeps bothering her with no other fever etc. she  can make an appointment come in person and we can look in her right ear. The angiomas which have increased recently do not feel alarming to me at this point but we can check it if anything else going on I do not think she has liver disease causing this.  Counseled.   Expectant management and discussion of plan and treatment with opportunity to ask questions and all were answered. The patient agreed with the plan and demonstrated an understanding of the instructions.   Advised to call back or seek an in-person evaluation if worsening  or having  further concerns . Due for lab work in the summer at some  point.  Shanon Ace, MD

## 2018-11-06 ENCOUNTER — Encounter: Payer: Self-pay | Admitting: Internal Medicine

## 2018-11-06 ENCOUNTER — Other Ambulatory Visit: Payer: 59

## 2018-11-06 ENCOUNTER — Other Ambulatory Visit: Payer: Self-pay

## 2018-11-06 ENCOUNTER — Ambulatory Visit (INDEPENDENT_AMBULATORY_CARE_PROVIDER_SITE_OTHER): Payer: 59 | Admitting: Internal Medicine

## 2018-11-06 DIAGNOSIS — D1801 Hemangioma of skin and subcutaneous tissue: Secondary | ICD-10-CM

## 2018-11-06 DIAGNOSIS — Z20828 Contact with and (suspected) exposure to other viral communicable diseases: Secondary | ICD-10-CM

## 2018-11-06 DIAGNOSIS — Z0184 Encounter for antibody response examination: Secondary | ICD-10-CM | POA: Diagnosis not present

## 2018-11-06 DIAGNOSIS — R6889 Other general symptoms and signs: Secondary | ICD-10-CM | POA: Diagnosis not present

## 2018-11-06 DIAGNOSIS — Z20822 Contact with and (suspected) exposure to covid-19: Secondary | ICD-10-CM

## 2018-11-07 LAB — SAR COV2 SEROLOGY (COVID19)AB(IGG),IA: SARS CoV2 AB IGG: NEGATIVE

## 2018-11-12 ENCOUNTER — Other Ambulatory Visit (HOSPITAL_COMMUNITY): Payer: Self-pay | Admitting: Orthopedic Surgery

## 2018-12-05 ENCOUNTER — Encounter (HOSPITAL_BASED_OUTPATIENT_CLINIC_OR_DEPARTMENT_OTHER): Payer: Self-pay | Admitting: *Deleted

## 2018-12-05 ENCOUNTER — Other Ambulatory Visit: Payer: Self-pay

## 2018-12-08 ENCOUNTER — Encounter (HOSPITAL_BASED_OUTPATIENT_CLINIC_OR_DEPARTMENT_OTHER)
Admission: RE | Admit: 2018-12-08 | Discharge: 2018-12-08 | Disposition: A | Discharge status: Home / Self Care | Payer: 59 | Source: Ambulatory Visit | Attending: Orthopedic Surgery | Admitting: Orthopedic Surgery

## 2018-12-08 ENCOUNTER — Other Ambulatory Visit: Payer: Self-pay

## 2018-12-08 ENCOUNTER — Other Ambulatory Visit (HOSPITAL_COMMUNITY)
Admission: RE | Admit: 2018-12-08 | Discharge: 2018-12-08 | Disposition: A | Discharge status: Home / Self Care | Payer: 59 | Source: Ambulatory Visit | Attending: Orthopedic Surgery | Admitting: Orthopedic Surgery

## 2018-12-08 DIAGNOSIS — Z1159 Encounter for screening for other viral diseases: Secondary | ICD-10-CM | POA: Diagnosis not present

## 2018-12-08 LAB — BASIC METABOLIC PANEL
Anion gap: 8 (ref 5–15)
BUN: 8 mg/dL (ref 8–23)
CO2: 25 mmol/L (ref 22–32)
Calcium: 9.8 mg/dL (ref 8.9–10.3)
Chloride: 108 mmol/L (ref 98–111)
Creatinine, Ser: 0.51 mg/dL (ref 0.44–1.00)
GFR calc Af Amer: >60 mL/min (ref >60)
GFR calc non Af Amer: >60 mL/min (ref >60)
Glucose, Bld: 97 mg/dL (ref 70–99)
Potassium: 4.5 mmol/L (ref 3.5–5.1)
Sodium: 141 mmol/L (ref 135–145)

## 2018-12-08 NOTE — Progress Notes (Signed)
Ensure pre surgery drink given with instructions, pt verbalized understanding.

## 2018-12-09 LAB — SARS CORONAVIRUS 2 (TAT 6-24 HRS): SARS Coronavirus 2: NEGATIVE

## 2018-12-11 ENCOUNTER — Ambulatory Visit (HOSPITAL_BASED_OUTPATIENT_CLINIC_OR_DEPARTMENT_OTHER): Payer: 59 | Admitting: Anesthesiology

## 2018-12-11 ENCOUNTER — Other Ambulatory Visit: Payer: Self-pay

## 2018-12-11 ENCOUNTER — Ambulatory Visit (HOSPITAL_BASED_OUTPATIENT_CLINIC_OR_DEPARTMENT_OTHER)
Admission: RE | Admit: 2018-12-11 | Discharge: 2018-12-11 | Disposition: A | Discharge status: Home / Self Care | Payer: 59 | Attending: Orthopedic Surgery | Admitting: Orthopedic Surgery

## 2018-12-11 ENCOUNTER — Encounter (HOSPITAL_BASED_OUTPATIENT_CLINIC_OR_DEPARTMENT_OTHER)
Admission: RE | Disposition: A | Discharge status: Home / Self Care | Payer: Self-pay | Source: Home / Self Care | Attending: Orthopedic Surgery

## 2018-12-11 ENCOUNTER — Encounter (HOSPITAL_BASED_OUTPATIENT_CLINIC_OR_DEPARTMENT_OTHER): Payer: Self-pay

## 2018-12-11 DIAGNOSIS — Z808 Family history of malignant neoplasm of other organs or systems: Secondary | ICD-10-CM | POA: Diagnosis not present

## 2018-12-11 DIAGNOSIS — Z885 Allergy status to narcotic agent status: Secondary | ICD-10-CM | POA: Diagnosis not present

## 2018-12-11 DIAGNOSIS — M2041 Other hammer toe(s) (acquired), right foot: Secondary | ICD-10-CM | POA: Diagnosis not present

## 2018-12-11 DIAGNOSIS — Z8042 Family history of malignant neoplasm of prostate: Secondary | ICD-10-CM | POA: Insufficient documentation

## 2018-12-11 DIAGNOSIS — M21611 Bunion of right foot: Secondary | ICD-10-CM | POA: Insufficient documentation

## 2018-12-11 DIAGNOSIS — Z79899 Other long term (current) drug therapy: Secondary | ICD-10-CM | POA: Insufficient documentation

## 2018-12-11 DIAGNOSIS — Z8249 Family history of ischemic heart disease and other diseases of the circulatory system: Secondary | ICD-10-CM | POA: Diagnosis not present

## 2018-12-11 DIAGNOSIS — Z8049 Family history of malignant neoplasm of other genital organs: Secondary | ICD-10-CM | POA: Diagnosis not present

## 2018-12-11 DIAGNOSIS — M7741 Metatarsalgia, right foot: Secondary | ICD-10-CM | POA: Diagnosis not present

## 2018-12-11 DIAGNOSIS — Z791 Long term (current) use of non-steroidal anti-inflammatories (NSAID): Secondary | ICD-10-CM | POA: Insufficient documentation

## 2018-12-11 DIAGNOSIS — Z888 Allergy status to other drugs, medicaments and biological substances status: Secondary | ICD-10-CM | POA: Insufficient documentation

## 2018-12-11 DIAGNOSIS — Z91040 Latex allergy status: Secondary | ICD-10-CM | POA: Insufficient documentation

## 2018-12-11 DIAGNOSIS — H8109 Meniere's disease, unspecified ear: Secondary | ICD-10-CM | POA: Insufficient documentation

## 2018-12-11 DIAGNOSIS — Z825 Family history of asthma and other chronic lower respiratory diseases: Secondary | ICD-10-CM | POA: Insufficient documentation

## 2018-12-11 DIAGNOSIS — Z820 Family history of epilepsy and other diseases of the nervous system: Secondary | ICD-10-CM | POA: Diagnosis not present

## 2018-12-11 HISTORY — PX: BUNIONECTOMY WITH WEIL OSTEOTOMY: SHX5604

## 2018-12-11 SURGERY — BUNIONECTOMY WITH WEIL OSTEOTOMY
Anesthesia: General | Site: Foot | Laterality: Right

## 2018-12-11 MED ORDER — CEFAZOLIN SODIUM-DEXTROSE 2-4 GM/100ML-% IV SOLN
INTRAVENOUS | Status: AC
  Filled 2018-12-11: qty 100

## 2018-12-11 MED ORDER — SENNA 8.6 MG PO TABS: 2.0000 | ORAL_TABLET | Freq: Two times a day (BID) | ORAL | 0 refills | Status: DC

## 2018-12-11 MED ORDER — CHLORHEXIDINE GLUCONATE 4 % EX LIQD: 60.0000 mL | Freq: Once | CUTANEOUS | Status: DC

## 2018-12-11 MED ORDER — FENTANYL CITRATE (PF) 100 MCG/2ML IJ SOLN: 25.0000 ug | INTRAMUSCULAR | Status: DC | PRN

## 2018-12-11 MED ORDER — DOCUSATE SODIUM 100 MG PO CAPS: 100.0000 mg | ORAL_CAPSULE | Freq: Two times a day (BID) | ORAL | 0 refills | Status: DC

## 2018-12-11 MED ORDER — OXYCODONE HCL 5 MG PO TABS: 5.0000 mg | ORAL_TABLET | ORAL | 0 refills | Status: AC | PRN

## 2018-12-11 MED ORDER — MIDAZOLAM HCL 2 MG/2ML IJ SOLN
INTRAMUSCULAR | Status: AC
  Filled 2018-12-11: qty 2

## 2018-12-11 MED ORDER — CLONIDINE HCL (ANALGESIA) 100 MCG/ML EP SOLN
EPIDURAL | Status: DC | PRN
  Administered 2018-12-11: 100 ug

## 2018-12-11 MED ORDER — DEXAMETHASONE SODIUM PHOSPHATE 10 MG/ML IJ SOLN
INTRAMUSCULAR | Status: DC | PRN
  Administered 2018-12-11: 10 mg via INTRAVENOUS

## 2018-12-11 MED ORDER — SCOPOLAMINE 1 MG/3DAYS TD PT72: 1.0000 | MEDICATED_PATCH | Freq: Once | TRANSDERMAL | Status: DC

## 2018-12-11 MED ORDER — PROPOFOL 10 MG/ML IV BOLUS
INTRAVENOUS | Status: DC | PRN
  Administered 2018-12-11: 120 mg via INTRAVENOUS

## 2018-12-11 MED ORDER — BUPIVACAINE-EPINEPHRINE (PF) 0.5% -1:200000 IJ SOLN
INTRAMUSCULAR | Status: DC | PRN
  Administered 2018-12-11: 20 mL via PERINEURAL
  Administered 2018-12-11: 10 mL via PERINEURAL

## 2018-12-11 MED ORDER — ACETAMINOPHEN 500 MG PO TABS
1000.0000 mg | ORAL_TABLET | Freq: Once | ORAL | Status: AC
  Administered 2018-12-11: 1000 mg via ORAL

## 2018-12-11 MED ORDER — SODIUM CHLORIDE 0.9 % IV SOLN: INTRAVENOUS | Status: DC

## 2018-12-11 MED ORDER — EPHEDRINE SULFATE 50 MG/ML IJ SOLN
INTRAMUSCULAR | Status: DC | PRN
  Administered 2018-12-11 (×2): 10 mg via INTRAVENOUS
  Administered 2018-12-11: 5 mg via INTRAVENOUS

## 2018-12-11 MED ORDER — PROMETHAZINE HCL 25 MG/ML IJ SOLN: 6.2500 mg | INTRAMUSCULAR | Status: DC | PRN

## 2018-12-11 MED ORDER — FENTANYL CITRATE (PF) 100 MCG/2ML IJ SOLN
INTRAMUSCULAR | Status: AC
  Filled 2018-12-11: qty 2

## 2018-12-11 MED ORDER — FENTANYL CITRATE (PF) 100 MCG/2ML IJ SOLN
50.0000 ug | INTRAMUSCULAR | Status: DC | PRN
  Administered 2018-12-11: 100 ug via INTRAVENOUS

## 2018-12-11 MED ORDER — PROMETHAZINE HCL 12.5 MG PO TABS: 12.5000 mg | ORAL_TABLET | Freq: Four times a day (QID) | ORAL | 0 refills | Status: DC | PRN

## 2018-12-11 MED ORDER — ASPIRIN EC 81 MG PO TBEC: 81.0000 mg | DELAYED_RELEASE_TABLET | Freq: Two times a day (BID) | ORAL | 0 refills | Status: DC

## 2018-12-11 MED ORDER — CEFAZOLIN SODIUM-DEXTROSE 2-4 GM/100ML-% IV SOLN
2.0000 g | INTRAVENOUS | Status: AC
  Administered 2018-12-11: 2 g via INTRAVENOUS

## 2018-12-11 MED ORDER — MIDAZOLAM HCL 2 MG/2ML IJ SOLN
1.0000 mg | INTRAMUSCULAR | Status: DC | PRN
  Administered 2018-12-11: 2 mg via INTRAVENOUS

## 2018-12-11 MED ORDER — ONDANSETRON HCL 4 MG/2ML IJ SOLN
INTRAMUSCULAR | Status: DC | PRN
  Administered 2018-12-11: 4 mg via INTRAVENOUS

## 2018-12-11 MED ORDER — LIDOCAINE 2% (20 MG/ML) 5 ML SYRINGE
INTRAMUSCULAR | Status: DC | PRN
  Administered 2018-12-11: 30 mg via INTRAVENOUS

## 2018-12-11 MED ORDER — LACTATED RINGERS IV SOLN
INTRAVENOUS | Status: DC
  Administered 2018-12-11: 08:00:00 via INTRAVENOUS

## 2018-12-11 MED ORDER — ACETAMINOPHEN 500 MG PO TABS
ORAL_TABLET | ORAL | Status: AC
  Filled 2018-12-11: qty 2

## 2018-12-11 SURGICAL SUPPLY — 83 items
APL PRP STRL LF DISP 70% ISPRP (MISCELLANEOUS) ×1
BANDAGE ESMARK 6X9 LF (GAUZE/BANDAGES/DRESSINGS) IMPLANT
BIT DRILL 2.4 AO COUPLING CANN (BIT) ×3 IMPLANT
BIT DRILL CANN 2.4 (BIT) ×2
BIT DRILL CANN 2.4MM (BIT) ×1
BIT DRILL CANN MAX VPC 2.4 (BIT) ×1 IMPLANT
BLADE AVERAGE 25MMX9MM (BLADE)
BLADE AVERAGE 25X9 (BLADE) IMPLANT
BLADE LONG MED 25X9 (BLADE) ×2 IMPLANT
BLADE LONG MED 25X9MM (BLADE) ×1
BLADE MICRO SAGITTAL (BLADE) IMPLANT
BLADE OSC/SAG .038X5.5 CUT EDG (BLADE) IMPLANT
BLADE SURG 15 STRL LF DISP TIS (BLADE) ×3 IMPLANT
BLADE SURG 15 STRL SS (BLADE) ×9
BNDG CMPR 9X4 STRL LF SNTH (GAUZE/BANDAGES/DRESSINGS)
BNDG CMPR 9X6 STRL LF SNTH (GAUZE/BANDAGES/DRESSINGS)
BNDG COHESIVE 4X5 TAN STRL (GAUZE/BANDAGES/DRESSINGS) ×3 IMPLANT
BNDG COHESIVE 6X5 TAN STRL LF (GAUZE/BANDAGES/DRESSINGS) ×3 IMPLANT
BNDG CONFORM 3 STRL LF (GAUZE/BANDAGES/DRESSINGS) ×3 IMPLANT
BNDG ESMARK 4X9 LF (GAUZE/BANDAGES/DRESSINGS) IMPLANT
BNDG ESMARK 6X9 LF (GAUZE/BANDAGES/DRESSINGS)
CHLORAPREP W/TINT 26 (MISCELLANEOUS) ×3 IMPLANT
COVER BACK TABLE REUSABLE LG (DRAPES) ×3 IMPLANT
COVER WAND RF STERILE (DRAPES) IMPLANT
CUFF TOURN SGL QUICK 24 (TOURNIQUET CUFF)
CUFF TOURN SGL QUICK 34 (TOURNIQUET CUFF)
CUFF TRNQT CYL 24X4X16.5-23 (TOURNIQUET CUFF) IMPLANT
CUFF TRNQT CYL 34X4.125X (TOURNIQUET CUFF) IMPLANT
DRAPE EXTREMITY T 121X128X90 (DISPOSABLE) ×3 IMPLANT
DRAPE HALF SHEET 70X43 (DRAPES) ×3 IMPLANT
DRAPE OEC MINIVIEW 54X84 (DRAPES) ×3 IMPLANT
DRAPE U-SHAPE 47X51 STRL (DRAPES) ×3 IMPLANT
DRSG MEPITEL 4X7.2 (GAUZE/BANDAGES/DRESSINGS) ×3 IMPLANT
DRSG PAD ABDOMINAL 8X10 ST (GAUZE/BANDAGES/DRESSINGS) ×3 IMPLANT
ELECT REM PT RETURN 9FT ADLT (ELECTROSURGICAL) ×3
ELECTRODE REM PT RTRN 9FT ADLT (ELECTROSURGICAL) ×1 IMPLANT
GAUZE SPONGE 4X4 12PLY STRL (GAUZE/BANDAGES/DRESSINGS) ×3 IMPLANT
GLOVE BIO SURGEON STRL SZ8 (GLOVE) ×3 IMPLANT
GLOVE BIOGEL PI IND STRL 8 (GLOVE) ×2 IMPLANT
GLOVE BIOGEL PI INDICATOR 8 (GLOVE) ×4
GLOVE ECLIPSE 8.0 STRL XLNG CF (GLOVE) ×3 IMPLANT
GOWN STRL REUS W/ TWL LRG LVL3 (GOWN DISPOSABLE) ×1 IMPLANT
GOWN STRL REUS W/ TWL XL LVL3 (GOWN DISPOSABLE) ×2 IMPLANT
GOWN STRL REUS W/TWL LRG LVL3 (GOWN DISPOSABLE) ×3
GOWN STRL REUS W/TWL XL LVL3 (GOWN DISPOSABLE) ×6
K-WIRE .054X4 (WIRE) IMPLANT
K-WIRE COCR 1.1X105 (WIRE) ×3
K-WIRE TROC 1.25X150 (WIRE) ×3
KWIRE COCR 1.1X105 (WIRE) IMPLANT
KWIRE TROC 1.25X150 (WIRE) ×1 IMPLANT
NEEDLE HYPO 22GX1.5 SAFETY (NEEDLE) IMPLANT
NEEDLE HYPO 25X1 1.5 SAFETY (NEEDLE) IMPLANT
NS IRRIG 1000ML POUR BTL (IV SOLUTION) ×3 IMPLANT
PACK BASIN DAY SURGERY FS (CUSTOM PROCEDURE TRAY) ×3 IMPLANT
PAD CAST 4YDX4 CTTN HI CHSV (CAST SUPPLIES) ×1 IMPLANT
PADDING CAST COTTON 4X4 STRL (CAST SUPPLIES) ×3
PADDING CAST COTTON 6X4 STRL (CAST SUPPLIES) ×3 IMPLANT
PENCIL BUTTON HOLSTER BLD 10FT (ELECTRODE) ×3 IMPLANT
SANITIZER HAND PURELL 535ML FO (MISCELLANEOUS) ×3 IMPLANT
SCREW CANN FT 4.0X38MM (Screw) ×3 IMPLANT
SCREW CANN PT 4X36 NS (Screw) IMPLANT
SCREW CANNULATED NS 4MMX36MM (Screw) ×3 IMPLANT
SCREW VPC 3.4X16 (Screw) ×2 IMPLANT
SLEEVE SCD COMPRESS KNEE MED (MISCELLANEOUS) ×3 IMPLANT
SPLINT FAST PLASTER 5X30 (CAST SUPPLIES) ×40
SPLINT PLASTER CAST FAST 5X30 (CAST SUPPLIES) ×20 IMPLANT
SPONGE LAP 18X18 RF (DISPOSABLE) ×3 IMPLANT
STOCKINETTE 6  STRL (DRAPES) ×2
STOCKINETTE 6 STRL (DRAPES) ×1 IMPLANT
SUCTION FRAZIER HANDLE 10FR (MISCELLANEOUS) ×2
SUCTION TUBE FRAZIER 10FR DISP (MISCELLANEOUS) ×1 IMPLANT
SUT ETHILON 3 0 PS 1 (SUTURE) ×3 IMPLANT
SUT MNCRL AB 3-0 PS2 18 (SUTURE) ×3 IMPLANT
SUT VIC AB 0 SH 27 (SUTURE) IMPLANT
SUT VIC AB 2-0 SH 27 (SUTURE)
SUT VIC AB 2-0 SH 27XBRD (SUTURE) IMPLANT
SUT VICRYL 0 UR6 27IN ABS (SUTURE) IMPLANT
SYR BULB 3OZ (MISCELLANEOUS) ×3 IMPLANT
SYR CONTROL 10ML LL (SYRINGE) IMPLANT
TOWEL GREEN STERILE FF (TOWEL DISPOSABLE) ×6 IMPLANT
TUBE CONNECTING 20'X1/4 (TUBING) ×1
TUBE CONNECTING 20X1/4 (TUBING) ×2 IMPLANT
UNDERPAD 30X30 (UNDERPADS AND DIAPERS) ×3 IMPLANT

## 2018-12-11 NOTE — Op Note (Signed)
12/11/2018  10:11 AM  PATIENT:  Sierra [name]  63 y.o. female  PRE-OPERATIVE DIAGNOSIS: 1.  Painful right foot bunion deformity      2.  Right forefoot metatarsalgia      3.  Right second hammertoe deformity  POST-OPERATIVE DIAGNOSIS: Same  Procedure(s): 1.  Right Lapidus bunion correction   2.  Right second metatarsal Weil osteotomy   3.  Right second hammertoe correction   4.  Right foot AP, lateral and oblique radiographs  SURGEON:  Wylene Simmer, MD  ASSISTANT: Mechele Claude, PA-C  ANESTHESIA:   General, regional  EBL:  minimal   TOURNIQUET:   Total Tourniquet Time Documented: Thigh (Right) - 55 minutes Total: Thigh (Right) - 55 minutes  COMPLICATIONS:  None apparent  DISPOSITION:  Extubated, awake and stable to recovery.  INDICATION FOR PROCEDURE: The patient is a 63 year old female with a long history of right forefoot pain due to a prominent bunion deformity as well as metatarsalgia and a second hammertoe.  She has failed nonoperative treatment to date including activity modification, oral anti-inflammatories and shoewear modification.  She presents now for operative treatment of these painful forefoot conditions.  The risks and benefits of the alternative treatment options have been discussed in detail.  The patient wishes to proceed with surgery and specifically understands risks of bleeding, infection, nerve damage, blood clots, need for additional surgery, amputation and death.  PROCEDURE IN DETAIL:  After pre operative consent was obtained, and the correct operative site was identified, the patient was brought to the operating room and placed supine on the OR table.  Anesthesia was administered.  Pre-operative antibiotics were administered.  A surgical timeout was taken.  The right lower extremity was prepped and draped in standard sterile fashion with a tourniquet around the thigh.  The extremity was elevated and tourniquet was inflated to 250 mmHg.  A longitudinal  incision was then made over the medial eminence.  Dissection was carried down through the skin and subcutaneous tissue.  The medial joint capsule was incised and elevated dorsally and plantarly.  Hypertrophic medial eminence was then resected in line with first metatarsal head with the oscillating saw.  Attention was then turned to the dorsum of the midfoot where a longitudinal incision was made over the first TMT joint.  Dissection was carried down through the subcutaneous tissues.  The extensor houses longus tendon was protected.  The joint capsule was incised and elevated.  The oscillating saw was then used to resect the articular cartilage and subchondral bone from both sides of the first TMT joint.  The cuts were beveled to take more bone laterally than medially in order to correct the intermetatarsal angle.  Waste bone was removed.  The joint was reduced and provisionally pinned.  AP and lateral radiographs confirmed appropriate correction of the hallux valgus and intermetatarsal angles.  The K wire was overdrilled.  A 4 mm partially-threaded cannulated screw was inserted and compressed the arthrodesis site appropriately.  A second K wire was then introduced from distal to proximal across the joint.  Radiographs confirmed appropriate alignment of the K wire.  It was overdrilled.  A 4 mm fully threaded cannulated screw was inserted.  Attention was then turned to the second MTP joint.  A longitudinal incision was made and dissection was carried down through the subcutaneous tissues.  The dorsal joint capsule was incised and the extensor tendons were lengthened.  The head of the metatarsal was exposed.  A Weil osteotomy was cut  with the oscillating saw all removing a small wedge of bone distally.  The head of the metatarsal was allowed to retract proximally and was fixed with a 2 mm Biomet FRS screw.  A transverse incision was made over the PIP joint.  Dissection was carried down through the subcutaneous  tissues and extensor mechanism.  The head of the proximal phalanx was resected followed by the base of the middle phalanx.  The joint was then reduced and fixed with a 3.4 mm Biomet VPC screw.  Final AP and lateral radiographs showed appropriate position and length of all hardware and appropriate correction of the bunion and second hammertoe deformities.  The wounds were irrigated copiously.  The extensor tendons were repaired with Vicryl.  The medial joint capsule was repaired with imbricating sutures of 2-0 Vicryl.  Subcutaneous tissues were then approximated with Monocryl.  Skin incisions were closed with nylon.  Sterile dressings were applied followed by a well-padded short leg splint.  The tourniquet was released after application of the dressings.  The patient was awakened from anesthesia and transported to the recovery room in stable condition.   FOLLOW UP PLAN: Nonweightbearing on the right lower extremity.  Follow-up in the office in 2 weeks for suture removal and conversion to a short leg cast with a toe spacer.  Aspirin for DVT prophylaxis.  RADIOGRAPHS: AP, lateral and oblique radiographs of the right foot are obtained intraoperatively.  These show interval correction of the bunion deformity with arthrodesis of the first TMT joint.  Interval Weil osteotomy is noted at the second metatarsal as well as hammertoe correction.  Hardware is appropriately positioned and of the appropriate lengths.    Mechele Claude PA-C was present and scrubbed for the duration of the operative case. His assistance was essential in positioning the patient, prepping and draping, gaining and maintaining exposure, performing the operation, closing and dressing the wounds and applying the splint.

## 2018-12-11 NOTE — Transfer of Care (Signed)
Immediate Anesthesia Transfer of Care Note  Patient: CURSTIN SCHMALE  Procedure(s) Performed: Right lapidus and modified Lauro Regulus; 2nd metatarsal Weil and possible hammertoe correction (Right Foot)  Patient Location: PACU  Anesthesia Type:GA combined with regional for post-op pain  Level of Consciousness: drowsy and patient cooperative  Airway & Oxygen Therapy: Patient Spontanous Breathing and Patient connected to nasal cannula oxygen  Post-op Assessment: Report given to RN and Post -op Vital signs reviewed and stable  Post vital signs: Reviewed and stable  Last Vitals:  Vitals Value Taken Time  BP 98/53 12/11/18 1017  Temp    Pulse 72 12/11/18 1018  Resp 10 12/11/18 1018  SpO2 100 % 12/11/18 1018  Vitals shown include unvalidated device data.  Last Pain:  Vitals:   12/11/18 0759  TempSrc: Oral  PainSc: 0-No pain         Complications: No apparent anesthesia complications

## 2018-12-11 NOTE — Anesthesia Procedure Notes (Signed)
Procedure Name: LMA Insertion Date/Time: 12/11/2018 8:58 AM Performed by: Signe Colt, CRNA Pre-anesthesia Checklist: Patient identified, Emergency Drugs available, Suction available and Patient being monitored Patient Re-evaluated:Patient Re-evaluated prior to induction Oxygen Delivery Method: Circle system utilized Preoxygenation: Pre-oxygenation with 100% oxygen Induction Type: IV induction Ventilation: Mask ventilation without difficulty LMA: LMA inserted LMA Size: 4.0 Number of attempts: 1 Airway Equipment and Method: Bite block Placement Confirmation: positive ETCO2 Tube secured with: Tape Dental Injury: Teeth and Oropharynx as per pre-operative assessment

## 2018-12-11 NOTE — Discharge Instructions (Addendum)
Sierra Simmer, MD [facility]  Please read the following information regarding your care after surgery.  Medications  You only need a prescription for the narcotic pain medicine (ex. oxycodone, Percocet, Norco).  All of the other medicines listed below are available over the counter. X Meloxicam that you have at home as prescribed for the first 3 days after surgery. X acetominophen (Tylenol) 650 mg every 4-6 hours as you need for minor to moderate pain X oxycodone as prescribed for severe pain  Narcotic pain medicine (ex. oxycodone, Percocet, Vicodin) will cause constipation.  To prevent this problem, take the following medicines while you are taking any pain medicine. X docusate sodium (Colace) 100 mg twice a day X senna (Senokot) 2 tablets twice a day  X To help prevent blood clots, take a baby aspirin (81 mg) twice a day after surgery.  You should also get up every hour while you are awake to move around.    Weight Bearing X Do not bear any weight on the operated leg or foot.  Cast / Splint / Dressing X Keep your splint, cast or dressing clean and dry.  Dont put anything (coat hanger, pencil, etc) down inside of it.  If it gets damp, use a hair dryer on the cool setting to dry it.  If it gets soaked, call the office to schedule an appointment for a cast change.   After your dressing, cast or splint is removed; you may shower, but do not soak or scrub the wound.  Allow the water to run over it, and then gently pat it dry.  Swelling It is normal for you to have swelling where you had surgery.  To reduce swelling and pain, keep your toes above your nose for at least 3 days after surgery.  It may be necessary to keep your foot or leg elevated for several weeks.  If it hurts, it should be elevated.  Follow Up Call my office at [phone] when you are discharged from the hospital or surgery center to schedule an appointment to be seen two weeks after surgery.  Call my office at  [phone] if you develop a fever >101.5 F, nausea, vomiting, bleeding from the surgical site or severe pain.       Post Anesthesia Home Care Instructions  Activity: Get plenty of rest for the remainder of the day. A responsible individual must stay with you for 24 hours following the procedure.  For the next 24 hours, DO NOT: -Drive a car -Paediatric nurse -Drink alcoholic beverages -Take any medication unless instructed by your physician -Make any legal decisions or sign important papers.  Meals: Start with liquid foods such as gelatin or soup. Progress to regular foods as tolerated. Avoid greasy, spicy, heavy foods. If nausea and/or vomiting occur, drink only clear liquids until the nausea and/or vomiting subsides. Call your physician if vomiting continues.  Special Instructions/Symptoms: Your throat may feel dry or sore from the anesthesia or the breathing tube placed in your throat during surgery. If this causes discomfort, gargle with warm salt water. The discomfort should disappear within 24 hours.  If you had a scopolamine patch placed behind your ear for the management of post- operative nausea and/or vomiting:  1. The medication in the patch is effective for 72 hours, after which it should be removed.  Wrap patch in a tissue and discard in the trash. Wash hands thoroughly with soap and water. 2. You may remove the patch earlier than 72 hours if you  experience unpleasant side effects which may include dry mouth, dizziness or visual disturbances. 3. Avoid touching the patch. Wash your hands with soap and water after contact with the patch.     Regional Anesthesia Blocks  1. Numbness or the inability to move the "blocked" extremity may last from 3-48 hours after placement. The length of time depends on the medication injected and your individual response to the medication. If the numbness is not going away after 48 hours, call your surgeon.  2. The extremity that is  blocked will need to be protected until the numbness is gone and the  Strength has returned. Because you cannot feel it, you will need to take extra care to avoid injury. Because it may be weak, you may have difficulty moving it or using it. You may not know what position it is in without looking at it while the block is in effect.  3. For blocks in the legs and feet, returning to weight bearing and walking needs to be done carefully. You will need to wait until the numbness is entirely gone and the strength has returned. You should be able to move your leg and foot normally before you try and bear weight or walk. You will need someone to be with you when you first try to ensure you do not fall and possibly risk injury.  4. Bruising and tenderness at the needle site are common side effects and will resolve in a few days.  5. Persistent numbness or new problems with movement should be communicated to the surgeon or the [facility] [phone] [place] [phone]).

## 2018-12-11 NOTE — Anesthesia Postprocedure Evaluation (Signed)
Anesthesia Post Note  Patient: Sierra [name]  Procedure(s) Performed: Right lapidus and modified McBride; 2nd metatarsal Weil and possible hammertoe correction (Right Foot)     Patient location during evaluation: PACU Anesthesia Type: General Level of consciousness: awake and alert Pain management: pain level controlled Vital Signs Assessment: post-procedure vital signs reviewed and stable Respiratory status: spontaneous breathing, nonlabored ventilation, respiratory function stable and patient connected to nasal cannula oxygen Cardiovascular status: blood pressure returned to baseline and stable Postop Assessment: no apparent nausea or vomiting Anesthetic complications: no    Last Vitals:  Vitals:   12/11/18 1045 12/11/18 1139  BP: 135/66 123/64  Pulse: 69 69  Resp: 13 18  Temp:  36.6 C  SpO2: 100% 99%    Last Pain:  Vitals:   12/11/18 1139  TempSrc:   PainSc: 0-No pain                 Catalina Gravel

## 2018-12-11 NOTE — Anesthesia Preprocedure Evaluation (Signed)
Anesthesia Evaluation  Patient identified by MRN, date of birth, ID band Patient awake  General Assessment Comment:Tracheobronchopathica Osteochrondroplastica  Reviewed: Allergy & Precautions, NPO status , Patient's Chart, lab work & pertinent test results  Airway Mallampati: II  TM Distance: >3 FB Neck ROM: Full    Dental  (+) Teeth Intact, Dental Advisory Given   Pulmonary neg pulmonary ROS,    Pulmonary exam normal breath sounds clear to auscultation       Cardiovascular negative cardio ROS Normal cardiovascular exam Rhythm:Regular Rate:Normal     Neuro/Psych Meniere's disease    GI/Hepatic negative GI ROS, Neg liver ROS,   Endo/Other  negative endocrine ROS  Renal/GU negative Renal ROS     Musculoskeletal METATARSALGIA   Abdominal   Peds  Hematology negative hematology ROS (+)   Anesthesia Other Findings Day of surgery medications reviewed with the patient.  Reproductive/Obstetrics                             Anesthesia Physical Anesthesia Plan  ASA: II  Anesthesia Plan: General   Post-op Pain Management:  Regional for Post-op pain   Induction: Intravenous  PONV Risk Score and Plan: 3 and Midazolam, Dexamethasone and Ondansetron  Airway Management Planned: LMA  Additional Equipment:   Intra-op Plan:   Post-operative Plan: Extubation in OR  Informed Consent: I have reviewed the patients History and Physical, chart, labs and discussed the procedure including the risks, benefits and alternatives for the proposed anesthesia with the patient or authorized representative who has indicated his/her understanding and acceptance.     Dental advisory given  Plan Discussed with: CRNA  Anesthesia Plan Comments:         Anesthesia Quick Evaluation

## 2018-12-11 NOTE — H&P (Signed)
Sierra [name] is an 63 y.o. female.   Chief Complaint: Right foot pain HPI: The patient is a 63 year old female without significant past medical history.  She has a long history of right forefoot pain due to a prominent bunion deformity and transfer metatarsalgia of the second MTP joint with a hammertoe..  She has failed nonoperative treatment to date including activity modification, oral anti-inflammatories and shoewear modification.  Past Medical History:  Diagnosis Date  . History of Doppler ultrasound    leg negative  2010 right  . History of Doppler ultrasound    right leg 2010 neg  . Meniere's disease    on diuretic therapy  . Normal nuclear stress test    Myoview 2006   . Tracheobronchopathia-osteochondroplastica 02/2010   involvement of trachea per FOB    Past Surgical History:  Procedure Laterality Date  . BRONCHOSCOPY  10/11  . DOPPLER ECHOCARDIOGRAPHY  2010   rt leg neg  . KNEE ARTHROSCOPY  2003   right  . KNEE ARTHROSCOPY  05/11/2011   Procedure: ARTHROSCOPY KNEE;  Surgeon: Lorn Junes, MD;  Location: [place];  Service: Orthopedics;  Laterality: Right;  with lateral meniscectomy  . myoview  2006   stress- neg  . SKIN GRAFT  1963    Family History  Problem Relation Age of Onset  . Uterine cancer Mother        sister  . Asthma Father   . Hyperlipidemia Father   . Macular degeneration Father   . Allergies Father   . Prostate cancer Father   . Multiple sclerosis Sister   . Uterine cancer Sister   . Thyroid cancer Daughter    Social History:  reports that she has never smoked. She has never used smokeless tobacco. She reports current alcohol use. She reports that she does not use drugs.  Allergies:  Allergies  Allergen Reactions  . Morphine Hives    N/V  . Tributyl Phosphate Hives  . Latex Rash    Medications Prior to Admission  Medication Sig Dispense Refill  . triamterene-hydrochlorothiazide (MAXZIDE-25) 37.5-25 MG per  tablet Take 1 tablet by mouth. Every other day. Takes for Meniere's disease    . estradiol (ESTRACE) 0.1 MG/GM vaginal cream Place 2 g vaginally daily. Using 2-3 times weekly    . KLOR-CON M15 15 MEQ tablet TAKE 1 TABLET (15 MEQ TOTAL) BY MOUTH 2 (TWO) TIMES DAILY. 60 tablet 0  . meloxicam (MOBIC) 15 MG tablet Take 1 tablet (15 mg total) by mouth daily. 30 tablet 0    No results found for this or any previous visit (from the past 48 hour(s)). No results found.  ROS no recent fever, chills, nausea, vomiting or changes in her appetite  Blood pressure 113/78, pulse (!) 57, temperature 98.1 F (36.7 C), temperature source Oral, resp. rate 16, height 5\' 4"  (1.626 m), weight 69.6 kg, SpO2 100 %. Physical Exam  Well-nourished well-developed woman in no apparent distress.  Alert and oriented x4.  Mood and affect are normal.  Extraocular motions are intact.  Respirations are unlabored.  Gait is normal.  The right foot has a prominent bunion deformity as well as a second hammertoe deformity.  Skin is healthy and intact.  Pulses are palpable.  No lymphadenopathy.  Normal sensibility to light touch dorsally and plantarly at the forefoot.  Assessment/Plan Right foot bunion deformity, metatarsalgia and second hammertoe -to the operating room today for Lapidus bunion correction, second metatarsal Weil osteotomy and hammertoe  correction.  The risks and benefits of the alternative treatment options have been discussed in detail.  The patient wishes to proceed with surgery and specifically understands risks of bleeding, infection, nerve damage, blood clots, need for additional surgery, amputation and death.   Wylene Simmer, MD 12/28/2018, 8:32 AM

## 2018-12-11 NOTE — Anesthesia Procedure Notes (Signed)
Anesthesia Regional Block: Adductor canal block   Pre-Anesthetic Checklist: ,, timeout performed, Correct Patient, Correct Site, Correct Laterality, Correct Procedure, Correct Position, site marked, Risks and benefits discussed,  Surgical consent,  Pre-op evaluation,  At surgeon's request and post-op pain management  Laterality: Right  Prep: chloraprep       Needles:  Injection technique: Single-shot  Needle Type: Echogenic Needle     Needle Length: 9cm  Needle Gauge: 21     Additional Needles:   Procedures:,,,, ultrasound used (permanent image in chart),,,,  Narrative:  Start time: 12/11/2018 8:35 AM End time: 12/11/2018 8:40 AM Injection made incrementally with aspirations every 5 mL.  Performed by: Personally  Anesthesiologist: Catalina Gravel, MD  Additional Notes: No pain on injection. No increased resistance to injection. Injection made in 5cc increments.  Good needle visualization.  Patient tolerated procedure well.

## 2018-12-11 NOTE — Progress Notes (Signed)
Assisted Dr. Turk with right, ultrasound guided, popliteal/saphenous block. Side rails up, monitors on throughout procedure. See vital signs in flow sheet. Tolerated Procedure well. 

## 2018-12-11 NOTE — Anesthesia Procedure Notes (Signed)
Anesthesia Regional Block: Popliteal block   Pre-Anesthetic Checklist: ,, timeout performed, Correct Patient, Correct Site, Correct Laterality, Correct Procedure, Correct Position, site marked, Risks and benefits discussed,  Surgical consent,  Pre-op evaluation,  At surgeon's request and post-op pain management  Laterality: Right  Prep: chloraprep       Needles:  Injection technique: Single-shot  Needle Type: Echogenic Needle     Needle Length: 9cm  Needle Gauge: 21     Additional Needles:   Procedures:,,,, ultrasound used (permanent image in chart),,,,  Narrative:  Start time: 12/11/2018 8:25 AM End time: 12/11/2018 8:35 AM Injection made incrementally with aspirations every 5 mL.  Performed by: Personally  Anesthesiologist: Catalina Gravel, MD  Additional Notes: No pain on injection. No increased resistance to injection. Injection made in 5cc increments.  Good needle visualization.  Patient tolerated procedure well.

## 2018-12-12 ENCOUNTER — Encounter (HOSPITAL_BASED_OUTPATIENT_CLINIC_OR_DEPARTMENT_OTHER): Payer: Self-pay | Admitting: Orthopedic Surgery

## 2018-12-16 ENCOUNTER — Encounter (HOSPITAL_BASED_OUTPATIENT_CLINIC_OR_DEPARTMENT_OTHER): Payer: Self-pay | Admitting: Orthopedic Surgery

## 2018-12-19 ENCOUNTER — Other Ambulatory Visit: Payer: Self-pay | Admitting: Family Medicine

## 2019-02-25 ENCOUNTER — Encounter: Payer: Self-pay | Admitting: Internal Medicine

## 2019-02-25 ENCOUNTER — Telehealth (INDEPENDENT_AMBULATORY_CARE_PROVIDER_SITE_OTHER): Payer: 59 | Admitting: Internal Medicine

## 2019-02-25 ENCOUNTER — Other Ambulatory Visit: Payer: Self-pay

## 2019-02-25 DIAGNOSIS — H1032 Unspecified acute conjunctivitis, left eye: Secondary | ICD-10-CM

## 2019-02-25 MED ORDER — POLYMYXIN B-TRIMETHOPRIM 10000-0.1 UNIT/ML-% OP SOLN: 1.0000 [drp] | Freq: Four times a day (QID) | OPHTHALMIC | 0 refills | Status: DC

## 2019-02-25 NOTE — Progress Notes (Signed)
Virtual Visit via Video Note  I connected with@ on 02/25/19 at 11:30 AM EDT by a video enabled telemedicine application and verified that I am speaking with the correct person using two identifiers. Location patient: home Location provider:work  office Persons participating in the virtual visit: patient, provider  [place] national recommendations  regarding COVID 19 pandemic   video visit is advised over in office visit for this patient.  Patient aware  of the limitations of evaluation and management by telemedicine and  availability of in person appointments. and agreed to proceed.   HPI: Sierra [name] presents for video visit for  Possible pink eye  Onset 1 day of  Left eye irritation itching and then redness  And clearer dc  She used some left over drops of ofloxin given to her for similar sx when she was in [place]   In remote past.    No exposures but her grandchild in day care who hasn't had pink eye  s no contacts eye pain change in vision  No fever  But was extra tired the day before    No uri sx  ROS: See pertinent positives and negatives per HPI.  Past Medical History:  Diagnosis Date  . History of Doppler ultrasound    leg negative  2010 right  . History of Doppler ultrasound    right leg 2010 neg  . Meniere's disease    on diuretic therapy  . Normal nuclear stress test    Myoview 2006   . Tracheobronchopathia-osteochondroplastica 02/2010   involvement of trachea per FOB    Past Surgical History:  Procedure Laterality Date  . BRONCHOSCOPY  10/11  . BUNIONECTOMY WITH WEIL OSTEOTOMY Right 12/11/2018   Procedure: Right lapidus and modified McBride; 2nd metatarsal Weil and hammertoe correction;  Surgeon: Wylene Simmer, MD;  Location: [facility];  Service: Orthopedics;  Laterality: Right;  69min  . DOPPLER ECHOCARDIOGRAPHY  2010   rt leg neg  . KNEE ARTHROSCOPY  2003   right  . KNEE ARTHROSCOPY  05/11/2011   Procedure: ARTHROSCOPY KNEE;  Surgeon:  Lorn Junes, MD;  Location: [facility];  Service: Orthopedics;  Laterality: Right;  with lateral meniscectomy  . myoview  2006   stress- neg  . SKIN GRAFT  1963    Family History  Problem Relation Age of Onset  . Uterine cancer Mother        sister  . Asthma Father   . Hyperlipidemia Father   . Macular degeneration Father   . Allergies Father   . Prostate cancer Father   . Multiple sclerosis Sister   . Uterine cancer Sister   . Thyroid cancer Daughter     Social History   Tobacco Use  . Smoking status: Never Smoker  . Smokeless tobacco: Never Used  Substance Use Topics  . Alcohol use: Yes    Alcohol/week: 0.0 standard drinks    Comment: socially 1-2 a month  . Drug use: No      Current Outpatient Medications:  .  aspirin EC 81 MG tablet, Take 1 tablet (81 mg total) by mouth 2 (two) times daily., Disp: 84 tablet, Rfl: 0 .  docusate sodium (COLACE) 100 MG capsule, Take 1 capsule (100 mg total) by mouth 2 (two) times daily. While taking narcotic pain medicine., Disp: 30 capsule, Rfl: 0 .  estradiol (ESTRACE) 0.1 MG/GM vaginal cream, Place 2 g vaginally daily. Using 2-3 times weekly, Disp: , Rfl:  .  KLOR-CON M15 15 MEQ tablet, TAKE 1 TABLET (15 MEQ TOTAL) BY MOUTH 2 (TWO) TIMES DAILY., Disp: 60 tablet, Rfl: 0 .  meloxicam (MOBIC) 15 MG tablet, TAKE 1 TABLET BY MOUTH EVERY DAY, Disp: 30 tablet, Rfl: 0 .  promethazine (PHENERGAN) 12.5 MG tablet, Take 1 tablet (12.5 mg total) by mouth every 6 (six) hours as needed for nausea or vomiting., Disp: 30 tablet, Rfl: 0 .  senna (SENOKOT) 8.6 MG TABS tablet, Take 2 tablets (17.2 mg total) by mouth 2 (two) times daily., Disp: 30 tablet, Rfl: 0 .  triamterene-hydrochlorothiazide (MAXZIDE-25) 37.5-25 MG per tablet, Take 1 tablet by mouth. Every other day. Takes for Meniere's disease, Disp: , Rfl:  .  trimethoprim-polymyxin b (POLYTRIM) ophthalmic solution, Place 1 drop into the left eye every 6 (six) hours. While awake,  Disp: 10 mL, Rfl: 0  EXAM: BP Readings from Last 3 Encounters:  12/11/18 123/64  08/04/18 104/76  07/14/18 112/70    VITALS per patient if applicable:  GENERAL: alert, oriented, appears well and in no acute distress  HEENT: atraumatic, left eye  Upper lid mild erythema and swell  And conjunctiva 1+ pink  No obvious   drusting  Medial some redness also  no obvious abnormalities on inspection of external nose and ears  NECK: normal movements of the head and neck LUNGS: on inspection no signs of respiratory distress, breathing rate appears normal, no obvious gross SOB, gasping or wheezing CV: no obvious cyanosis  PSYCH/NEURO: pleasant and cooperative, no obvious depression or anxiety, speech and thought processing grossly intact   ASSESSMENT AND PLAN:  Discussed the following assessment and plan:    ICD-10-CM   1. Acute conjunctivitis of left eye, unspecified acute conjunctivitis type  H10.32    Empiric rx bacteria but diff dx and course discussed   Compresses and drops  And fu iwht alarm sx or if not better  Can use in other eye if needed  Counseled.   Expectant management and discussion of plan and treatment with opportunity to ask questions and all were answered. The patient agreed with the plan and demonstrated an understanding of the instructions.   Advised to call back or seek an in-person evaluation if worsening  or having  further concerns .    Shanon Ace, MD

## 2019-06-02 LAB — HM MAMMOGRAPHY

## 2019-06-05 ENCOUNTER — Ambulatory Visit: Payer: 59 | Attending: Internal Medicine

## 2019-06-05 DIAGNOSIS — Z20822 Contact with and (suspected) exposure to covid-19: Secondary | ICD-10-CM

## 2019-06-07 LAB — NOVEL CORONAVIRUS, NAA: SARS-CoV-2, NAA: NOT DETECTED

## 2019-06-10 ENCOUNTER — Other Ambulatory Visit: Payer: 59

## 2019-06-11 ENCOUNTER — Encounter: Payer: Self-pay | Admitting: Internal Medicine

## 2019-06-11 ENCOUNTER — Ambulatory Visit: Payer: 59 | Attending: Internal Medicine

## 2019-06-11 DIAGNOSIS — Z20822 Contact with and (suspected) exposure to covid-19: Secondary | ICD-10-CM

## 2019-06-13 LAB — NOVEL CORONAVIRUS, NAA: SARS-CoV-2, NAA: NOT DETECTED

## 2019-07-15 ENCOUNTER — Ambulatory Visit: Payer: 59 | Attending: Internal Medicine

## 2019-07-15 DIAGNOSIS — Z20822 Contact with and (suspected) exposure to covid-19: Secondary | ICD-10-CM

## 2019-07-16 LAB — NOVEL CORONAVIRUS, NAA: SARS-CoV-2, NAA: NOT DETECTED

## 2019-08-21 ENCOUNTER — Ambulatory Visit: Payer: 59 | Attending: Internal Medicine

## 2019-08-21 DIAGNOSIS — Z20822 Contact with and (suspected) exposure to covid-19: Secondary | ICD-10-CM

## 2019-08-22 LAB — NOVEL CORONAVIRUS, NAA: SARS-CoV-2, NAA: NOT DETECTED

## 2019-08-22 LAB — SARS-COV-2, NAA 2 DAY TAT

## 2019-10-13 ENCOUNTER — Other Ambulatory Visit: Payer: Self-pay

## 2019-10-13 NOTE — Progress Notes (Signed)
This visit occurred during the SARS-CoV-2 public health emergency.  Safety protocols were in place, including screening questions prior to the visit, additional usage of staff PPE, and extensive cleaning of exam room while observing appropriate contact time as indicated for disinfecting solutions.    Chief Complaint  Patient presents with  . Fatigue    states that she has felt heart racing a few different times    HPI: Sierra [name] 64 y.o. come in for  Above  2 episodes   Coming off beach and up [place] and sudden palpitations and fast heart rate  140    upp 5 stairs      After groceries agin about 139   Lasted minutes  Felt off . But no lightheaded or syncope   hasd been doing some refular  exercise [place] and weights  And getting no exercise induced sx of improt  Resting 58 and  Has watch that records  Hr around 140 150 when does tjos  Off and on like a switch   Ok in [place] with potassium about once a week  Trying to prevent menieres  Dizzy attacks  gts leg cramps when takes med but ok otherwise   ROS: See pertinent positives and negatives per HPI. n exercise intolerance  fam hx neg scd arrythmia  Had cv eval about 2011 14   Past Medical History:  Diagnosis Date  . History of Doppler ultrasound    leg negative  2010 right  . History of Doppler ultrasound    right leg 2010 neg  . Meniere's disease    on diuretic therapy  . Normal nuclear stress test    Myoview 2006   . 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 Uterine cancer Mother        sister  . Asthma Father   . Hyperlipidemia Father   . Macular degeneration Father   . Allergies Father   . Prostate cancer Father   . Multiple sclerosis Sister   . Uterine cancer Sister   . Thyroid cancer Daughter     Social History   Socioeconomic History  . Marital status: Married    Spouse name: Not on  file  . Number of children: Not on file  . Years of education: Not on file  . Highest education level: Not on file  Occupational History  . Not on file  Tobacco Use  . Smoking status: Never Smoker  . Smokeless tobacco: Never Used  Substance and Sexual Activity  . Alcohol use: Yes    Alcohol/week: 0.0 standard drinks    Comment: socially 1-2 a month  . Drug use: No  . Sexual activity: Yes    Birth control/protection: Post-menopausal  Other Topics Concern  . Not on file  Social History Narrative   Regular Exercise- Yes   Occupation: Lawyer   HH of 2   3 Children   Social Determinants of Health   Financial Resource Strain:   . Difficulty of Paying Living Expenses:   Food Insecurity:   . Worried About Charity fundraiser in the Last Year:   . Arboriculturist in the Last Year:   Transportation Needs:   . Film/video editor (Medical):   Marland Kitchen Lack of Transportation (Non-Medical):   Physical Activity:   . Days of Exercise per Week:   . Minutes of Exercise per Session:  Stress:   . Feeling of Stress :   Social Connections:   . Frequency of Communication with Friends and Family:   . Frequency of Social Gatherings with Friends and Family:   . Attends Religious Services:   . Active Member of Clubs or Organizations:   . Attends Archivist Meetings:   Marland Kitchen Marital Status:     Outpatient Medications Prior to Visit  Medication Sig Dispense Refill  . estradiol (ESTRACE) 0.1 MG/GM vaginal cream Place 2 g vaginally daily. Using 2-3 times weekly    . KLOR-CON M15 15 MEQ tablet TAKE 1 TABLET (15 MEQ TOTAL) BY MOUTH 2 (TWO) TIMES DAILY. 60 tablet 0  . triamterene-hydrochlorothiazide (MAXZIDE-25) 37.5-25 MG per tablet Take 1 tablet by mouth. Every other day. Takes for Meniere's disease    . YUVAFEM 10 MCG TABS vaginal tablet Place 1 tablet vaginally 2 (two) times a week.    Marland Kitchen aspirin EC 81 MG tablet Take 1 tablet (81 mg total) by mouth 2 (two) times daily. (Patient not taking:  Reported on 10/14/2019) 84 tablet 0  . docusate sodium (COLACE) 100 MG capsule Take 1 capsule (100 mg total) by mouth 2 (two) times daily. While taking narcotic pain medicine. (Patient not taking: Reported on 10/14/2019) 30 capsule 0  . meloxicam (MOBIC) 15 MG tablet TAKE 1 TABLET BY MOUTH EVERY DAY (Patient not taking: Reported on 10/14/2019) 30 tablet 0  . potassium chloride SA (KLOR-CON M15) 15 MEQ tablet Klor-Con M15 mEq tablet,extended release    . promethazine (PHENERGAN) 12.5 MG tablet Take 1 tablet (12.5 mg total) by mouth every 6 (six) hours as needed for nausea or vomiting. (Patient not taking: Reported on 10/14/2019) 30 tablet 0  . senna (SENOKOT) 8.6 MG TABS tablet Take 2 tablets (17.2 mg total) by mouth 2 (two) times daily. (Patient not taking: Reported on 10/14/2019) 30 tablet 0  . trimethoprim-polymyxin b (POLYTRIM) ophthalmic solution Place 1 drop into the left eye every 6 (six) hours. While awake (Patient not taking: Reported on 10/14/2019) 10 mL 0   No facility-administered medications prior to visit.     EXAM:  BP 116/72   Pulse 63   Temp 97.6 F (36.4 C) (Temporal)   Ht 5\' 5"  (1.651 m) Comment: Patient gave me a verbal height  SpO2 97%   BMI 25.53 kg/m   Body mass index is 25.53 kg/m.  GENERAL: vitals reviewed and listed above, alert, oriented, appears well hydrated and in no acute distress HEENT: atraumatic, conjunctiva  clear, no obvious abnormalities on inspection of external nose and ears OP masked NECK: no obvious masses on inspection palpation  LUNGS: clear to auscultation bilaterally, no wheezes, rales or rhonchi, good air movement CV: HRRR, no clubbing cyanosis or  peripheral edema nl cap refill  Abdomen:  Sof,t normal bowel sounds without hepatosplenomegaly, no guarding rebound or masses no CVA tenderness MS: moves all extremities without noticeable focal  abnormality PSYCH: pleasant and cooperative, no obvious depression or anxiety Lab Results  Component  Value Date   WBC 5.8 10/14/2019   HGB 13.3 10/14/2019   HCT 39.3 10/14/2019   PLT 239.0 10/14/2019   GLUCOSE 86 10/14/2019   CHOL 239 (H) 10/14/2019   TRIG 95.0 10/14/2019   HDL 87.30 10/14/2019   LDLDIRECT 112.2 08/22/2012   LDLCALC 132 (H) 10/14/2019   ALT 24 10/14/2019   AST 23 10/14/2019   NA 141 10/14/2019   K 4.3 10/14/2019   CL 104 10/14/2019   CREATININE 0.69  10/14/2019   BUN 18 10/14/2019   CO2 29 10/14/2019   TSH 1.41 10/14/2019   INR 1.2 (H) 09/21/2010   HGBA1C 6.0 12/03/2017   BP Readings from Last 3 Encounters:  10/14/19 116/72  12/11/18 123/64  08/04/18 104/76  EKG SR  Read as borderline t twave inv on precordial but in v2 may be lead placement    intervalls nl   ASSESSMENT AND PLAN:  Discussed the following assessment and plan:  Tachycardia - Plan: Basic metabolic panel, CBC with Differential/Platelet, Hepatic function panel, TSH, T4, free, T3, free, Lipid panel, EKG 12-Lead, Magnesium  Rapid palpitations - Plan: Magnesium  Meniere's disease, unspecified laterality - Plan: Basic metabolic panel, CBC with Differential/Platelet, Hepatic function panel, TSH, T4, free, T3, free, Lipid panel, Magnesium  Medication management - Plan: Magnesium Sounds like svt  Possible has watch  gets rate but not EKG tracing .  Lab today  Refer back to cardiology  consdier more monitoring eval disc  -Patient advised to return or notify health care team  if  new concerns arise.  Patient Instructions  Lab today  Sounds like svt or such a rhythym issue. Plan cardiology   Monitoring and  Poss echo updated may be advised .   Sometimes medications  Such as beta blockers advised    Supraventricular Tachycardia, Adult Supraventricular tachycardia (SVT) is a kind of abnormal heartbeat. It makes your heart beat very fast and then beat at a normal speed. A normal resting heartbeat is 60-100 times a minute. This condition can make your heart beat more than 150 times a minute. Times  of having a fast heartbeat (episodes) can be scary, but they are usually not dangerous. In some cases, they may lead to heart failure if:  They happen many times per day.  Last longer than a few seconds. What are the causes?  A normal heartbeat starts when an area called the sinoatrial node sends out an electrical signal. In SVT, other areas of the heart send out signals that get in the way of the signal from the sinoatrial node. What increases the risk? You are more likely to develop this condition if you are:  37-4 years old.  A woman. The following factors may make you more likely to develop this condition:  Stress.  Tiredness.  Smoking.  Stimulant drugs, such as cocaine and methamphetamine.  Alcohol.  Caffeine.  Pregnancy.  Feeling worried or nervous (anxiety). What are the signs or symptoms?  A pounding heart.  A feeling that your heart is skipping beats (palpitations).  Weakness.  Trouble getting enough air.  Pain or tightness in your chest.  Feeling like you are going to pass out (faint).  Feeling worried or nervous.  Dizziness.  Sweating.  Feeling sick to your stomach (nausea).  Passing out.  Tiredness. Sometimes, there are no symptoms. How is this treated?  Vagal nerve stimulation. Ways to do this include: ? Holding your breath and pushing, as though you are pooping (having a bowel movement). ? Massaging an area on one side of your neck. Do not try this yourself. Only a doctor should do this. If done the wrong way, it can lead to a stroke. ? Bending forward with your head between your legs. ? Coughing while bending forward with your head between your legs. ? Closing your eyes and massaging your eyeballs. Ask a doctor how to do this.  Medicines that prevent attacks.  Medicine to stop an attack given through an IV tube at  the hospital.  A small electric shock (cardioversion) that stops an attack.  Radiofrequency ablation. In this  procedure, a small, thin tube (catheter) is used to send energy to the area that is causing the rapid heartbeats. If you do not have symptoms, you may not need treatment. Follow these instructions at home: Stress  Avoid things that make you feel stressed.  To deal with stress, try: ? Doing yoga or meditation, or being out in nature. ? Listening to relaxing music. ? Doing deep breathing. ? Taking steps to be healthy, such as getting lots of sleep, exercising, and eating a balanced diet. ? Talking with a mental health doctor. Lifestyle   Try to get at least 7 hours of sleep each night.  Do not use any products that contain nicotine or tobacco, such as cigarettes, e-cigarettes, and chewing tobacco. If you need help quitting, ask your doctor.  Be aware of how alcohol affects you. ? If alcohol gives you a fast heartbeat, do not drink alcohol. ? If alcohol does not seem to give you a fast heartbeat, limit alcohol use to no more than 1 drink a day for women who are not pregnant, and 2 drinks a day for men. In the [place] one drink is one of these:  12 oz of beer (355 mL).  5 oz of wine (148 mL).  1 oz of hard liquor (44 mL).  Be aware of how caffeine affects you. ? If caffeine gives you a fast heartbeat, do not eat, drink, or use anything with caffeine in it. ? If caffeine does not seem to give you a fast heartbeat, limit how much caffeine you eat, drink, or use.  Do not use stimulant drugs. If you need help quitting, ask your doctor. General instructions  Stay at a healthy weight.  Exercise regularly. Ask your doctor about good activities for you. Try one or a mixture of these: ? 150 minutes a week of gentle exercise, like walking or yoga. ? 75 minutes a week of exercise that is very active, like running or swimming.  Do vagus nerve treatments to slow down your heartbeat as told by your doctor.  Take over-the-counter and prescription medicines only as told by your  doctor.  Keep all follow-up visits as told by your doctor. This is important. Contact a doctor if:  You have a fast heartbeat more often.  Times of having a fast heartbeat last longer than before.  Home treatments to slow down your heartbeat do not help.  You have new symptoms. Get help right away if:  You have chest pain.  Your symptoms get worse.  You have trouble breathing.  Your heart beats very fast for more than 20 minutes.  You pass out. These symptoms may be an emergency. Do not wait to see if the symptoms will go away. Get medical help right away. Call your local emergency services (911 in the [place] Do not drive yourself to the hospital. Summary  SVT is a type of abnormal heart beat.  This condition can make your heart beat more than 150 times a minute.  Treatment depends on how often the condition happens and your symptoms. This information is not intended to replace advice given to you by your health care provider. Make sure you discuss any questions you have with your health care provider. Document Revised: 04/01/2018 Document Reviewed: 04/01/2018 Elsevier Patient Education  2020 [place] Kandy Towery M.D.

## 2019-10-14 ENCOUNTER — Encounter: Payer: Self-pay | Admitting: Internal Medicine

## 2019-10-14 ENCOUNTER — Ambulatory Visit (INDEPENDENT_AMBULATORY_CARE_PROVIDER_SITE_OTHER): Payer: 59 | Admitting: Internal Medicine

## 2019-10-14 VITALS — BP 116/72 | HR 63 | Temp 97.6°F | Ht 65.0 in

## 2019-10-14 DIAGNOSIS — R002 Palpitations: Secondary | ICD-10-CM | POA: Diagnosis not present

## 2019-10-14 DIAGNOSIS — H8109 Meniere's disease, unspecified ear: Secondary | ICD-10-CM

## 2019-10-14 DIAGNOSIS — R000 Tachycardia, unspecified: Secondary | ICD-10-CM | POA: Diagnosis not present

## 2019-10-14 DIAGNOSIS — Z79899 Other long term (current) drug therapy: Secondary | ICD-10-CM | POA: Diagnosis not present

## 2019-10-14 LAB — CBC WITH DIFFERENTIAL/PLATELET
Basophils Absolute: 0.1 10*3/uL (ref 0.0–0.1)
Basophils Relative: 1 % (ref 0.0–3.0)
Eosinophils Absolute: 0.2 10*3/uL (ref 0.0–0.7)
Eosinophils Relative: 4 % (ref 0.0–5.0)
HCT: 39.3 % (ref 36.0–46.0)
Hemoglobin: 13.3 g/dL (ref 12.0–15.0)
Lymphocytes Relative: 27.4 % (ref 12.0–46.0)
Lymphs Abs: 1.6 10*3/uL (ref 0.7–4.0)
MCHC: 33.9 g/dL (ref 30.0–36.0)
MCV: 90 fl (ref 78.0–100.0)
Monocytes Absolute: 0.5 10*3/uL (ref 0.1–1.0)
Monocytes Relative: 7.9 % (ref 3.0–12.0)
Neutro Abs: 3.5 10*3/uL (ref 1.4–7.7)
Neutrophils Relative %: 59.7 % (ref 43.0–77.0)
Platelets: 239 10*3/uL (ref 150.0–400.0)
RBC: 4.37 Mil/uL (ref 3.87–5.11)
RDW: 13.8 % (ref 11.5–15.5)
WBC: 5.8 10*3/uL (ref 4.0–10.5)

## 2019-10-14 LAB — BASIC METABOLIC PANEL
BUN: 18 mg/dL (ref 6–23)
CO2: 29 mEq/L (ref 19–32)
Calcium: 9.9 mg/dL (ref 8.4–10.5)
Chloride: 104 mEq/L (ref 96–112)
Creatinine, Ser: 0.69 mg/dL (ref 0.40–1.20)
GFR: 85.53 mL/min (ref >60.00)
Glucose, Bld: 86 mg/dL (ref 70–99)
Potassium: 4.3 mEq/L (ref 3.5–5.1)
Sodium: 141 mEq/L (ref 135–145)

## 2019-10-14 LAB — MAGNESIUM: Magnesium: 2.1 mg/dL (ref 1.5–2.5)

## 2019-10-14 LAB — HEPATIC FUNCTION PANEL
ALT: 24 U/L (ref 0–35)
AST: 23 U/L (ref 0–37)
Albumin: 4.4 g/dL (ref 3.5–5.2)
Alkaline Phosphatase: 72 U/L (ref 39–117)
Bilirubin, Direct: 0.1 mg/dL (ref 0.0–0.3)
Total Bilirubin: 0.4 mg/dL (ref 0.2–1.2)
Total Protein: 6.8 g/dL (ref 6.0–8.3)

## 2019-10-14 LAB — TSH: TSH: 1.41 u[IU]/mL (ref 0.35–4.50)

## 2019-10-14 LAB — T4, FREE: Free T4: 0.82 ng/dL (ref 0.60–1.60)

## 2019-10-14 LAB — LIPID PANEL
Cholesterol: 239 mg/dL — ABNORMAL HIGH (ref 0–200)
HDL: 87.3 mg/dL (ref >39.00)
LDL Cholesterol: 132 mg/dL — ABNORMAL HIGH (ref 0–99)
NonHDL: 151.41
Total CHOL/HDL Ratio: 3
Triglycerides: 95 mg/dL (ref 0.0–149.0)
VLDL: 19 mg/dL (ref 0.0–40.0)

## 2019-10-14 LAB — T3, FREE: T3, Free: 3.1 pg/mL (ref 2.3–4.2)

## 2019-10-14 NOTE — Patient Instructions (Signed)
Lab today  Sounds like svt or such a rhythym issue. Plan cardiology   Monitoring and  Poss echo updated may be advised .   Sometimes medications  Such as beta blockers advised    Supraventricular Tachycardia, Adult Supraventricular tachycardia (SVT) is a kind of abnormal heartbeat. It makes your heart beat very fast and then beat at a normal speed. A normal resting heartbeat is 60-100 times a minute. This condition can make your heart beat more than 150 times a minute. Times of having a fast heartbeat (episodes) can be scary, but they are usually not dangerous. In some cases, they may lead to heart failure if:  They happen many times per day.  Last longer than a few seconds. What are the causes?  A normal heartbeat starts when an area called the sinoatrial node sends out an electrical signal. In SVT, other areas of the heart send out signals that get in the way of the signal from the sinoatrial node. What increases the risk? You are more likely to develop this condition if you are:  83-41 years old.  A woman. The following factors may make you more likely to develop this condition:  Stress.  Tiredness.  Smoking.  Stimulant drugs, such as cocaine and methamphetamine.  Alcohol.  Caffeine.  Pregnancy.  Feeling worried or nervous (anxiety). What are the signs or symptoms?  A pounding heart.  A feeling that your heart is skipping beats (palpitations).  Weakness.  Trouble getting enough air.  Pain or tightness in your chest.  Feeling like you are going to pass out (faint).  Feeling worried or nervous.  Dizziness.  Sweating.  Feeling sick to your stomach (nausea).  Passing out.  Tiredness. Sometimes, there are no symptoms. How is this treated?  Vagal nerve stimulation. Ways to do this include: ? Holding your breath and pushing, as though you are pooping (having a bowel movement). ? Massaging an area on one side of your neck. Do not try this yourself.  Only a doctor should do this. If done the wrong way, it can lead to a stroke. ? Bending forward with your head between your legs. ? Coughing while bending forward with your head between your legs. ? Closing your eyes and massaging your eyeballs. Ask a doctor how to do this.  Medicines that prevent attacks.  Medicine to stop an attack given through an IV tube at the hospital.  A small electric shock (cardioversion) that stops an attack.  Radiofrequency ablation. In this procedure, a small, thin tube (catheter) is used to send energy to the area that is causing the rapid heartbeats. If you do not have symptoms, you may not need treatment. Follow these instructions at home: Stress  Avoid things that make you feel stressed.  To deal with stress, try: ? Doing yoga or meditation, or being out in nature. ? Listening to relaxing music. ? Doing deep breathing. ? Taking steps to be healthy, such as getting lots of sleep, exercising, and eating a balanced diet. ? Talking with a mental health doctor. Lifestyle   Try to get at least 7 hours of sleep each night.  Do not use any products that contain nicotine or tobacco, such as cigarettes, e-cigarettes, and chewing tobacco. If you need help quitting, ask your doctor.  Be aware of how alcohol affects you. ? If alcohol gives you a fast heartbeat, do not drink alcohol. ? If alcohol does not seem to give you a fast heartbeat, limit alcohol use to  no more than 1 drink a day for women who are not pregnant, and 2 drinks a day for men. In the [place] one drink is one of these:  12 oz of beer (355 mL).  5 oz of wine (148 mL).  1 oz of hard liquor (44 mL).  Be aware of how caffeine affects you. ? If caffeine gives you a fast heartbeat, do not eat, drink, or use anything with caffeine in it. ? If caffeine does not seem to give you a fast heartbeat, limit how much caffeine you eat, drink, or use.  Do not use stimulant drugs. If you need help  quitting, ask your doctor. General instructions  Stay at a healthy weight.  Exercise regularly. Ask your doctor about good activities for you. Try one or a mixture of these: ? 150 minutes a week of gentle exercise, like walking or yoga. ? 75 minutes a week of exercise that is very active, like running or swimming.  Do vagus nerve treatments to slow down your heartbeat as told by your doctor.  Take over-the-counter and prescription medicines only as told by your doctor.  Keep all follow-up visits as told by your doctor. This is important. Contact a doctor if:  You have a fast heartbeat more often.  Times of having a fast heartbeat last longer than before.  Home treatments to slow down your heartbeat do not help.  You have new symptoms. Get help right away if:  You have chest pain.  Your symptoms get worse.  You have trouble breathing.  Your heart beats very fast for more than 20 minutes.  You pass out. These symptoms may be an emergency. Do not wait to see if the symptoms will go away. Get medical help right away. Call your local emergency services (911 in the [place] Do not drive yourself to the hospital. Summary  SVT is a type of abnormal heart beat.  This condition can make your heart beat more than 150 times a minute.  Treatment depends on how often the condition happens and your symptoms. This information is not intended to replace advice given to you by your health care provider. Make sure you discuss any questions you have with your health care provider. Document Revised: 04/01/2018 Document Reviewed: 04/01/2018 Elsevier Patient Education  2020 [employer].

## 2019-10-15 ENCOUNTER — Other Ambulatory Visit: Payer: Self-pay

## 2019-10-15 ENCOUNTER — Encounter: Payer: Self-pay | Admitting: Internal Medicine

## 2019-10-15 DIAGNOSIS — R000 Tachycardia, unspecified: Secondary | ICD-10-CM

## 2019-10-15 NOTE — Telephone Encounter (Signed)
Disregard opened in error °

## 2019-10-15 NOTE — Progress Notes (Signed)
Blood results are normal   cholesterol up some from last check  We are doing referral to dr  Buford Dresser on dr Theodoro Doing recs.

## 2019-11-02 ENCOUNTER — Encounter: Payer: Self-pay | Admitting: *Deleted

## 2019-11-02 ENCOUNTER — Encounter: Payer: Self-pay | Admitting: Cardiology

## 2019-11-02 ENCOUNTER — Other Ambulatory Visit: Payer: Self-pay

## 2019-11-02 ENCOUNTER — Ambulatory Visit (INDEPENDENT_AMBULATORY_CARE_PROVIDER_SITE_OTHER): Payer: 59 | Admitting: Cardiology

## 2019-11-02 VITALS — BP 126/84 | Ht 65.0 in | Wt 163.2 lb

## 2019-11-02 DIAGNOSIS — R000 Tachycardia, unspecified: Secondary | ICD-10-CM | POA: Diagnosis not present

## 2019-11-02 DIAGNOSIS — Z7189 Other specified counseling: Secondary | ICD-10-CM

## 2019-11-02 DIAGNOSIS — R002 Palpitations: Secondary | ICD-10-CM | POA: Diagnosis not present

## 2019-11-02 NOTE — Progress Notes (Signed)
Patient ID: Sierra [name], female   DOB: April 18, 1956, 64 y.o.   MRN: [id number] Patient enrolled for [employer] to ship a 7 day ZIO XT long term holter monitor to her home.

## 2019-11-02 NOTE — Progress Notes (Signed)
Cardiology Office Note:    Date:  11/02/2019   ID:  Sierra [name], DOB 03-10-1956, MRN [id number]  PCP:  Burnis Medin, MD  Cardiologist:  Buford Dresser, MD  Referring MD: Burnis Medin, MD   CC: new patient consultation for palpitations/tachycardia  History of Present Illness:    Sierra [name] is a 64 y.o. female with a hx of Meneire's who is seen as a new consult at the request of Panosh, Standley Brooking, MD for the evaluation and management of tachycardia.  Office visit from 10/14/19 with Dr. Regis Bill reviewed. Per the note, she has had several episodes of fast heart rates and palpitations.  She notes that she had similar episodes several years ago, had an unclear workup which was normal. Had foot surgery, has worked back up to activity. Only noticed with carrying obejcts and going up steps/incline.  Tachycardia/palpitations: -Initial onset: Several weeks ago, carrying luggage up stairs, [place] items to the beach [place] incline. Noted on her apple watch that her heart rate is 140. Makes her stop and catch her breath. Feels like her heart is racing out of her chest. Takes a few minutes at most to resolve.  -Frequency/Duration: two-three major events, occasionally notices in a more minor way -Associated symptoms: shortness of breath, racing heart beat. -Aggravating/alleviating factors: carrying items up stairs/incline.  -Syncope/near syncope: none -Prior cardiac history: -Prior ECG: NSR -Prior workup: -Prior treatment: -Possible medication interactions: -Caffeine: latte in AM, 1-2 nespresso pods of coffee during the day -Alcohol: very rarely  -Tobacco: never -OTC supplements: none -Comorbidities: mildly elevated LDL at 132 (prior 112) -Exercise level: active, works out with [employer] -Labs: TSH, kidney function/electrolytes, CBC reviewed. -Cardiac ROS: no chest pain, no PND, no orthopnea, no LE edema. -Family history: mother having unclear heart issues, but she is in  her 33s. Denies family history of heart attack or stroke.  Past Medical History:  Diagnosis Date  . History of Doppler ultrasound    leg negative  2010 right  . History of Doppler ultrasound    right leg 2010 neg  . Meniere's disease    on diuretic therapy  . Normal nuclear stress test    Myoview 2006   . Tracheobronchopathia-osteochondroplastica 02/2010   involvement of trachea per FOB    Past Surgical History:  Procedure Laterality Date  . BRONCHOSCOPY  10/11  . BUNIONECTOMY WITH WEIL OSTEOTOMY Right 12/11/2018   Procedure: Right lapidus and modified McBride; 2nd metatarsal Weil and hammertoe correction;  Surgeon: Wylene Simmer, MD;  Location: [facility];  Service: Orthopedics;  Laterality: Right;  27min  . DOPPLER ECHOCARDIOGRAPHY  2010   rt leg neg  . KNEE ARTHROSCOPY  2003   right  . KNEE ARTHROSCOPY  05/11/2011   Procedure: ARTHROSCOPY KNEE;  Surgeon: Lorn Junes, MD;  Location: [place];  Service: Orthopedics;  Laterality: Right;  with lateral meniscectomy  . myoview  2006   stress- neg  . SKIN GRAFT  1963    Current Medications: Current Outpatient Medications on File Prior to Visit  Medication Sig  . estradiol (ESTRACE) 0.1 MG/GM vaginal cream Place 2 g vaginally daily. Using 2-3 times weekly  . KLOR-CON M15 15 MEQ tablet TAKE 1 TABLET (15 MEQ TOTAL) BY MOUTH 2 (TWO) TIMES DAILY.  Marland Kitchen potassium chloride SA (KLOR-CON M15) 15 MEQ tablet Klor-Con M15 mEq tablet,extended release  . triamterene-hydrochlorothiazide (MAXZIDE-25) 37.5-25 MG per tablet Take 1 tablet by mouth. Every other day. Takes for  Meniere's disease  . YUVAFEM 10 MCG TABS vaginal tablet Place 1 tablet vaginally 2 (two) times a week.   No current facility-administered medications on file prior to visit.     Allergies:   Morphine, Latex, Phosphoric acid, and Tributyl phosphate   Social History   Tobacco Use  . Smoking status: Never Smoker  . Smokeless tobacco: Never  Used  Substance Use Topics  . Alcohol use: Yes    Alcohol/week: 0.0 standard drinks    Comment: socially 1-2 a month  . Drug use: No    Family History: family history includes Allergies in her father; Asthma in her father; Hyperlipidemia in her father; Macular degeneration in her father; Multiple sclerosis in her sister; Prostate cancer in her father; Thyroid cancer in her daughter; Uterine cancer in her mother and sister.  ROS:   Please see the history of present illness.  Additional pertinent ROS: Constitutional: Negative for chills, fever, night sweats, unintentional weight loss  HENT: Negative for ear pain and hearing loss.   Eyes: Negative for loss of vision and eye pain.  Respiratory: Negative for cough, sputum, wheezing.   Cardiovascular: See HPI. Gastrointestinal: Negative for abdominal pain, melena, and hematochezia.  Genitourinary: Negative for dysuria and hematuria.  Musculoskeletal: Negative for falls and myalgias.  Skin: Negative for itching and rash.  Neurological: Negative for focal weakness, focal sensory changes and loss of consciousness.  Endo/Heme/Allergies: Does not bruise/bleed easily.     EKGs/Labs/Other Studies Reviewed:    The following studies were reviewed today: Monitor 02/28/2017  Sinus bradycardia to sinus tachycardia.  No arrhythmias.   Normal 30 day event monitor.  Echo 2014: - Left ventricle: The cavity size was normal. Wall thickness  was normal. Systolic function was normal. The estimated  ejection fraction was in the range of 55% to 60%. Wall  motion was normal; there were no regional wall motion  abnormalities. Left ventricular diastolic function  parameters were normal.  - Atrial septum: No defect or patent foramen ovale was  identified.  Echo stress 2011  EKG:  EKG is personally reviewed.  The ekg ordered today demonstrates sinus bradycardia  Recent Labs: 10/14/2019: ALT 24; BUN 18; Creatinine, Ser 0.69; Hemoglobin  13.3; Magnesium 2.1; Platelets 239.0; Potassium 4.3; Sodium 141; TSH 1.41  Recent Lipid Panel    Component Value Date/Time   CHOL 239 (H) 10/14/2019 1227   TRIG 95.0 10/14/2019 1227   HDL 87.30 10/14/2019 1227   CHOLHDL 3 10/14/2019 1227   VLDL 19.0 10/14/2019 1227   LDLCALC 132 (H) 10/14/2019 1227   LDLDIRECT 112.2 08/22/2012 1213    Physical Exam:    VS:  BP 126/84   Ht 5\' 5"  (1.651 m)   Wt 163 lb 3.2 oz (74 kg)   SpO2 98%   BMI 27.16 kg/m     Wt Readings from Last 3 Encounters:  11/02/19 163 lb 3.2 oz (74 kg)  12/11/18 153 lb 7 oz (69.6 kg)  08/04/18 155 lb (70.3 kg)    GEN: Well nourished, well developed in no acute distress HEENT: Normal, moist mucous membranes NECK: No JVD CARDIAC: regular rhythm, normal S1 and S2, no rubs or gallops. No murmurs. VASCULAR: Radial and DP pulses 2+ bilaterally. No carotid bruits RESPIRATORY:  Clear to auscultation without rales, wheezing or rhonchi  ABDOMEN: Soft, non-tender, non-distended MUSCULOSKELETAL:  Ambulates independently SKIN: Warm and dry, no edema NEUROLOGIC:  Alert and oriented x 3. No focal neuro deficits noted. PSYCHIATRIC:  Normal affect  ASSESSMENT:    1. Tachycardia   2. Heart palpitations   3. Counseling on health promotion and disease prevention   4. Cardiac risk counseling    PLAN:    Palpitations/tachycardia:  -14 day zio -if abnormal, echo and treadmill -discussed red flag warning signs that need immediate medical attention. -discussed that sinus tachycardia is usually secondary to another trigger and typically does not need to be treated.  Cardiac risk counseling and prevention recommendations: -recommend heart healthy/Mediterranean diet, with whole grains, fruits, vegetable, fish, lean meats, nuts, and olive oil. Limit salt. -recommend moderate walking, 3-5 times/week for 30-50 minutes each session. Aim for at least 150 minutes.week. Goal should be pace of 3 miles/hours, or walking 1.5 miles in 30  minutes -recommend avoidance of tobacco products. Avoid excess alcohol.  -ASCVD risk score: The 10-year ASCVD risk score Mikey Bussing DC Brooke Bonito., et al., 2013) is: 4.3%   Values used to calculate the score:     Age: 42 years     Sex: Female     Is Non-Hispanic African American: No     Diabetic: No     Tobacco smoker: No     Systolic Blood Pressure: 993 mmHg     Is BP treated: No     HDL Cholesterol: 87.3 mg/dL     Total Cholesterol: 239 mg/dL    Plan for follow up: 6 weeks  Buford Dresser, MD, PhD [place]  [facility]    Medication Adjustments/Labs and Tests Ordered: Current medicines are reviewed at length with the patient today.  Concerns regarding medicines are outlined above.  Orders Placed This Encounter  Procedures  . LONG TERM MONITOR (3-14 DAYS)  . EKG 12-Lead   No orders of the defined types were placed in this encounter.   Patient Instructions  Medication Instructions:  Your Physician recommend you continue on your current medication as directed.    *If you need a refill on your cardiac medications before your next appointment, please call your pharmacy*   Lab Work: None   Testing/Procedures: Our physician has recommended that you wear an  [place] monitor. The Zio patch cardiac monitor continuously records heart rhythm data for up to 14 days, this is for patients being evaluated for multiple types heart rhythms. For the first 24 hours post application, please avoid getting the Zio monitor wet in the shower or by excessive sweating during exercise. After that, feel free to carry on with regular activities. Keep soaps and lotions away from the ZIO XT Patch.   Someone from our office will call to mail monitor     Follow-Up: At [facility], you and your health needs are our priority.  As part of our continuing mission to provide you with exceptional heart care, we have created designated Provider Care Teams.  These Care Teams include your primary  Cardiologist (physician) and Advanced Practice Providers (APPs -  Physician Assistants and Nurse Practitioners) who all work together to provide you with the care you need, when you need it.  We recommend signing up for the patient portal called "MyChart".  Sign up information is provided on this After Visit Summary.  MyChart is used to connect with patients for Virtual Visits (Telemedicine).  Patients are able to view lab/test results, encounter notes, upcoming appointments, etc.  Non-urgent messages can be sent to your provider as well.   To learn more about what you can do with MyChart, go to [email or web address].    Your next appointment:  6 week(s)  The format for your next appointment:   In Person  Provider:   Buford Dresser, MD   [place] Instructions   Your physician has requested you wear your ZIO patch monitor___14____days.   This is a single patch monitor.  [employer] supplies one patch monitor per enrollment.  Additional stickers are not available.   Please do not apply patch if you will be having a Nuclear Stress Test, Echocardiogram, Cardiac CT, MRI, or Chest Xray during the time frame you would be wearing the monitor. The patch cannot be worn during these tests.  You cannot remove and re-apply the ZIO XT patch monitor.   Your ZIO patch monitor will be sent USPS Priority mail from [facility] directly to your home address. The monitor may also be mailed to a PO BOX if home delivery is not available.   It may take 3-5 days to receive your monitor after you have been enrolled.   Once you have received you monitor, please review enclosed instructions.  Your monitor has already been registered assigning a specific monitor serial # to you.   Applying the monitor   Shave hair from upper left chest.   Hold abrader disc by orange tab.  Rub abrader in 40 strokes over left upper chest as indicated in your monitor instructions.   Clean area with 4  enclosed alcohol pads .  Use all pads to assure are is cleaned thoroughly.  Let dry.   Apply patch as indicated in monitor instructions.  Patch will be place under collarbone on left side of chest with arrow pointing upward.   Rub patch adhesive wings for 2 minutes.Remove white label marked "1".  Remove white label marked "2".  Rub patch adhesive wings for 2 additional minutes.   While looking in a mirror, press and release button in center of patch.  A small green light will flash 3-4 times .  This will be your only indicator the monitor has been turned on.     Do not shower for the first 24 hours.  You may shower after the first 24 hours.   Press button if you feel a symptom. You will hear a small click.  Record Date, Time and Symptom in the Patient Log Book.   When you are ready to remove patch, follow instructions on last 2 pages of Patient Log Book.  Stick patch monitor onto last page of Patient Log Book.   Place Patient Log Book in [place] box.  Use locking tab on box and tape box closed securely.  The Orange and [employer] has [employer] on it.  Please place in mailbox as soon as possible.  Your physician should have your test results approximately 7 days after the monitor has been mailed back to [facility].   Call House at [phone] if you have questions regarding your ZIO XT patch monitor.  Call them immediately if you see an orange light blinking on your monitor.   If your monitor falls off in less than 4 days contact our Monitor department at [phone].  If your monitor becomes loose or falls off after 4 days call [employer] at [phone] for suggestions on securing your monitor.     Signed, Buford Dresser, MD PhD 11/02/2019   [place]

## 2019-11-02 NOTE — Patient Instructions (Signed)
Medication Instructions:  Your Physician recommend you continue on your current medication as directed.    *If you need a refill on your cardiac medications before your next appointment, please call your pharmacy*   Lab Work: None   Testing/Procedures: Our physician has recommended that you wear an  [place] monitor. The Zio patch cardiac monitor continuously records heart rhythm data for up to 14 days, this is for patients being evaluated for multiple types heart rhythms. For the first 24 hours post application, please avoid getting the Zio monitor wet in the shower or by excessive sweating during exercise. After that, feel free to carry on with regular activities. Keep soaps and lotions away from the ZIO XT Patch.   Someone from our office will call to mail monitor     Follow-Up: At [facility], you and your health needs are our priority.  As part of our continuing mission to provide you with exceptional heart care, we have created designated Provider Care Teams.  These Care Teams include your primary Cardiologist (physician) and Advanced Practice Providers (APPs -  Physician Assistants and Nurse Practitioners) who all work together to provide you with the care you need, when you need it.  We recommend signing up for the patient portal called "MyChart".  Sign up information is provided on this After Visit Summary.  MyChart is used to connect with patients for Virtual Visits (Telemedicine).  Patients are able to view lab/test results, encounter notes, upcoming appointments, etc.  Non-urgent messages can be sent to your provider as well.   To learn more about what you can do with MyChart, go to [email or web address].    Your next appointment:   6 week(s)  The format for your next appointment:   In Person  Provider:   Buford Dresser, MD   [place] Instructions   Your physician has requested you wear your ZIO patch monitor___14____days.   This is  a single patch monitor.  [employer] supplies one patch monitor per enrollment.  Additional stickers are not available.   Please do not apply patch if you will be having a Nuclear Stress Test, Echocardiogram, Cardiac CT, MRI, or Chest Xray during the time frame you would be wearing the monitor. The patch cannot be worn during these tests.  You cannot remove and re-apply the ZIO XT patch monitor.   Your ZIO patch monitor will be sent USPS Priority mail from [facility] directly to your home address. The monitor may also be mailed to a PO BOX if home delivery is not available.   It may take 3-5 days to receive your monitor after you have been enrolled.   Once you have received you monitor, please review enclosed instructions.  Your monitor has already been registered assigning a specific monitor serial # to you.   Applying the monitor   Shave hair from upper left chest.   Hold abrader disc by orange tab.  Rub abrader in 40 strokes over left upper chest as indicated in your monitor instructions.   Clean area with 4 enclosed alcohol pads .  Use all pads to assure are is cleaned thoroughly.  Let dry.   Apply patch as indicated in monitor instructions.  Patch will be place under collarbone on left side of chest with arrow pointing upward.   Rub patch adhesive wings for 2 minutes.Remove white label marked "1".  Remove white label marked "2".  Rub patch adhesive wings for 2 additional minutes.   While  looking in a mirror, press and release button in center of patch.  A small green light will flash 3-4 times .  This will be your only indicator the monitor has been turned on.     Do not shower for the first 24 hours.  You may shower after the first 24 hours.   Press button if you feel a symptom. You will hear a small click.  Record Date, Time and Symptom in the Patient Log Book.   When you are ready to remove patch, follow instructions on last 2 pages of Patient Log Book.  Stick patch monitor  onto last page of Patient Log Book.   Place Patient Log Book in [place] box.  Use locking tab on box and tape box closed securely.  The Orange and [employer] has [employer] on it.  Please place in mailbox as soon as possible.  Your physician should have your test results approximately 7 days after the monitor has been mailed back to [facility].   Call [place] at [phone] if you have questions regarding your ZIO XT patch monitor.  Call them immediately if you see an orange light blinking on your monitor.   If your monitor falls off in less than 4 days contact our Monitor department at [phone].  If your monitor becomes loose or falls off after 4 days call [employer] at [phone] for suggestions on securing your monitor.

## 2019-11-05 ENCOUNTER — Other Ambulatory Visit (INDEPENDENT_AMBULATORY_CARE_PROVIDER_SITE_OTHER): Payer: 59

## 2019-11-05 DIAGNOSIS — R000 Tachycardia, unspecified: Secondary | ICD-10-CM

## 2019-12-15 ENCOUNTER — Ambulatory Visit: Payer: 59 | Admitting: Cardiology

## 2019-12-18 ENCOUNTER — Telehealth (INDEPENDENT_AMBULATORY_CARE_PROVIDER_SITE_OTHER): Payer: 59 | Admitting: Cardiology

## 2019-12-18 VITALS — Ht 65.0 in | Wt 155.4 lb

## 2019-12-18 DIAGNOSIS — R002 Palpitations: Secondary | ICD-10-CM

## 2019-12-18 DIAGNOSIS — R000 Tachycardia, unspecified: Secondary | ICD-10-CM | POA: Diagnosis not present

## 2019-12-18 DIAGNOSIS — Z7189 Other specified counseling: Secondary | ICD-10-CM | POA: Diagnosis not present

## 2019-12-18 NOTE — Patient Instructions (Signed)

## 2019-12-18 NOTE — Progress Notes (Signed)
Virtual Visit via Video Note   This visit type was conducted due to national recommendations for restrictions regarding the COVID-19 Pandemic (e.g. social distancing) in an effort to limit this patient's exposure and mitigate transmission in our community.  Due to her co-morbid illnesses, this patient is at least at moderate risk for complications without adequate follow up.  This format is felt to be most appropriate for this patient at this time.  All issues noted in this document were discussed and addressed.  A limited physical exam was performed with this format.  Please refer to the patient's chart for her consent to telehealth for [facility].       Date:  12/18/2019   ID:  Sierra [name], DOB 09/16/1955, MRN [id number] The patient was identified using 2 identifiers.  Patient Location: Home Provider Location: Home Office  PCP:  Panosh, Standley Brooking, MD  Cardiologist:  Buford Dresser, MD  Electrophysiologist:  None   Evaluation Performed:  Follow-Up Visit  Chief Complaint:  Follow up, review monitor results  History of Present Illness:    Sierra [name] is a 64 y.o. female with PMH Meneire's disease who is seen in follow up for palpitations/tachycardia. Initial visit 6.7.21.  The patient does not have symptoms concerning for COVID-19 infection (fever, chills, cough, or new shortness of breath).   Today: Reviewed results of monitor with her. No significant arrhythmias. Rare PACs/PVCs. Symptomatic events are sinus, many sinus tach, and a few with brief PACs.  We screen shared and reviewed the actual monitor report at length today. Showed the specific episodes of sinus tachycardia.  She did have some very symptomatic episodes with minimal exertion, such as walking into her closet.   We reviewed target heart rate, triggers of tachycardia.  Denies chest pain, shortness of breath at rest or with normal exertion. No PND, orthopnea, LE edema or rapid unexpected weight  gain. No syncope.  Past Medical History:  Diagnosis Date  . History of Doppler ultrasound    leg negative  2010 right  . History of Doppler ultrasound    right leg 2010 neg  . Meniere's disease    on diuretic therapy  . Normal nuclear stress test    Myoview 2006   . Tracheobronchopathia-osteochondroplastica 02/2010   involvement of trachea per FOB   Past Surgical History:  Procedure Laterality Date  . BRONCHOSCOPY  10/11  . BUNIONECTOMY WITH WEIL OSTEOTOMY Right 12/11/2018   Procedure: Right lapidus and modified McBride; 2nd metatarsal Weil and hammertoe correction;  Surgeon: Wylene Simmer, MD;  Location: [place];  Service: Orthopedics;  Laterality: Right;  51min  . DOPPLER ECHOCARDIOGRAPHY  2010   rt leg neg  . KNEE ARTHROSCOPY  2003   right  . KNEE ARTHROSCOPY  05/11/2011   Procedure: ARTHROSCOPY KNEE;  Surgeon: Lorn Junes, MD;  Location: [place];  Service: Orthopedics;  Laterality: Right;  with lateral meniscectomy  . myoview  2006   stress- neg  . SKIN GRAFT  1963     Current Meds  Medication Sig  . potassium chloride SA (KLOR-CON M15) 15 MEQ tablet once a week.   . triamterene-hydrochlorothiazide (MAXZIDE-25) 37.5-25 MG per tablet Take 1 tablet by mouth once a week. Takes for Meniere's disease  . YUVAFEM 10 MCG TABS vaginal tablet Place 1 tablet vaginally 2 (two) times a week.  . [DISCONTINUED] estradiol (ESTRACE) 0.1 MG/GM vaginal cream Place 2 g vaginally daily. Using 2-3 times weekly  Allergies:   Morphine, Latex, Phosphoric acid, and Tributyl phosphate   Social History   Tobacco Use  . Smoking status: Never Smoker  . Smokeless tobacco: Never Used  Vaping Use  . Vaping Use: Never used  Substance Use Topics  . Alcohol use: Yes    Alcohol/week: 0.0 standard drinks    Comment: socially 1-2 a month  . Drug use: No     Family Hx: The patient's family history includes Allergies in her father; Asthma in her father;  Hyperlipidemia in her father; Macular degeneration in her father; Multiple sclerosis in her sister; Prostate cancer in her father; Thyroid cancer in her daughter; Uterine cancer in her mother and sister.  ROS:   Please see the history of present illness.    All other systems reviewed and are negative.   Prior CV studies:   The following studies were reviewed today: Monitor 11/05/19 14 days of data recorded on Zio monitor. Patient had a min HR of 47 bpm, max HR of 148 bpm, and avg HR of 74 bpm. Predominant underlying rhythm was Sinus Rhythm. No VT, atrial fibrillation, high degree block, or pauses noted. Isolated atrial and ventricular ectopy was rare (<1%). There were two episodes of 4 consecutive PACs but no sustained SVT. There were 6 triggered events. These were predominantly sinus rhythm/sinus tachycardia, a few with PACs. No significant arrhythmias detected.   Labs/Other Tests and Data Reviewed:    EKG:  An ECG dated 11/02/19 was personally reviewed today and demonstrated:  sinus bradycardia  Recent Labs: 10/14/2019: ALT 24; BUN 18; Creatinine, Ser 0.69; Hemoglobin 13.3; Magnesium 2.1; Platelets 239.0; Potassium 4.3; Sodium 141; TSH 1.41   Recent Lipid Panel Lab Results  Component Value Date/Time   CHOL 239 (H) 10/14/2019 12:27 PM   TRIG 95.0 10/14/2019 12:27 PM   HDL 87.30 10/14/2019 12:27 PM   CHOLHDL 3 10/14/2019 12:27 PM   LDLCALC 132 (H) 10/14/2019 12:27 PM   LDLDIRECT 112.2 08/22/2012 12:13 PM    Wt Readings from Last 3 Encounters:  12/18/19 155 lb 6.4 oz (70.5 kg)  11/02/19 163 lb 3.2 oz (74 kg)  12/11/18 153 lb 7 oz (69.6 kg)     Objective:    Vital Signs:  Ht 5\' 5"  (1.651 m)   Wt 155 lb 6.4 oz (70.5 kg)   BMI 25.86 kg/m    VITAL SIGNS:  reviewed GEN:  no acute distress EYES:  sclerae anicteric, EOMI - Extraocular Movements Intact RESPIRATORY:  normal respiratory effort, symmetric expansion CARDIOVASCULAR:  no visible JVD SKIN:  no rash, lesions or  ulcers. MUSCULOSKELETAL:  no obvious deformities. NEURO:  alert and oriented x 3, no obvious focal deficit PSYCH:  normal affect  ASSESSMENT & PLAN:    Palpitations, tachycardia: -based on monitor results, there are no significant arrhythmias -triggered symptomatic events are largely sinus, often sinus tach, with rare PACs -discussed normal heart rate variation, triggers  CV risk counseling and primary prevention -recommend heart healthy/Mediterranean diet, with whole grains, fruits, vegetable, fish, lean meats, nuts, and olive oil. Limit salt. -recommend moderate walking, 3-5 times/week for 30-50 minutes each session. Aim for at least 150 minutes.week. Goal should be pace of 3 miles/hours, or walking 1.5 miles in 30 minutes -recommend avoidance of tobacco products. Avoid excess alcohol.  -ASCVD risk score: The 10-year ASCVD risk score Mikey Bussing DC Jr., et al., 2013) is: 5.5%   Values used to calculate the score:     Age: 18 years     Sex:  Female     Is Non-Hispanic African American: No     Diabetic: No     Tobacco smoker: No     Systolic Blood Pressure: 157 mmHg     Is BP treated: No     HDL Cholesterol: 87.3 mg/dL     Total Cholesterol: 239 mg/dL   COVID-19 Education: The signs and symptoms of COVID-19 were discussed with the patient and how to seek care for testing (follow up with PCP or arrange E-visit).  The importance of social distancing was discussed today.  Follow up PRN  Time:   Today, I have spent 24 minutes with the patient with telehealth technology discussing the above problems.  Additional time spent for chart review and documentation.   Medication Adjustments/Labs and Tests Ordered: Current medicines are reviewed at length with the patient today.  Concerns regarding medicines are outlined above.   Tests Ordered: No orders of the defined types were placed in this encounter.   Medication Changes: No orders of the defined types were placed in this  encounter.   Patient Instructions  Medication Instructions:  Your Physician recommend you continue on your current medication as directed.    *If you need a refill on your cardiac medications before your next appointment, please call your pharmacy*   Lab Work: None   Testing/Procedures: None   Follow-Up: At [facility], you and your health needs are our priority.  As part of our continuing mission to provide you with exceptional heart care, we have created designated Provider Care Teams.  These Care Teams include your primary Cardiologist (physician) and Advanced Practice Providers (APPs -  Physician Assistants and Nurse Practitioners) who all work together to provide you with the care you need, when you need it.  We recommend signing up for the patient portal called "MyChart".  Sign up information is provided on this After Visit Summary.  MyChart is used to connect with patients for Virtual Visits (Telemedicine).  Patients are able to view lab/test results, encounter notes, upcoming appointments, etc.  Non-urgent messages can be sent to your provider as well.   To learn more about what you can do with MyChart, go to [email or web address].    Your next appointment:   As needed  The format for your next appointment:   In Person  Provider:   Buford Dresser, MD       Signed, Buford Dresser, MD  12/18/2019   [place]

## 2019-12-21 ENCOUNTER — Encounter: Payer: Self-pay | Admitting: Cardiology

## 2020-02-05 DIAGNOSIS — Z20822 Contact with and (suspected) exposure to covid-19: Secondary | ICD-10-CM | POA: Diagnosis not present

## 2020-03-08 DIAGNOSIS — Z85828 Personal history of other malignant neoplasm of skin: Secondary | ICD-10-CM | POA: Diagnosis not present

## 2020-03-08 DIAGNOSIS — D225 Melanocytic nevi of trunk: Secondary | ICD-10-CM | POA: Diagnosis not present

## 2020-03-08 DIAGNOSIS — D2230 Melanocytic nevi of unspecified part of face: Secondary | ICD-10-CM | POA: Diagnosis not present

## 2020-03-08 DIAGNOSIS — L814 Other melanin hyperpigmentation: Secondary | ICD-10-CM | POA: Diagnosis not present

## 2020-05-05 ENCOUNTER — Ambulatory Visit (INDEPENDENT_AMBULATORY_CARE_PROVIDER_SITE_OTHER): Payer: BC Managed Care – PPO | Admitting: Internal Medicine

## 2020-05-05 ENCOUNTER — Other Ambulatory Visit: Payer: Self-pay

## 2020-05-05 ENCOUNTER — Encounter: Payer: Self-pay | Admitting: Internal Medicine

## 2020-05-05 VITALS — BP 124/84 | HR 88 | Temp 98.2°F | Ht 65.0 in | Wt 158.1 lb

## 2020-05-05 DIAGNOSIS — R002 Palpitations: Secondary | ICD-10-CM | POA: Diagnosis not present

## 2020-05-05 NOTE — Patient Instructions (Signed)
-  Nice seeing you today!!  -Lab work today; will notify you once results are available.  -Touch base with Dr. Harrell Gave regarding your palpitations.

## 2020-05-05 NOTE — Progress Notes (Signed)
Acute Office Visit     This visit occurred during the SARS-CoV-2 public health emergency.  Safety protocols were in place, including screening questions prior to the visit, additional usage of staff PPE, and extensive cleaning of exam room while observing appropriate contact time as indicated for disinfecting solutions.    CC/Reason for Visit: Palpitation, fast heart rate  HPI: Sierra [name] is a 64 y.o. female who is coming in today for the above mentioned reasons.  Sierra [name] is seen today as a work in for palpitations.  Review of her chart and notes that she wore a heart monitor over the summer that only showed some mild PACs and PVCs.  She has been doing fine since.  2 days ago she had a bad headache with a scleral/conjunctival hemorrhage.  Then last night she had an episode of palpitations.  Her apple watch showed a heart rate as high as 179.  She does not have the apple watch that can actually take an EKG.  This lasted for about 30 minutes and finally subsided with Valsalva maneuvers.  During this episode she noticed some lightheadedness but no true dizziness, syncope, shortness of breath or true chest pain.  This morning she had what she describes as mild heartburn that got better with some OTC medication.  She has noticed increasing leg cramps.  She took some extra diuretics that she uses for her Mnire's disease.   Past Medical/Surgical History: Past Medical History:  Diagnosis Date  . History of Doppler ultrasound    leg negative  2010 right  . History of Doppler ultrasound    right leg 2010 neg  . Meniere's disease    on diuretic therapy  . Normal nuclear stress test    Myoview 2006   . Tracheobronchopathia-osteochondroplastica 02/2010   involvement of trachea per FOB    Past Surgical History:  Procedure Laterality Date  . BRONCHOSCOPY  10/11  . BUNIONECTOMY WITH WEIL OSTEOTOMY Right 12/11/2018   Procedure: Right lapidus and modified McBride; 2nd metatarsal Weil and  hammertoe correction;  Surgeon: Wylene Simmer, MD;  Location: [place];  Service: Orthopedics;  Laterality: Right;  28min  . DOPPLER ECHOCARDIOGRAPHY  2010   rt leg neg  . KNEE ARTHROSCOPY  2003   right  . KNEE ARTHROSCOPY  05/11/2011   Procedure: ARTHROSCOPY KNEE;  Surgeon: Lorn Junes, MD;  Location: [place];  Service: Orthopedics;  Laterality: Right;  with lateral meniscectomy  . myoview  2006   stress- neg  . SKIN GRAFT  1963    Social History:  reports that she has never smoked. She has never used smokeless tobacco. She reports current alcohol use. She reports that she does not use drugs.  Allergies: Allergies  Allergen Reactions  . Morphine Hives    N/V  . Latex Rash  . Phosphoric Acid Hives  . Tributyl Phosphate Hives    Family History:  Family History  Problem Relation Age of Onset  . Uterine cancer Mother        sister  . Asthma Father   . Hyperlipidemia Father   . Macular degeneration Father   . Allergies Father   . Prostate cancer Father   . Multiple sclerosis Sister   . Uterine cancer Sister   . Thyroid cancer Daughter      Current Outpatient Medications:  .  potassium chloride SA (KLOR-CON M15) 15 MEQ tablet, once a week. , Disp: , Rfl:  .  triamterene-hydrochlorothiazide (MAXZIDE-25) 37.5-25 MG per tablet, Take 1 tablet by mouth once a week. Takes for Meniere's disease, Disp: , Rfl:  .  YUVAFEM 10 MCG TABS vaginal tablet, Place 1 tablet vaginally 2 (two) times a week., Disp: , Rfl:   Review of Systems:  Constitutional: Denies fever, chills, diaphoresis, appetite change and fatigue.  HEENT: Denies photophobia, eye pain, redness, hearing loss, ear pain, congestion, sore throat, rhinorrhea, sneezing, mouth sores, trouble swallowing, neck pain, neck stiffness and tinnitus.   Respiratory: Denies SOB, DOE, cough, chest tightness,  and wheezing.   Cardiovascular: Denies  leg swelling.  Gastrointestinal: Denies nausea,  vomiting, abdominal pain, diarrhea, constipation, blood in stool and abdominal distention.  Genitourinary: Denies dysuria, urgency, frequency, hematuria, flank pain and difficulty urinating.  Endocrine: Denies: hot or cold intolerance, sweats, changes in hair or nails, polyuria, polydipsia. Musculoskeletal: Denies myalgias, back pain, joint swelling, arthralgias and gait problem.  Skin: Denies pallor, rash and wound.  Neurological: Denies dizziness, seizures, syncope, weakness, numbness and headaches.  Hematological: Denies adenopathy. Easy bruising, personal or family bleeding history  Psychiatric/Behavioral: Denies suicidal ideation, mood changes, confusion, nervousness, sleep disturbance and agitation    Physical Exam: Vitals:   05/05/20 1612  BP: 124/84  Pulse: 88  Temp: 98.2 F (36.8 C)  TempSrc: Oral  SpO2: 95%  Weight: 158 lb 1.6 oz (71.7 kg)  Height: 5\' 5"  (1.651 m)    Body mass index is 26.31 kg/m.   Constitutional: NAD, calm, comfortable Eyes: Wears corrective lenses, right conjunctival, scleral hemorrhage. ENMT: Mucous membranes are moist. Respiratory: clear to auscultation bilaterally, no wheezing, no crackles. Normal respiratory effort. No accessory muscle use.  Cardiovascular: Regular rate and rhythm, no murmurs / rubs / gallops. No extremity edema.  Neurologic: Grossly intact and nonfocal Psychiatric: Normal judgment and insight. Alert and oriented x 3. Normal mood.    Impression and Plan:  Palpitations -EKG in office today shows normal sinus rhythm at a rate of 75, no acute ST or T wave changes, no ectopic beats. -Her TSH was normal in May. -I wonder if she may have some electrolyte abnormalities as she has been taking some extra diuretics for her Mnire's disease.  Check basic metabolic profile and magnesium today. -She did wear a monitor over the summer that showed some PACs.  She is followed by Dr. Harrell Gave for this. -I have advised that she touch  base with Dr. Harrell Gave to see if any further work-up may be necessary, wonder if an implanted loop recorder might be a good idea as her episodes are so infrequent.  I have also told her that may be upgrading her Apple Watch to one that can do EKGs might be helpful in the future. -No signs symptoms today concerning for acute coronary syndrome or stroke.     Patient Instructions  -Nice seeing you today!!  -Lab work today; will notify you once results are available.  -Touch base with Dr. Harrell Gave regarding your palpitations.     Lelon Frohlich, MD [facility]

## 2020-05-06 ENCOUNTER — Other Ambulatory Visit: Payer: Self-pay | Admitting: Internal Medicine

## 2020-05-06 DIAGNOSIS — R002 Palpitations: Secondary | ICD-10-CM

## 2020-05-06 LAB — BASIC METABOLIC PANEL
BUN: 18 mg/dL (ref 7–25)
CO2: 30 mmol/L (ref 20–32)
Calcium: 10.8 mg/dL — ABNORMAL HIGH (ref 8.6–10.4)
Chloride: 100 mmol/L (ref 98–110)
Creat: 0.76 mg/dL (ref 0.50–0.99)
Glucose, Bld: 88 mg/dL (ref 65–99)
Potassium: 4.7 mmol/L (ref 3.5–5.3)
Sodium: 139 mmol/L (ref 135–146)

## 2020-05-06 LAB — SPECIMEN COMPROMISED

## 2020-05-06 LAB — MAGNESIUM: Magnesium: 2.1 mg/dL (ref 1.5–2.5)

## 2020-05-11 ENCOUNTER — Ambulatory Visit: Payer: BC Managed Care – PPO | Admitting: Cardiology

## 2020-05-16 DIAGNOSIS — Z20822 Contact with and (suspected) exposure to covid-19: Secondary | ICD-10-CM | POA: Diagnosis not present

## 2020-05-19 NOTE — Telephone Encounter (Signed)
I have been out of office for the past 2 weeks   Couldn't answer message.   I would  Get back with cardiology  . Wonder if you need a longer  Monitoring test .  If feeling ok  Not emergent but if get  Rates in 150 + sustained  Be seen so can get an EKG  At that time   (IF you have an apple watch  Sometimes can see what the rhythm  Is)

## 2020-05-31 DIAGNOSIS — Z124 Encounter for screening for malignant neoplasm of cervix: Secondary | ICD-10-CM | POA: Diagnosis not present

## 2020-05-31 DIAGNOSIS — Z6827 Body mass index (BMI) 27.0-27.9, adult: Secondary | ICD-10-CM | POA: Diagnosis not present

## 2020-05-31 DIAGNOSIS — N952 Postmenopausal atrophic vaginitis: Secondary | ICD-10-CM | POA: Diagnosis not present

## 2020-05-31 DIAGNOSIS — Z01411 Encounter for gynecological examination (general) (routine) with abnormal findings: Secondary | ICD-10-CM | POA: Diagnosis not present

## 2020-05-31 DIAGNOSIS — Z1231 Encounter for screening mammogram for malignant neoplasm of breast: Secondary | ICD-10-CM | POA: Diagnosis not present

## 2020-05-31 DIAGNOSIS — B373 Candidiasis of vulva and vagina: Secondary | ICD-10-CM | POA: Diagnosis not present

## 2020-06-09 ENCOUNTER — Ambulatory Visit (INDEPENDENT_AMBULATORY_CARE_PROVIDER_SITE_OTHER): Payer: BC Managed Care – PPO | Admitting: Cardiology

## 2020-06-09 ENCOUNTER — Encounter: Payer: Self-pay | Admitting: Cardiology

## 2020-06-09 ENCOUNTER — Other Ambulatory Visit: Payer: Self-pay

## 2020-06-09 VITALS — BP 117/61 | HR 68 | Ht 65.0 in | Wt 160.0 lb

## 2020-06-09 DIAGNOSIS — Z7189 Other specified counseling: Secondary | ICD-10-CM

## 2020-06-09 DIAGNOSIS — R002 Palpitations: Secondary | ICD-10-CM

## 2020-06-09 DIAGNOSIS — I479 Paroxysmal tachycardia, unspecified: Secondary | ICD-10-CM

## 2020-06-09 NOTE — Progress Notes (Signed)
Cardiology Office Note:    Date:  06/09/2020   ID:  Sierra [name], DOB Feb 06, 1956, MRN [id number]  PCP:  Burnis Medin, MD  Cardiologist:  Buford Dresser, MD  Referring MD: Burnis Medin, MD   Chief Complaint:  Follow up  History of Present Illness:    Sierra [name] is a 65 y.o. female with PMH Meneire's disease who is seen in follow up for palpitations/tachycardia. Initial visit 6.7.21.  Cardiac history: referred for palpitations/tachycardia 10/2019. Monitor done, sinus tachycardia, rare PACs/PVCs. No significant arrhythmias.   Today: Seen by Dr. Isaac Bliss 05/05/20. Noted to have heart rate to 179 on apple watch for about 30 minutes. Recommended to return to cardiology for discussion of further workup.  Had a very stressful few weeks leading up to her headache and tachycardia.   She has had no syncope, no indication for loop recorder. Discussed [employer] today. She has since upgraded her Apple watch to a model that can take ECGs.  We discussed options for management and evaluation, see below. She has not had another severe episode since the one in early December.  Denies chest pain, shortness of breath at rest or with normal exertion. No PND, orthopnea, LE edema or unexpected weight gain. No syncope.  Past Medical History:  Diagnosis Date  . History of Doppler ultrasound    leg negative  2010 right  . History of Doppler ultrasound    right leg 2010 neg  . Meniere's disease    on diuretic therapy  . Normal nuclear stress test    Myoview 2006   . Tracheobronchopathia-osteochondroplastica 02/2010   involvement of trachea per FOB   Past Surgical History:  Procedure Laterality Date  . BRONCHOSCOPY  10/11  . BUNIONECTOMY WITH WEIL OSTEOTOMY Right 12/11/2018   Procedure: Right lapidus and modified McBride; 2nd metatarsal Weil and hammertoe correction;  Surgeon: Wylene Simmer, MD;  Location: [facility];  Service: Orthopedics;   Laterality: Right;  40min  . DOPPLER ECHOCARDIOGRAPHY  2010   rt leg neg  . KNEE ARTHROSCOPY  2003   right  . KNEE ARTHROSCOPY  05/11/2011   Procedure: ARTHROSCOPY KNEE;  Surgeon: Lorn Junes, MD;  Location: [facility];  Service: Orthopedics;  Laterality: Right;  with lateral meniscectomy  . myoview  2006   stress- neg  . SKIN GRAFT  1963     No outpatient medications have been marked as taking for the 06/09/20 encounter (Appointment) with Buford Dresser, MD.     Allergies:   Morphine, Latex, Phosphoric acid, and Tributyl phosphate   Social History   Tobacco Use  . Smoking status: Never Smoker  . Smokeless tobacco: Never Used  Vaping Use  . Vaping Use: Never used  Substance Use Topics  . Alcohol use: Yes    Alcohol/week: 0.0 standard drinks    Comment: socially 1-2 a month  . Drug use: No     Family Hx: The patient's family history includes Allergies in her father; Asthma in her father; Hyperlipidemia in her father; Macular degeneration in her father; Multiple sclerosis in her sister; Prostate cancer in her father; Thyroid cancer in her daughter; Uterine cancer in her mother and sister.  ROS:   Please see the history of present illness.    All other systems reviewed and are negative.   Prior CV studies:   The following studies were reviewed today: Monitor 11/05/19 14 days of data recorded on Zio monitor. Patient had a min  HR of 47 bpm, max HR of 148 bpm, and avg HR of 74 bpm. Predominant underlying rhythm was Sinus Rhythm. No VT, atrial fibrillation, high degree block, or pauses noted. Isolated atrial and ventricular ectopy was rare (<1%). There were two episodes of 4 consecutive PACs but no sustained SVT. There were 6 triggered events. These were predominantly sinus rhythm/sinus tachycardia, a few with PACs. No significant arrhythmias detected.   Labs/Other Tests and Data Reviewed:    EKG:  An ECG dated 06/09/20 was personally reviewed today and  demonstrated:  normal sinus rhythm at 68 bpm  Recent Labs: 10/14/2019: ALT 24; Hemoglobin 13.3; Platelets 239.0; TSH 1.41 05/05/2020: BUN 18; Creat 0.76; Magnesium 2.1; Potassium 4.7; Sodium 139   Recent Lipid Panel Lab Results  Component Value Date/Time   CHOL 239 (H) 10/14/2019 12:27 PM   TRIG 95.0 10/14/2019 12:27 PM   HDL 87.30 10/14/2019 12:27 PM   CHOLHDL 3 10/14/2019 12:27 PM   LDLCALC 132 (H) 10/14/2019 12:27 PM   LDLDIRECT 112.2 08/22/2012 12:13 PM    Objective:    Vital Signs:  BP 117/61   Pulse 68   Ht 5\' 5"  (1.651 m)   Wt 160 lb (72.6 kg)   SpO2 96%   BMI 26.63 kg/m    Wt Readings from Last 3 Encounters:  06/09/20 160 lb (72.6 kg)  05/05/20 158 lb 1.6 oz (71.7 kg)  12/18/19 155 lb 6.4 oz (70.5 kg)   GEN: Well nourished, well developed in no acute distress HEENT: Normal, moist mucous membranes NECK: No JVD CARDIAC: regular rhythm, normal S1 and S2, no rubs or gallops. No murmur. VASCULAR: Radial and DP pulses 2+ bilaterally. No carotid bruits RESPIRATORY:  Clear to auscultation without rales, wheezing or rhonchi  ABDOMEN: Soft, non-tender, non-distended MUSCULOSKELETAL:  Ambulates independently SKIN: Warm and dry, no edema NEUROLOGIC:  Alert and oriented x 3. No focal neuro deficits noted. PSYCHIATRIC:  Normal affect   ASSESSMENT & PLAN:    1. Heart palpitations   2. Paroxysmal tachycardia ([place])   3. Counseling on health promotion and disease prevention   4. Cardiac risk counseling     Palpitations, tachycardia: -based on monitor results, there are no significant arrhythmias -triggered symptomatic events are largely sinus, often sinus tach, with rare PACs -discussed normal heart rate variation, triggers -unclear what caused most recent severe episode. Did discuss arrhythmias such as SVT that can be periodic. Discussed vagal maneuvers -she will capture ECG on her Apple watch if this occurs again and send to me -discussed options for management,  including medications and referral to EP, based on results of ECG during an event. She would like to monitor for now. -discussed red flag warning signs that need immediate medical attention -ECG normal sinus rhythm today  CV risk counseling and primary prevention -recommend heart healthy/Mediterranean diet, with whole grains, fruits, vegetable, fish, lean meats, nuts, and olive oil. Limit salt. -recommend moderate walking, 3-5 times/week for 30-50 minutes each session. Aim for at least 150 minutes.week. Goal should be pace of 3 miles/hours, or walking 1.5 miles in 30 minutes -recommend avoidance of tobacco products. Avoid excess alcohol.  -ASCVD risk score: The 10-year ASCVD risk score Mikey Bussing DC Jr., et al., 2013) is: 4.7%   Values used to calculate the score:     Age: 53 years     Sex: Female     Is Non-Hispanic African American: No     Diabetic: No     Tobacco smoker: No  Systolic Blood Pressure: [id number] mmHg     Is BP treated: No     HDL Cholesterol: 87.3 mg/dL     Total Cholesterol: 239 mg/dL   Follow up: 2 years or sooner as needed  Buford Dresser, MD, PhD, [facility]  [address] [place], [place] [address] [phone]   Medication Adjustments/Labs and Tests Ordered: Current medicines are reviewed at length with the patient today.  Concerns regarding medicines are outlined above.   Tests Ordered: Orders Placed This Encounter  Procedures  . EKG 12-Lead    Medication Changes: No orders of the defined types were placed in this encounter.   Patient Instructions   Follow-Up: At [facility], you and your health needs are our priority.  As part of our continuing mission to provide you with exceptional heart care, we have created designated Provider Care Teams.  These Care Teams include your primary Cardiologist (physician) and Advanced Practice Providers (APPs -  Physician Assistants and Nurse Practitioners) who all work  together to provide you with the care you need, when you need it.  We recommend signing up for the patient portal called "MyChart".  Sign up information is provided on this After Visit Summary.  MyChart is used to connect with patients for Virtual Visits (Telemedicine).  Patients are able to view lab/test results, encounter notes, upcoming appointments, etc.  Non-urgent messages can be sent to your provider as well.   To learn more about what you can do with MyChart, go to [email or web address].    Your next appointment:     2 years with Dr Harrell Gave   Signed, Buford Dresser, MD  06/09/2020   [facility]

## 2020-06-09 NOTE — Patient Instructions (Signed)
  Follow-Up: At [facility], you and your health needs are our priority.  As part of our continuing mission to provide you with exceptional heart care, we have created designated Provider Care Teams.  These Care Teams include your primary Cardiologist (physician) and Advanced Practice Providers (APPs -  Physician Assistants and Nurse Practitioners) who all work together to provide you with the care you need, when you need it.  We recommend signing up for the patient portal called "MyChart".  Sign up information is provided on this After Visit Summary.  MyChart is used to connect with patients for Virtual Visits (Telemedicine).  Patients are able to view lab/test results, encounter notes, upcoming appointments, etc.  Non-urgent messages can be sent to your provider as well.   To learn more about what you can do with MyChart, go to [email or web address].    Your next appointment:     2 years with Dr Harrell Gave

## 2020-06-12 ENCOUNTER — Encounter: Payer: Self-pay | Admitting: Cardiology

## 2020-07-05 DIAGNOSIS — Z13820 Encounter for screening for osteoporosis: Secondary | ICD-10-CM | POA: Diagnosis not present

## 2020-07-05 LAB — HM DEXA SCAN: HM Dexa Scan: NORMAL

## 2020-07-12 DIAGNOSIS — Z20822 Contact with and (suspected) exposure to covid-19: Secondary | ICD-10-CM | POA: Diagnosis not present

## 2020-08-17 ENCOUNTER — Encounter: Payer: Self-pay | Admitting: Internal Medicine

## 2020-09-29 DIAGNOSIS — Z20822 Contact with and (suspected) exposure to covid-19: Secondary | ICD-10-CM | POA: Diagnosis not present

## 2020-10-13 DIAGNOSIS — Z20822 Contact with and (suspected) exposure to covid-19: Secondary | ICD-10-CM | POA: Diagnosis not present

## 2020-10-15 DIAGNOSIS — Z20822 Contact with and (suspected) exposure to covid-19: Secondary | ICD-10-CM | POA: Diagnosis not present

## 2020-10-16 DIAGNOSIS — Z20822 Contact with and (suspected) exposure to covid-19: Secondary | ICD-10-CM | POA: Diagnosis not present

## 2020-10-17 DIAGNOSIS — Z20822 Contact with and (suspected) exposure to covid-19: Secondary | ICD-10-CM | POA: Diagnosis not present

## 2020-10-18 DIAGNOSIS — Z20822 Contact with and (suspected) exposure to covid-19: Secondary | ICD-10-CM | POA: Diagnosis not present

## 2020-11-10 DIAGNOSIS — Z20822 Contact with and (suspected) exposure to covid-19: Secondary | ICD-10-CM | POA: Diagnosis not present

## 2020-11-10 DIAGNOSIS — Z20828 Contact with and (suspected) exposure to other viral communicable diseases: Secondary | ICD-10-CM | POA: Diagnosis not present

## 2020-11-23 DIAGNOSIS — Z20822 Contact with and (suspected) exposure to covid-19: Secondary | ICD-10-CM | POA: Diagnosis not present

## 2020-12-06 DIAGNOSIS — H00024 Hordeolum internum left upper eyelid: Secondary | ICD-10-CM | POA: Diagnosis not present

## 2020-12-14 DIAGNOSIS — Z20822 Contact with and (suspected) exposure to covid-19: Secondary | ICD-10-CM | POA: Diagnosis not present

## 2020-12-29 DIAGNOSIS — H903 Sensorineural hearing loss, bilateral: Secondary | ICD-10-CM | POA: Diagnosis not present

## 2020-12-29 DIAGNOSIS — H8102 Meniere's disease, left ear: Secondary | ICD-10-CM | POA: Diagnosis not present

## 2021-01-17 DIAGNOSIS — Z20828 Contact with and (suspected) exposure to other viral communicable diseases: Secondary | ICD-10-CM | POA: Diagnosis not present

## 2021-02-01 DIAGNOSIS — Z20828 Contact with and (suspected) exposure to other viral communicable diseases: Secondary | ICD-10-CM | POA: Diagnosis not present

## 2021-02-03 DIAGNOSIS — Z20822 Contact with and (suspected) exposure to covid-19: Secondary | ICD-10-CM | POA: Diagnosis not present

## 2021-03-08 DIAGNOSIS — L814 Other melanin hyperpigmentation: Secondary | ICD-10-CM | POA: Diagnosis not present

## 2021-03-08 DIAGNOSIS — L821 Other seborrheic keratosis: Secondary | ICD-10-CM | POA: Diagnosis not present

## 2021-03-08 DIAGNOSIS — D225 Melanocytic nevi of trunk: Secondary | ICD-10-CM | POA: Diagnosis not present

## 2021-03-08 DIAGNOSIS — Z85828 Personal history of other malignant neoplasm of skin: Secondary | ICD-10-CM | POA: Diagnosis not present

## 2021-03-14 ENCOUNTER — Ambulatory Visit: Payer: BC Managed Care – PPO

## 2021-04-19 ENCOUNTER — Encounter: Payer: BC Managed Care – PPO | Admitting: Internal Medicine

## 2021-05-03 DIAGNOSIS — Z20822 Contact with and (suspected) exposure to covid-19: Secondary | ICD-10-CM | POA: Diagnosis not present

## 2021-06-18 NOTE — Progress Notes (Signed)
Chief Complaint  Patient presents with   Annual Exam    Not fasting would like orders placed and could come in the morning    HPI: Patient  Sierra [name]  66 y.o. comes in today for Preventive Health Care visit   Had Covid early December. . Doing ok.    Cardiovascular work-up in past for the palpitations no serious finding. Mnire's seems to be stable but does get leg cramps from the medicine diuretic Takes liquid ttumeric   Due fo colon screening  neg fam hx or sx or polyps  Is utid on pneumonia vaccine Health Maintenance  Topic Date Due   Hepatitis C Screening  Never done   COLONOSCOPY (Pts 45-20yrs Insurance coverage will need to be confirmed)  12/17/2021 (Originally 08/06/2020)   COVID-19 Vaccine (1) 12/17/2021 (Originally 10/29/1956)   Zoster Vaccines- Shingrix (1 of 2) 12/17/2021 (Originally 04/29/1974)   MAMMOGRAM  06/26/2022   TETANUS/TDAP  03/24/2027   Pneumonia Vaccine 66+ Years old  Completed   INFLUENZA VACCINE  Completed   DEXA SCAN  Completed   HPV VACCINES  Aged Out   Health Maintenance Review LIFESTYLE:  Exercise:  yes  Tobacco/ETS:n AlcoholS  Sugar beverages:n Sleep:y Drug use: no HH of 2 Work: FT  ROS:  REST of 12 system review negative except as per HPI   Past Medical History:  Diagnosis Date   Acute lateral meniscus tear of right knee 05/03/2011   Allergic urticaria 08/06/2007   Qualifier: Diagnosis of  By: Sherren Mocha MD, Dellis Filbert A    History of Doppler ultrasound    leg negative  2010 right   History of Doppler ultrasound    right leg 2010 neg   Meniere's disease    on diuretic therapy   Normal nuclear stress test    Myoview 2006    PHLEBITIS, LOWER EXTREMITY 09/17/2008   Qualifier: Diagnosis of  By: Sherlynn Stalls, [place], Cindy     Tracheobronchopathia-osteochondroplastica 02/2010   involvement of trachea per FOB    Past Surgical History:  Procedure Laterality Date   BRONCHOSCOPY  10/11   BUNIONECTOMY WITH WEIL OSTEOTOMY Right 12/11/2018    Procedure: Right lapidus and modified McBride; 2nd metatarsal Weil and hammertoe correction;  Surgeon: Wylene Simmer, MD;  Location: [facility];  Service: Orthopedics;  Laterality: Right;  68min   DOPPLER ECHOCARDIOGRAPHY  2010   rt leg neg   KNEE ARTHROSCOPY  2003   right   KNEE ARTHROSCOPY  05/11/2011   Procedure: ARTHROSCOPY KNEE;  Surgeon: Lorn Junes, MD;  Location: [facility];  Service: Orthopedics;  Laterality: Right;  with lateral meniscectomy   myoview  2006   stress- neg   SKIN GRAFT  1963    Family History  Problem Relation Age of Onset   Uterine cancer Mother        sister   Asthma Father    Hyperlipidemia Father    Macular degeneration Father    Allergies Father    Prostate cancer Father    Multiple sclerosis Sister    Uterine cancer Sister    Thyroid cancer Daughter     Social History   Socioeconomic History   Marital status: Married    Spouse name: Not on file   Number of children: Not on file   Years of education: Not on file   Highest education level: Not on file  Occupational History   Not on file  Tobacco Use   Smoking status: Never  Smokeless tobacco: Never  Vaping Use   Vaping Use: Never used  Substance and Sexual Activity   Alcohol use: Yes    Alcohol/week: 0.0 standard drinks    Comment: socially 1-2 a month   Drug use: No   Sexual activity: Yes    Birth control/protection: Post-menopausal  Other Topics Concern   Not on file  Social History Narrative   Regular Exercise- Yes   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   Social Determinants of Health   Financial Resource Strain: Not on file  Food Insecurity: Not on file  Transportation Needs: Not on file  Physical Activity: Not on file  Stress: Not on file  Social Connections: Not on file    Outpatient Medications Prior to Visit  Medication Sig Dispense Refill   triamterene-hydrochlorothiazide (MAXZIDE-25) 37.5-25 MG per tablet Take 1 tablet by  mouth once a week. Takes for Meniere's disease     YUVAFEM 10 MCG TABS vaginal tablet Place 1 tablet vaginally 2 (two) times a week.     potassium chloride SA (KLOR-CON M15) 15 MEQ tablet once a week.  (Patient not taking: Reported on 06/19/2021)     No facility-administered medications prior to visit.     EXAM:  BP 120/80 (BP Location: Left Arm, Patient Position: Sitting, Cuff Size: Normal)    Temp 98.7 F (37.1 C) (Oral)    Ht 5\' 5"  (1.651 m)    Wt 155 lb 3.2 oz (70.4 kg)    BMI 25.83 kg/m   Body mass index is 25.83 kg/m. Wt Readings from Last 3 Encounters:  06/19/21 155 lb 3.2 oz (70.4 kg)  06/09/20 160 lb (72.6 kg)  05/05/20 158 lb 1.6 oz (71.7 kg)    Physical Exam: Vital signs reviewed LOV:FIEP is a well-developed well-nourished alert cooperative    who appearsr stated age in no acute distress.  HEENT: normocephalic atraumatic , Eyes: PERRL EOM's full, conjunctiva clear, Nares: paten,t no deformity discharge or tenderness., Ears: no deformity EAC's clear TMs with normal landmarks. Mouth: masked NECK: supple without masses, thyromegaly or bruits. CHEST/PULM:  Clear to auscultation and percussion breath sounds equal no wheeze , rales or rhonchi. No chest wall deformities or tenderness. Breast: normal by inspection . No dimpling, discharge, masses, tenderness or discharge . CV: PMI is nondisplaced, S1 S2 no gallops, murmurs, rubs. Peripheral pulses are full without delay.No JVD .  ABDOMEN: Bowel sounds normal nontender  No guard or rebound, no hepato splenomegal no CVA tenderness.  Extremtities:  No clubbing cyanosis or edema, no acute joint swelling or redness no focal atrophy had fingers mild arthritis changes  NEURO:  Oriented x3, cranial nerves 3-12 appear to be intact, no obvious focal weakness,gait within normal limits no abnormal reflexes or asymmetrical SKIN: No acute rashes normal turgor, color, no bruising or petechiae. PSYCH: Oriented, good eye contact, no obvious  depression anxiety, cognition and judgment appear normal. LN: no cervical axillary adenopathy  Lab Results  Component Value Date   WBC 5.8 10/14/2019   HGB 13.3 10/14/2019   HCT 39.3 10/14/2019   PLT 239.0 10/14/2019   GLUCOSE 88 05/05/2020   CHOL 239 (H) 10/14/2019   TRIG 95.0 10/14/2019   HDL 87.30 10/14/2019   LDLDIRECT 112.2 08/22/2012   LDLCALC 132 (H) 10/14/2019   ALT 24 10/14/2019   AST 23 10/14/2019   NA 139 05/05/2020   K 4.7 05/05/2020   CL 100 05/05/2020   CREATININE 0.76 05/05/2020   BUN  18 05/05/2020   CO2 30 05/05/2020   TSH 1.41 10/14/2019   INR 1.2 (H) 09/21/2010   HGBA1C 6.0 12/03/2017    BP Readings from Last 3 Encounters:  06/19/21 120/80  06/09/20 117/61  05/05/20 124/84    Lab plan reviewed with patient   ASSESSMENT AND PLAN:  Discussed the following assessment and plan:    ICD-10-CM   1. Visit for preventive health examination  Z56.38 Basic metabolic panel    CBC with Differential/Platelet    Hepatic function panel    Lipid panel    TSH    T4, free    Hemoglobin A1c    Magnesium    2. Meniere's disease, unspecified laterality  V56.43 Basic metabolic panel    CBC with Differential/Platelet    Hepatic function panel    Lipid panel    TSH    T4, free    Hemoglobin A1c    Magnesium   left  last seen dr Thornell Mule    3. Palpitations  P29.5 Basic metabolic panel    CBC with Differential/Platelet    Hepatic function panel    Lipid panel    TSH    T4, free    Hemoglobin A1c    Magnesium   zio eval jan 22 no sig  findings     4. Medication management  J88.416 Basic metabolic panel    CBC with Differential/Platelet    Hepatic function panel    Lipid panel    TSH    T4, free    Hemoglobin A1c    Magnesium   leg cramps seem to be from diuretic med for menieres    5. Colon cancer screening  Z12.11 Cologuard   average risk    6. History of COVID-19  Z86.16    dec 22    Fasting lab  Plan yearly fu as indicated  Return in about  1 year (around 06/19/2022) for depending on results fasting lab .  Patient Care Team: Seraphina Mitchner, Standley Brooking, MD as PCP - General Buford Dresser, MD as PCP - Cardiology (Cardiology) Sypher, Herbie Baltimore, MD (Inactive) (Orthopedic Surgery) Verdis Frederickson, MD (Inactive) (Obstetrics and Gynecology) Haroldine Laws, Shaune Pascal, MD as Attending Physician (Cardiology) Vicie Mutters, MD as Attending Physician (Otolaryngology) Patient Instructions  Good to see you today. Shingrix 1 today  (#2 in 2-6 months ) Will order cologuard  colon screening due  Make fasting  lab appt.  If all ok  yearly check  Try magnesium 400 - 500 for leg cramps to see if helps.   Standley Brooking. Verl Kitson M.D.

## 2021-06-19 ENCOUNTER — Ambulatory Visit (INDEPENDENT_AMBULATORY_CARE_PROVIDER_SITE_OTHER): Payer: BC Managed Care – PPO | Admitting: Internal Medicine

## 2021-06-19 ENCOUNTER — Encounter: Payer: Self-pay | Admitting: Internal Medicine

## 2021-06-19 VITALS — BP 120/80 | Temp 98.7°F | Ht 65.0 in | Wt 155.2 lb

## 2021-06-19 DIAGNOSIS — Z0000 Encounter for general adult medical examination without abnormal findings: Secondary | ICD-10-CM

## 2021-06-19 DIAGNOSIS — H8109 Meniere's disease, unspecified ear: Secondary | ICD-10-CM

## 2021-06-19 DIAGNOSIS — R002 Palpitations: Secondary | ICD-10-CM

## 2021-06-19 DIAGNOSIS — Z1211 Encounter for screening for malignant neoplasm of colon: Secondary | ICD-10-CM

## 2021-06-19 DIAGNOSIS — Z23 Encounter for immunization: Secondary | ICD-10-CM | POA: Diagnosis not present

## 2021-06-19 DIAGNOSIS — Z79899 Other long term (current) drug therapy: Secondary | ICD-10-CM | POA: Diagnosis not present

## 2021-06-19 DIAGNOSIS — Z8616 Personal history of COVID-19: Secondary | ICD-10-CM | POA: Insufficient documentation

## 2021-06-19 NOTE — Patient Instructions (Signed)
Good to see you today. Shingrix 1 today  (#2 in 2-6 months ) Will order cologuard  colon screening due  Make fasting  lab appt.  If all ok  yearly check  Try magnesium 400 - 500 for leg cramps to see if helps.

## 2021-06-19 NOTE — Addendum Note (Signed)
Addended by: Geradine Girt D on: 06/19/2021 02:29 PM   Modules accepted: Orders

## 2021-06-21 ENCOUNTER — Other Ambulatory Visit (INDEPENDENT_AMBULATORY_CARE_PROVIDER_SITE_OTHER): Payer: BC Managed Care – PPO

## 2021-06-21 DIAGNOSIS — N898 Other specified noninflammatory disorders of vagina: Secondary | ICD-10-CM | POA: Diagnosis not present

## 2021-06-21 DIAGNOSIS — Z01411 Encounter for gynecological examination (general) (routine) with abnormal findings: Secondary | ICD-10-CM | POA: Diagnosis not present

## 2021-06-21 DIAGNOSIS — H8109 Meniere's disease, unspecified ear: Secondary | ICD-10-CM

## 2021-06-21 DIAGNOSIS — Z6827 Body mass index (BMI) 27.0-27.9, adult: Secondary | ICD-10-CM | POA: Diagnosis not present

## 2021-06-21 DIAGNOSIS — R002 Palpitations: Secondary | ICD-10-CM | POA: Diagnosis not present

## 2021-06-21 DIAGNOSIS — N952 Postmenopausal atrophic vaginitis: Secondary | ICD-10-CM | POA: Diagnosis not present

## 2021-06-21 DIAGNOSIS — Z79899 Other long term (current) drug therapy: Secondary | ICD-10-CM

## 2021-06-21 DIAGNOSIS — B3731 Acute candidiasis of vulva and vagina: Secondary | ICD-10-CM | POA: Diagnosis not present

## 2021-06-21 DIAGNOSIS — Z0000 Encounter for general adult medical examination without abnormal findings: Secondary | ICD-10-CM | POA: Diagnosis not present

## 2021-06-21 DIAGNOSIS — Z1231 Encounter for screening mammogram for malignant neoplasm of breast: Secondary | ICD-10-CM | POA: Diagnosis not present

## 2021-06-21 LAB — BASIC METABOLIC PANEL
BUN: 16 mg/dL (ref 6–23)
CO2: 28 mEq/L (ref 19–32)
Calcium: 9.7 mg/dL (ref 8.4–10.5)
Chloride: 106 mEq/L (ref 96–112)
Creatinine, Ser: 0.63 mg/dL (ref 0.40–1.20)
GFR: 92.62 mL/min (ref >60.00)
Glucose, Bld: 98 mg/dL (ref 70–99)
Potassium: 3.6 mEq/L (ref 3.5–5.1)
Sodium: 141 mEq/L (ref 135–145)

## 2021-06-21 LAB — HEPATIC FUNCTION PANEL
ALT: 18 U/L (ref 0–35)
AST: 20 U/L (ref 0–37)
Albumin: 4.2 g/dL (ref 3.5–5.2)
Alkaline Phosphatase: 65 U/L (ref 39–117)
Bilirubin, Direct: 0 mg/dL (ref 0.0–0.3)
Total Bilirubin: 0.4 mg/dL (ref 0.2–1.2)
Total Protein: 7.1 g/dL (ref 6.0–8.3)

## 2021-06-21 LAB — LIPID PANEL
Cholesterol: 224 mg/dL — ABNORMAL HIGH (ref 0–200)
HDL: 84.8 mg/dL (ref >39.00)
LDL Cholesterol: 124 mg/dL — ABNORMAL HIGH (ref 0–99)
NonHDL: 139.51
Total CHOL/HDL Ratio: 3
Triglycerides: 80 mg/dL (ref 0.0–149.0)
VLDL: 16 mg/dL (ref 0.0–40.0)

## 2021-06-21 LAB — CBC WITH DIFFERENTIAL/PLATELET
Basophils Absolute: 0.1 10*3/uL (ref 0.0–0.1)
Basophils Relative: 1.3 % (ref 0.0–3.0)
Eosinophils Absolute: 0.2 10*3/uL (ref 0.0–0.7)
Eosinophils Relative: 5 % (ref 0.0–5.0)
HCT: 40.2 % (ref 36.0–46.0)
Hemoglobin: 13.3 g/dL (ref 12.0–15.0)
Lymphocytes Relative: 31.2 % (ref 12.0–46.0)
Lymphs Abs: 1.5 10*3/uL (ref 0.7–4.0)
MCHC: 33.1 g/dL (ref 30.0–36.0)
MCV: 89.7 fl (ref 78.0–100.0)
Monocytes Absolute: 0.6 10*3/uL (ref 0.1–1.0)
Monocytes Relative: 12 % (ref 3.0–12.0)
Neutro Abs: 2.5 10*3/uL (ref 1.4–7.7)
Neutrophils Relative %: 50.5 % (ref 43.0–77.0)
Platelets: 235 10*3/uL (ref 150.0–400.0)
RBC: 4.48 Mil/uL (ref 3.87–5.11)
RDW: 13.7 % (ref 11.5–15.5)
WBC: 4.9 10*3/uL (ref 4.0–10.5)

## 2021-06-21 LAB — T4, FREE: Free T4: 0.75 ng/dL (ref 0.60–1.60)

## 2021-06-21 LAB — MAGNESIUM: Magnesium: 2.1 mg/dL (ref 1.5–2.5)

## 2021-06-21 LAB — HEMOGLOBIN A1C: Hgb A1c MFr Bld: 5.7 % (ref 4.6–6.5)

## 2021-06-21 LAB — TSH: TSH: 1.82 u[IU]/mL (ref 0.35–5.50)

## 2021-06-21 NOTE — Progress Notes (Signed)
Results normal  x  cholesterol  but  mostly favorable profile with high HDL   The 10-year ASCVD risk score (Arnett DK, et al., 2019) is: 4.9%   Values used to calculate the score:     Age: 66 years     Sex: Female     Is Non-Hispanic African American: No     Diabetic: No     Tobacco smoker: No     Systolic Blood Pressure: 292 mmHg     Is BP treated: No     HDL Cholesterol: 84.8 mg/dL     Total Cholesterol: 224 mg/dL

## 2021-06-26 DIAGNOSIS — Z1211 Encounter for screening for malignant neoplasm of colon: Secondary | ICD-10-CM | POA: Diagnosis not present

## 2021-06-30 ENCOUNTER — Ambulatory Visit: Payer: BC Managed Care – PPO | Admitting: Plastic Surgery

## 2021-06-30 ENCOUNTER — Other Ambulatory Visit: Payer: Self-pay

## 2021-06-30 ENCOUNTER — Encounter: Payer: Self-pay | Admitting: Plastic Surgery

## 2021-06-30 ENCOUNTER — Institutional Professional Consult (permissible substitution): Payer: BC Managed Care – PPO | Admitting: Plastic Surgery

## 2021-06-30 DIAGNOSIS — L989 Disorder of the skin and subcutaneous tissue, unspecified: Secondary | ICD-10-CM | POA: Insufficient documentation

## 2021-06-30 NOTE — Progress Notes (Signed)
Patient ID: Sierra [name], female    DOB: 1955-08-30, 66 y.o.   MRN: [id number]   Chief Complaint  Patient presents with   Advice Only   Skin Problem    The patient is a 66 year old female here for evaluation of 2 changing skin's.  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  She also had excision of a right arm nevus as a child.  She is here because she has had changing skin lesion in the right nasolabial fold and left forehead.  They are approximately 6 mm in size.  There is mild hyperpigmentation.  They have been changing and are more pronounced than they were last year.   Review of Systems  Constitutional: Negative.   HENT: Negative.    Eyes: Negative.   Respiratory: Negative.    Cardiovascular: Negative.   Gastrointestinal: Negative.   Endocrine: Negative.   Genitourinary: Negative.   Hematological: Negative.    Past Medical History:  Diagnosis Date   Acute lateral meniscus tear of right knee 05/03/2011   Allergic urticaria 08/06/2007   Qualifier: Diagnosis of  By: Sherren Mocha MD, Jory Ee    History of Doppler ultrasound    leg negative  2010 right   History of Doppler ultrasound    right leg 2010 neg   Meniere's disease    on diuretic therapy   Normal nuclear stress test    Myoview 2006    PHLEBITIS, LOWER EXTREMITY 09/17/2008   Qualifier: Diagnosis of  By: Sherlynn Stalls, CMA, Cindy     Tracheobronchopathia-osteochondroplastica 02/2010   involvement of trachea per FOB    Past Surgical History:  Procedure Laterality Date   BRONCHOSCOPY  10/11   BUNIONECTOMY WITH WEIL OSTEOTOMY Right 12/11/2018   Procedure: Right lapidus and modified McBride; 2nd metatarsal Weil and hammertoe correction;  Surgeon: Wylene Simmer, MD;  Location: [facility];  Service: Orthopedics;  Laterality: Right;  20min   DOPPLER ECHOCARDIOGRAPHY  2010   rt leg neg   KNEE ARTHROSCOPY  2003   right   KNEE ARTHROSCOPY  05/11/2011   Procedure: ARTHROSCOPY  KNEE;  Surgeon: Lorn Junes, MD;  Location: [facility];  Service: Orthopedics;  Laterality: Right;  with lateral meniscectomy   myoview  2006   stress- neg   SKIN GRAFT  1963      Current Outpatient Medications:    potassium chloride SA (KLOR-CON M15) 15 MEQ tablet, once a week., Disp: , Rfl:    triamterene-hydrochlorothiazide (MAXZIDE-25) 37.5-25 MG per tablet, Take 1 tablet by mouth once a week. Takes for Meniere's disease, Disp: , Rfl:    YUVAFEM 10 MCG TABS vaginal tablet, Place 1 tablet vaginally 2 (two) times a week., Disp: , Rfl:    Objective:   Vitals:   06/30/21 1115  BP: (!) 144/74  Pulse: 74  SpO2: 96%    Physical Exam Vitals reviewed.  Constitutional:      Appearance: Normal appearance.  HENT:     Head: Normocephalic and atraumatic.  Cardiovascular:     Rate and Rhythm: Normal rate.     Pulses: Normal pulses.  Pulmonary:     Effort: Pulmonary effort is normal.  Abdominal:     General: There is no distension.     Palpations: Abdomen is soft.  Skin:    Capillary Refill: Capillary refill takes less than 2 seconds.     Coloration: Skin is not jaundiced.  Findings: Lesion present. No bruising.  Neurological:     Mental Status: She is alert and oriented to person, place, and time.  Psychiatric:        Mood and Affect: Mood normal.        Behavior: Behavior normal.        Thought Content: Thought content normal.    Assessment & Plan:  Changing skin lesion  Recommend excision of right nasal labial lesion and forehead changing skin lesion.  Pictures were obtained of the patient and placed in the chart with the patient's or guardian's permission.   [place], DO

## 2021-07-03 LAB — COLOGUARD: COLOGUARD: NEGATIVE

## 2021-07-03 NOTE — Progress Notes (Signed)
Negative cologuard

## 2021-08-17 ENCOUNTER — Ambulatory Visit (INDEPENDENT_AMBULATORY_CARE_PROVIDER_SITE_OTHER): Payer: BC Managed Care – PPO

## 2021-08-17 DIAGNOSIS — Z23 Encounter for immunization: Secondary | ICD-10-CM

## 2021-08-21 NOTE — Progress Notes (Signed)
?Charlann Boxer D.O. ?[facility] ?[place] ?Phone: [phone] ?Subjective:   ?I, Jacqualin Combes, am serving as a scribe for Dr. Hulan Saas. ? ?This visit occurred during the SARS-CoV-2 public health emergency.  Safety protocols were in place, including screening questions prior to the visit, additional usage of staff PPE, and extensive cleaning of exam room while observing appropriate contact time as indicated for disinfecting solutions.  ?I'm seeing this patient by the request  of:  Panosh, Standley Brooking, MD ? ?CC: knee pain  ? ?CBS:[id number]  ?Sierra [name] is a 66 y.o. female coming in with complaint of L knee pain. Woke up in pain 2 weeks ago. Pain surrounds patella. Felt unstable when she was going down stairs. Pain has improved in past couple of days. Had injury to L knee in an accident years ago.  ? ?Patient was seen back in 2017 for what appeared to be more of patellofemoral arthritis and lateral tracking of the knee. ?  ? ?Past Medical History:  ?Diagnosis Date  ? Acute lateral meniscus tear of right knee 05/03/2011  ? Allergic urticaria 08/06/2007  ? Qualifier: Diagnosis of  By: Sherren Mocha MD, Jory Ee   ? History of Doppler ultrasound   ? leg negative  2010 right  ? History of Doppler ultrasound   ? right leg 2010 neg  ? Meniere's disease   ? on diuretic therapy  ? Normal nuclear stress test   ? Myoview 2006   ? PHLEBITIS, LOWER EXTREMITY 09/17/2008  ? Qualifier: Diagnosis of  By: Sherlynn Stalls, [place], [place]    ? Tracheobronchopathia-osteochondroplastica 02/2010  ? involvement of trachea per FOB  ? ?Past Surgical History:  ?Procedure Laterality Date  ? BRONCHOSCOPY  10/11  ? BUNIONECTOMY WITH WEIL OSTEOTOMY Right 12/11/2018  ? Procedure: Right lapidus and modified McBride; 2nd metatarsal Weil and hammertoe correction;  Surgeon: Wylene Simmer, MD;  Location: [place];  Service: Orthopedics;  Laterality: Right;  18mn  ? DOPPLER ECHOCARDIOGRAPHY  2010  ? rt leg neg  ?  KNEE ARTHROSCOPY  2003  ? right  ? KNEE ARTHROSCOPY  05/11/2011  ? Procedure: ARTHROSCOPY KNEE;  Surgeon: RLorn Junes MD;  Location[place]  Service: Orthopedics;  Laterality: Right;  with lateral meniscectomy  ? myoview  2006  ? stress- neg  ? SKIN GRAFT  1963  ? ?Social History  ? ?Socioeconomic History  ? Marital status: Married  ?  Spouse name: Not on file  ? Number of children: Not on file  ? Years of education: Not on file  ? Highest education level: Not on file  ?Occupational History  ? Not on file  ?Tobacco Use  ? Smoking status: Never  ? Smokeless tobacco: Never  ?Vaping Use  ? Vaping Use: Never used  ?Substance and Sexual Activity  ? Alcohol use: Yes  ?  Alcohol/week: 0.0 standard drinks  ?  Comment: socially 1-2 a month  ? Drug use: No  ? Sexual activity: Yes  ?  Birth control/protection: Post-menopausal  ?Other Topics Concern  ? Not on file  ?Social History Narrative  ? Regular Exercise- Yes  ? 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 ?Social Determinants of Health  ? ?Financial Resource Strain: Not on file  ?Food Insecurity: Not on file  ?Transportation Needs: Not on file  ?Physical Activity: Not on file  ?Stress: Not on file  ?Social Connections: Not on file  ? ?  Allergies  ?Allergen Reactions  ? Morphine Hives  ?  N/V  ? Latex Rash  ? Phosphoric Acid Hives  ? Tributyl Phosphate Hives  ? ?Family History  ?Problem Relation Age of Onset  ? Uterine cancer Mother   ?     sister  ? Asthma Father   ? Hyperlipidemia Father   ? Macular degeneration Father   ? Allergies Father   ? Prostate cancer Father   ? Multiple sclerosis Sister   ? Uterine cancer Sister   ? Thyroid cancer Daughter   ? ? ? ?Current Outpatient Medications (Cardiovascular):  ?  triamterene-hydrochlorothiazide (MAXZIDE-25) 37.5-25 MG per tablet, Take 1 tablet by mouth once a week. Takes for Meniere's disease ? ? ? ? ?Current Outpatient Medications (Other):  ?  potassium chloride SA (KLOR-CON M15) 15 MEQ tablet,  once a week. ?  YUVAFEM 10 MCG TABS vaginal tablet, Place 1 tablet vaginally 2 (two) times a week. ? ? ?Reviewed prior external information including notes and imaging from  ?primary care provider ?As well as notes that were available from care everywhere and other healthcare systems. ? ?Past medical history, social, surgical and family history all reviewed in electronic medical record.  No pertanent information unless stated regarding to the chief complaint.  ? ?Review of Systems: ? No headache, visual changes, nausea, vomiting, diarrhea, constipation, dizziness, abdominal pain, skin rash, fevers, chills, night sweats, weight loss, swollen lymph nodes, body aches, joint swelling, chest pain, shortness of breath, mood changes. POSITIVE muscle aches ? ?Objective  ?Blood pressure 122/82, pulse 64, height 5' 3.5" (1.613 m), SpO2 98 %. ?  ?General: No apparent distress alert and oriented x3 mood and affect normal, dressed appropriately.  ?HEENT: Pupils equal, extraocular movements intact  ?Respiratory: Patient's speak in full sentences and does not appear short of breath  ?Cardiovascular: No lower extremity edema, non tender, no erythema  ?Gait normal with good balance and coordination.  ?MSK: Left knee exam shows the patient does have relatively good range of motion.  Trace effusion noted.  Patient does have tenderness to palpation over the medial joint line.  Mild positive McMurray's. ? ?Limited muscular skeletal ultrasound was performed and interpreted by Hulan Saas, M  ?Limited ultrasound shows a trace effusion noted in the patellofemoral joint.  Medial meniscus has what appears to be a small tear with approximately 10 to 25% displacement noted mostly of the midline posteriorly.  Tear affects approximately 20% of the meniscus noted on the peripheral edge.  Lateral joint space appears to be unremarkable. ? ?  ?Impression and Recommendations:  ?  ? ?The above documentation has been reviewed and is accurate and  complete Lyndal Pulley, DO ? ? ? ?

## 2021-08-22 ENCOUNTER — Encounter: Payer: Self-pay | Admitting: Family Medicine

## 2021-08-22 ENCOUNTER — Other Ambulatory Visit: Payer: Self-pay

## 2021-08-22 ENCOUNTER — Ambulatory Visit: Payer: BC Managed Care – PPO | Admitting: Family Medicine

## 2021-08-22 DIAGNOSIS — M23204 Derangement of unspecified medial meniscus due to old tear or injury, left knee: Secondary | ICD-10-CM

## 2021-08-22 NOTE — Assessment & Plan Note (Signed)
Acute on chronic tear noted.  Discussed icing regimen ?

## 2021-08-22 NOTE — Patient Instructions (Addendum)
Do prescribed exercises at least 3x a week ?No twisting ? ?See you again in 5-6 weeks ? ?

## 2021-08-25 ENCOUNTER — Ambulatory Visit: Payer: BC Managed Care – PPO | Admitting: Plastic Surgery

## 2021-08-25 ENCOUNTER — Other Ambulatory Visit: Payer: Self-pay

## 2021-08-25 ENCOUNTER — Encounter: Payer: Self-pay | Admitting: Plastic Surgery

## 2021-08-25 ENCOUNTER — Other Ambulatory Visit (HOSPITAL_COMMUNITY)
Admission: RE | Admit: 2021-08-25 | Discharge: 2021-08-25 | Disposition: A | Discharge status: Home / Self Care | Payer: BC Managed Care – PPO | Source: Ambulatory Visit | Attending: Plastic Surgery | Admitting: Plastic Surgery

## 2021-08-25 VITALS — BP 145/80 | HR 71

## 2021-08-25 DIAGNOSIS — D2239 Melanocytic nevi of other parts of face: Secondary | ICD-10-CM | POA: Diagnosis not present

## 2021-08-25 DIAGNOSIS — L859 Epidermal thickening, unspecified: Secondary | ICD-10-CM | POA: Diagnosis not present

## 2021-08-25 DIAGNOSIS — L989 Disorder of the skin and subcutaneous tissue, unspecified: Secondary | ICD-10-CM | POA: Insufficient documentation

## 2021-08-25 NOTE — Progress Notes (Signed)
Procedure Note ? ?Preoperative Dx: changing skin lesion of forehead and right cheek ? ?Postoperative Dx: Same ? ?Procedure: Excision of changing skin lesion ?Forehead 3 mm ?Right cheek 6 mm ? ?Anesthesia: Lidocaine 1% with 1:100,000 epinephrine ? ?Indication for Procedure: skin lesions ? ?Description of Procedure: ?Risks and complications were explained to the patient.  Consent was confirmed and the patient understands the risks and benefits.  The potential complications and alternatives were explained and the patient consents.  The patient expressed understanding the option of not having the procedure and the risks of a scar.  Time out was called and all information was confirmed to be correct.   ? ?Forehead:  The area was prepped and drapped.  Lidocaine 1% with epinepherine was injected in the subcutaneous area.  After waiting several minutes for the local to take affect a #15 blade was used to excise the area in an eliptical pattern.  The skin edges were reapproximated with 6-0 Monocryl.   ? ?Right Cheek:  The area was prepped and drapped.  Lidocaine 1% with epinepherine was injected in the subcutaneous area.  After waiting several minutes for the local to take affect a #15 blade was used to excise the area in an eliptical pattern.  The skin edges were reapproximated with 6-0 Monocryl. A dressing was applied.  The patient was given instructions on how to care for the area and a follow up appointment.  Sierra [name] tolerated the procedure well and there were no complications. This specimen was sent to pathology.  The patient did not have concerns about the forehead lesion so it was not sent. ?

## 2021-08-28 ENCOUNTER — Telehealth: Payer: Self-pay | Admitting: *Deleted

## 2021-08-28 LAB — SURGICAL PATHOLOGY

## 2021-08-28 NOTE — Telephone Encounter (Signed)
Called and spoke with the patient and informed her of recent surgical pathology results.  Informed the patient that the surgical pathology was negative.  Patient verbalized understanding and agreed.//AB/CMA   ?

## 2021-08-28 NOTE — Telephone Encounter (Signed)
-----   Message from Wallace Going, DO sent at 08/28/2021 11:35 AM EDT ----- ?Please let pt know it is negative ?----- Message ----- ?From: Interface, [facility] ?Sent: 08/28/2021  11:16 AM EDT ?To: Loel Lofty Dillingham, DO ? ? ?

## 2021-08-31 ENCOUNTER — Other Ambulatory Visit: Payer: Self-pay

## 2021-08-31 ENCOUNTER — Ambulatory Visit: Payer: BC Managed Care – PPO | Admitting: Surgical

## 2021-08-31 DIAGNOSIS — L989 Disorder of the skin and subcutaneous tissue, unspecified: Secondary | ICD-10-CM

## 2021-08-31 NOTE — Progress Notes (Signed)
Patient is a 66 year old female here for follow-up after excision of changing skin lesion of forehead and right cheek. ? ?Surgical pathology of right cheek showed polypoid intradermal melanocytic nevus.  Negative for dysplasia and malignancy.  ? ?On exam forehead and cheek incisions are intact and healing well, Steri-Strips were removed.  Monocryl suture knots noted.  There is some surrounding irritation of the right cheek incision, but no cellulitic changes.  No tenderness with palpation.  No subcutaneous fluid collection noted.  No drainage noted.   ? ?Monocryl sutures were removed, patient tolerated this well.  New Steri-Strips were reapplied.  Once Steri-Strips have been removed in approximately 1 week, recommend continuing to use a daily sunscreen/facial moisturizer for UV protection of the incisions.  No signs of infection.  Follow-up as needed. ?

## 2021-09-04 ENCOUNTER — Encounter: Payer: Self-pay | Admitting: Plastic Surgery

## 2021-09-05 ENCOUNTER — Ambulatory Visit: Payer: BC Managed Care – PPO | Admitting: Plastic Surgery

## 2021-09-14 ENCOUNTER — Encounter: Payer: Self-pay | Admitting: Plastic Surgery

## 2021-09-19 DIAGNOSIS — S83242A Other tear of medial meniscus, current injury, left knee, initial encounter: Secondary | ICD-10-CM | POA: Diagnosis not present

## 2021-09-19 DIAGNOSIS — H00022 Hordeolum internum right lower eyelid: Secondary | ICD-10-CM | POA: Diagnosis not present

## 2021-09-20 ENCOUNTER — Encounter: Payer: Self-pay | Admitting: Internal Medicine

## 2021-09-20 ENCOUNTER — Ambulatory Visit: Payer: BC Managed Care – PPO | Admitting: Internal Medicine

## 2021-09-20 VITALS — BP 128/80 | HR 75 | Temp 98.0°F | Ht 63.6 in | Wt 155.0 lb

## 2021-09-20 DIAGNOSIS — R220 Localized swelling, mass and lump, head: Secondary | ICD-10-CM

## 2021-09-20 DIAGNOSIS — H019 Unspecified inflammation of eyelid: Secondary | ICD-10-CM

## 2021-09-20 MED ORDER — DOXYCYCLINE HYCLATE 100 MG PO TABS: 100.0000 mg | ORAL_TABLET | Freq: Two times a day (BID) | ORAL | 0 refills | Status: DC

## 2021-09-20 NOTE — Progress Notes (Signed)
dox ? ?Chief Complaint  ?Patient presents with  ? Eye Problem  ?  Right side facial pain swollen yesterday  ? ? ?HPI: ?Sierra [name] 66 y.o. come in for   eye lid sx and facial tenderness and swelling ?Recent onset of L lower lid stye which she has had before over days but became significantly swollen yesterday with tenderness over her cheekbone over to ear or jaw and tender AC gland.  Yesterday early saw her eye Dr. Gershon Crane who felt it was just a stye but could be followed up and gave some type of antibiotic ointment that she has used before using compresses.  No change in vision stye appears to be decreasing. ? ?Swelling and tenderness in face is improved from yesterday but she is traveling to [place] for 3 days and is concerned this will not continue to get better. ?No fever cold excessive sinus congestion no unusual rash or shingles sign and vision is still clear. ?She has had styes come and go before but nothing like this. ?ROS: See pertinent positives and negatives per HPI. ? ?Past Medical History:  ?Diagnosis Date  ? Acute lateral meniscus tear of right knee 05/03/2011  ? Allergic urticaria 08/06/2007  ? Qualifier: Diagnosis of  By: Sherren Mocha MD, Jory Ee   ? History of Doppler ultrasound   ? leg negative  2010 right  ? History of Doppler ultrasound   ? right leg 2010 neg  ? Meniere's disease   ? on diuretic therapy  ? Normal nuclear stress test   ? Myoview 2006   ? PHLEBITIS, LOWER EXTREMITY 09/17/2008  ? Qualifier: Diagnosis of  By: Sherlynn Stalls, [place], [place]    ? Tracheobronchopathia-osteochondroplastica 02/2010  ? involvement of trachea per FOB  ? ? ?Family History  ?Problem Relation Age of Onset  ? Uterine cancer Mother   ?     sister  ? Asthma Father   ? Hyperlipidemia Father   ? Macular degeneration Father   ? Allergies Father   ? Prostate cancer Father   ? Multiple sclerosis Sister   ? Uterine cancer Sister   ? Thyroid cancer Daughter   ? ? ?Social History  ? ?Socioeconomic History  ? Marital status: Married  ?   Spouse name: Not on file  ? Number of children: Not on file  ? Years of education: Not on file  ? Highest education level: Not on file  ?Occupational History  ? Not on file  ?Tobacco Use  ? Smoking status: Never  ? Smokeless tobacco: Never  ?Vaping Use  ? Vaping Use: Never used  ?Substance and Sexual Activity  ? Alcohol use: Yes  ?  Alcohol/week: 0.0 standard drinks  ?  Comment: socially 1-2 a month  ? Drug use: No  ? Sexual activity: Yes  ?  Birth control/protection: Post-menopausal  ?Other Topics Concern  ? Not on file  ?Social History Narrative  ? Regular Exercise- Yes  ? Occupation: Chief Executive Officer  ? HH of 2  ? 3 Children  ? ?Social Determinants of Health  ? ?Financial Resource Strain: Not on file  ?Food Insecurity: Not on file  ?Transportation Needs: Not on file  ?Physical Activity: Not on file  ?Stress: Not on file  ?Social Connections: Not on file  ? ? ?Outpatient Medications Prior to Visit  ?Medication Sig Dispense Refill  ? potassium chloride SA (KLOR-CON M15) 15 MEQ tablet once a week.    ? triamterene-hydrochlorothiazide (MAXZIDE-25) 37.5-25 MG per tablet Take 1 tablet by mouth  once a week. Takes for Meniere's disease    ? YUVAFEM 10 MCG TABS vaginal tablet Place 1 tablet vaginally 2 (two) times a week.    ? ?No facility-administered medications prior to visit.  ? ? ? ?EXAM: ? ?BP 128/80 (BP Location: Left Arm, Patient Position: Sitting, Cuff Size: Normal)   Pulse 75   Temp 98 ?F (36.7 ?C) (Oral)   Ht 5' 3.6" (1.615 m)   Wt 155 lb (70.3 kg)   SpO2 98%   BMI 26.94 kg/m?  ? ?Body mass index is 26.94 kg/m?. ? ?GENERAL: vitals reviewed and listed above, alert, oriented, appears well hydrated and in no acute distress ?HEENT: atraumatic, conjunctiva clear but lower lid on the right with small pustule stye right cheek +2 swelling slightly red and tender along zygoma and infraorbital ridge to jaw no obvious abnormalities on inspection of external nose and ears OP : no lesion edema or exudate TMs are clear EOMs are  intact neurologic nonfocal.  Nares patent ?NECK: no obvious masses on inspection palpation shotty nodes tender on right ? ?MS: moves all extremities without noticeable focal  abnormality ?PSYCH: pleasant and cooperative, no obvious depression or anxiety ? ?BP Readings from Last 3 Encounters:  ?09/20/21 128/80  ?08/25/21 (!) 145/80  ?08/22/21 122/82  ? ? ?ASSESSMENT AND PLAN: ? ?Discussed the following assessment and plan: ? ?Infection of eyelid ? ?Right facial swelling ?Typical stye right lower eyelid edge but with significant tenderness and facial swelling on the right unexpected although better today than yesterday.  No systemic symptoms. ?She is traveling out of country continue her compresses and topical antibiotic given by Dr. And add doxycycline 100 mg 1 p.o. twice daily x7 days. ?Follow-up with alarm symptoms or failure to resolve. ?-Patient advised to return or notify health care team  if  new concerns arise. ? ?Patient Instructions  ?Can add  oral antibiotic and continue local rx .  ? ?Have a good trip. Expect to improve   over next 48 hours .  ? ? ?Standley Brooking. Tammy Ericsson M.D. ?

## 2021-09-20 NOTE — Patient Instructions (Signed)
Can add  oral antibiotic and continue local rx .  ? ?Have a good trip. Expect to improve   over next 48 hours .  ?

## 2021-09-27 DIAGNOSIS — M25562 Pain in left knee: Secondary | ICD-10-CM | POA: Diagnosis not present

## 2021-09-28 DIAGNOSIS — S83242D Other tear of medial meniscus, current injury, left knee, subsequent encounter: Secondary | ICD-10-CM | POA: Diagnosis not present

## 2021-09-29 DIAGNOSIS — M7122 Synovial cyst of popliteal space [Baker], left knee: Secondary | ICD-10-CM | POA: Diagnosis not present

## 2021-09-29 DIAGNOSIS — M7052 Other bursitis of knee, left knee: Secondary | ICD-10-CM | POA: Diagnosis not present

## 2021-10-04 NOTE — Progress Notes (Signed)
?Sierra [name] D.O. ?[facility] ?[place] ?Phone: [phone] ?Subjective:   ?I, Sierra [name], am serving as a Education administrator for Dr. Hulan Saas. ?This visit occurred during the SARS-CoV-2 public health emergency.  Safety protocols were in place, including screening questions prior to the visit, additional usage of staff PPE, and extensive cleaning of exam room while observing appropriate contact time as indicated for disinfecting solutions.  ? ?I'm seeing this patient by the request  of:  Panosh, Standley Brooking, MD ? ?CC: Left knee pain ? ?[id number]  ?08/22/2021 ?Acute on chronic tear noted.  Discussed icing regimen ? ?Updated 10/05/2021 ?Sierra [name] is a 66 y.o. female coming in with complaint of left knee pain. Got an injection from MW and went into baker cyst. MRI with MW.  Patient did have the MRI done and I did find the report.  Patient does have a medial meniscal tear noted but no significant displacement.  And a small Baker's cyst.  Twisted knee last week.  Went in and had drained cyst Friday and possible strain or tear in calf. Sharp pain from calf to quad.  Patient states it is getting better but very slowly. ? ? ?  ? ?Past Medical History:  ?Diagnosis Date  ? Acute lateral meniscus tear of right knee 05/03/2011  ? Allergic urticaria 08/06/2007  ? Qualifier: Diagnosis of  By: Sherren Mocha MD, Jory Ee   ? History of Doppler ultrasound   ? leg negative  2010 right  ? History of Doppler ultrasound   ? right leg 2010 neg  ? Meniere's disease   ? on diuretic therapy  ? Normal nuclear stress test   ? Myoview 2006   ? PHLEBITIS, LOWER EXTREMITY 09/17/2008  ? Qualifier: Diagnosis of  By: Sherlynn Stalls, [place], [place]    ? Tracheobronchopathia-osteochondroplastica 02/2010  ? involvement of trachea per FOB  ? ?Past Surgical History:  ?Procedure Laterality Date  ? BRONCHOSCOPY  10/11  ? BUNIONECTOMY WITH WEIL OSTEOTOMY Right 12/11/2018  ? Procedure: Right lapidus and modified McBride; 2nd metatarsal  Weil and hammertoe correction;  Surgeon: Wylene Simmer, MD;  Location: [place] [facility];  Service: Orthopedics;  Laterality: Right;  32mn  ? DOPPLER ECHOCARDIOGRAPHY  2010  ? rt leg neg  ? KNEE ARTHROSCOPY  2003  ? right  ? KNEE ARTHROSCOPY  05/11/2011  ? Procedure: ARTHROSCOPY KNEE;  Surgeon: RLorn Junes MD;  [place]  Service: Orthopedics;  Laterality: Right;  with lateral meniscectomy  ? myoview  2006  ? stress- neg  ? SKIN GRAFT  1963  ? ?Social History  ? ?Socioeconomic History  ? Marital status: Married  ?  Spouse name: Not on file  ? Number of children: Not on file  ? Years of education: Not on file  ? Highest education level: Not on file  ?Occupational History  ? Not on file  ?Tobacco Use  ? Smoking status: Never  ? Smokeless tobacco: Never  ?Vaping Use  ? Vaping Use: Never used  ?Substance and Sexual Activity  ? Alcohol use: Yes  ?  Alcohol/week: 0.0 standard drinks  ?  Comment: socially 1-2 a month  ? Drug use: No  ? Sexual activity: Yes  ?  Birth control/protection: Post-menopausal  ?Other Topics Concern  ? Not on file  ?Social History Narrative  ? Regular Exercise- Yes  ? 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 ?Social Determinants of Health  ? ?  Financial Resource Strain: Not on file  ?Food Insecurity: Not on file  ?Transportation Needs: Not on file  ?Physical Activity: Not on file  ?Stress: Not on file  ?Social Connections: Not on file  ? ?Allergies  ?Allergen Reactions  ? Morphine Hives  ?  N/V  ? Latex Rash  ? Phosphoric Acid Hives  ? Tributyl Phosphate Hives  ? ?Family History  ?Problem Relation Age of Onset  ? Uterine cancer Mother   ?     sister  ? Asthma Father   ? Hyperlipidemia Father   ? Macular degeneration Father   ? Allergies Father   ? Prostate cancer Father   ? Multiple sclerosis Sister   ? Uterine cancer Sister   ? Thyroid cancer Daughter   ? ? ? ?Current Outpatient Medications (Cardiovascular):  ?  triamterene-hydrochlorothiazide  (MAXZIDE-25) 37.5-25 MG per tablet, Take 1 tablet by mouth once a week. Takes for Meniere's disease ? ? ? ? ?Current Outpatient Medications (Other):  ?  doxycycline (VIBRA-TABS) 100 MG tablet, Take 1 tablet (100 mg total) by mouth 2 (two) times daily. ?  potassium chloride SA (KLOR-CON M15) 15 MEQ tablet, once a week. ?  YUVAFEM 10 MCG TABS vaginal tablet, Place 1 tablet vaginally 2 (two) times a week. ? ? ?Reviewed prior external information including notes and imaging from  ?primary care provider ?As well as notes that were available from care everywhere and other healthcare systems. ? ?Past medical history, social, surgical and family history all reviewed in electronic medical record.  No pertanent information unless stated regarding to the chief complaint.  ? ?Review of Systems: ? No headache, visual changes, nausea, vomiting, diarrhea, constipation, dizziness, abdominal pain, skin rash, fevers, chills, night sweats, weight loss, swollen lymph nodes, body aches, joint swelling, chest pain, shortness of breath, mood changes. POSITIVE muscle aches ? ?Objective  ?Blood pressure (!) 150/90, pulse 82, height '5\' 3"'$  (1.6 m), SpO2 96 %. ?  ?General: No apparent distress alert and oriented x3 mood and affect normal, dressed appropriately.  ?HEENT: Pupils equal, extraocular movements intact  ?Respiratory: Patient's speak in full sentences and does not appear short of breath  ?Cardiovascular: No lower extremity edema, non tender, no erythema  ?Gait antalgic gait ?MSK: Left leg exam shows trace effusion noted of the patellofemoral joint.  Patient does have more tenderness to palpation in the popliteal area.  Patient though does have full range of motion.  Tender to palpation over the posterior medial joint line ? ?Limited muscular skeletal ultrasound was performed and interpreted by Hulan Saas, M  ?Limited ultrasound shows that patient does have some very mild hypoechoic changes of the posterior aspect of the knee.  It  appears that there is some hypoechoic changes in the soft tissues that is consistent with a potential ruptured cyst or muscle injury.  Patient's medial meniscus does have a tear noted on the periphery.  No significant displacement which is an improvement from previous exam.  No sign of any injury to the pes anserine. ?Impression: Ruptured Baker's cyst and medial meniscal tear ? ?  ?Impression and Recommendations:  ?  ? ?The above documentation has been reviewed and is accurate and complete Sierra Pulley, DO ? ? ? ?

## 2021-10-05 ENCOUNTER — Ambulatory Visit: Payer: Self-pay

## 2021-10-05 ENCOUNTER — Ambulatory Visit: Payer: BC Managed Care – PPO | Admitting: Family Medicine

## 2021-10-05 VITALS — BP 150/90 | HR 82 | Ht 63.0 in

## 2021-10-05 DIAGNOSIS — M23204 Derangement of unspecified medial meniscus due to old tear or injury, left knee: Secondary | ICD-10-CM

## 2021-10-05 DIAGNOSIS — M25562 Pain in left knee: Secondary | ICD-10-CM

## 2021-10-05 NOTE — Assessment & Plan Note (Addendum)
Patient on ultrasound today still has the medial meniscal tear but nondisplaced.  Appears to me that with the soft tissue changes patient actually has more of a ruptured Baker's cyst.  Likely with did contribute to more of the discomfort and pain.  Discussed with patient about icing regimen and home exercises otherwise.  Discussed which activities to do and which ones to avoid, discussed avoiding twisting motions.  I do not think at this point continues to be completely necessary.  Follow-up with me again 4 weeks ? ?We did discuss with patient traveling about the possibility of a Doppler to rule out anything such as a deep venous thrombosis but patient declined. ?

## 2021-10-05 NOTE — Patient Instructions (Signed)
Good to see you! ?Think it was a rupture baker cyst ?No twisting motion until after memorial day ?Okay to get on bike and go slow ?Knee compression during activity and ice after a lot of activity especially standing ?See you again in 4 weeks ?

## 2021-10-10 DIAGNOSIS — M7052 Other bursitis of knee, left knee: Secondary | ICD-10-CM | POA: Diagnosis not present

## 2021-10-31 DIAGNOSIS — S83242A Other tear of medial meniscus, current injury, left knee, initial encounter: Secondary | ICD-10-CM | POA: Diagnosis not present

## 2021-11-01 NOTE — Progress Notes (Signed)
[facility] [place] Phone: [phone] Subjective:   Sierra [name], am serving as a scribe for Dr. Hulan Saas.   I'm seeing this patient by the request  of:  Panosh, Standley Brooking, MD  CC: Left knee pain follow-up  [id number]  10/05/2021 Patient on ultrasound today still has the medial meniscal tear but nondisplaced.  Appears to me that with the soft tissue changes patient actually has more of a ruptured Baker's cyst.  Likely with did contribute to more of the discomfort and pain.  Discussed with patient about icing regimen and home exercises otherwise.  Discussed which activities to do and which ones to avoid, discussed avoiding twisting motions.  I do not think at this point continues to be completely necessary.  Follow-up with me again 4 weeks   We did discuss with patient traveling about the possibility of a Doppler to rule out anything such as a deep venous thrombosis but patient declined.  Updated 11/02/2021 Sierra [name] is a 66 y.o. female coming in with complaint of left leg and knee pain. Pain did get better and then got worse. At night she cannot get comfortable. Pain sitting or standing. Pain over anterio-medial aspect.  Patient states this seems to be worsening.  Sometimes feels like having some difficulty even putting weight on the knee again.  Patient did have a head injury after the MRI and does not know if something is going right or not.       Past Medical History:  Diagnosis Date   Acute lateral meniscus tear of right knee 05/03/2011   Allergic urticaria 08/06/2007   Qualifier: Diagnosis of  By: Sherren Mocha MD, Jory Ee    History of Doppler ultrasound    leg negative  2010 right   History of Doppler ultrasound    right leg 2010 neg   Meniere's disease    on diuretic therapy   Normal nuclear stress test    Myoview 2006    PHLEBITIS, LOWER EXTREMITY 09/17/2008   Qualifier: Diagnosis of  By: Sherlynn Stalls, CMA,  Cindy     Tracheobronchopathia-osteochondroplastica 02/2010   involvement of trachea per FOB   Past Surgical History:  Procedure Laterality Date   BRONCHOSCOPY  10/11   BUNIONECTOMY WITH WEIL OSTEOTOMY Right 12/11/2018   Procedure: Right lapidus and modified McBride; 2nd metatarsal Weil and hammertoe correction;  Surgeon: Wylene Simmer, MD;  Location: [place];  Service: Orthopedics;  Laterality: Right;  50mn   DOPPLER ECHOCARDIOGRAPHY  2010   rt leg neg   KNEE ARTHROSCOPY  2003   right   KNEE ARTHROSCOPY  05/11/2011   Procedure: ARTHROSCOPY KNEE;  Surgeon: RLorn Junes MD;  Location[place] [facility]  Service: Orthopedics;  Laterality: Right;  with lateral meniscectomy   myoview  2006   stress- neg   SKIN GRAFT  1963   Social History   Socioeconomic History   Marital status: Married    Spouse name: Not on file   Number of children: Not on file   Years of education: Not on file   Highest education level: Not on file  Occupational History   Not on file  Tobacco Use   Smoking status: Never   Smokeless tobacco: Never  Vaping Use   Vaping Use: Never used  Substance and Sexual Activity   Alcohol use: Yes    Alcohol/week: 0.0 standard drinks of alcohol    Comment: socially 1-2 a  month   Drug use: [name]   Sexual activity: Yes    Birth control/protection: Post-menopausal  Other Topics Concern   Not on file  Social History Narrative   Regular Exercise- Yes   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   Social Determinants of Health   Financial Resource Strain: Not on file  Food Insecurity: Not on file  Transportation Needs: Not on file  Physical Activity: Not on file  Stress: Not on file  Social Connections: Not on file   Allergies  Allergen Reactions   Morphine Hives    N/V   Latex Rash   Phosphoric Acid Hives   Tributyl Phosphate Hives   Family History  Problem Relation Age of Onset   Uterine cancer Mother        sister   Asthma  Father    Hyperlipidemia Father    Macular degeneration Father    Allergies Father    Prostate cancer Father    Multiple sclerosis Sister    Uterine cancer Sister    Thyroid cancer Daughter      Current Outpatient Medications (Cardiovascular):    triamterene-hydrochlorothiazide (MAXZIDE-25) 37.5-25 MG per tablet, Take 1 tablet by mouth once a week. Takes for Meniere's disease   Current Outpatient Medications (Analgesics):    meloxicam (MOBIC) 15 MG tablet, Take 1 tablet (15 mg total) by mouth daily.   Current Outpatient Medications (Other):    doxycycline (VIBRA-TABS) 100 MG tablet, Take 1 tablet (100 mg total) by mouth 2 (two) times daily.   potassium chloride SA (KLOR-CON M15) 15 MEQ tablet, once a week.   YUVAFEM 10 MCG TABS vaginal tablet, Place 1 tablet vaginally 2 (two) times a week.   Reviewed prior external information including notes and imaging from  primary care provider As well as notes that were available from care everywhere and other healthcare systems.  Past medical history, social, surgical and family history all reviewed in electronic medical record.  [name] pertanent information unless stated regarding to the chief complaint.   Review of Systems:  [name] headache, visual changes, nausea, vomiting, diarrhea, constipation, dizziness, abdominal pain, skin rash, fevers, chills, night sweats, weight loss, swollen lymph nodes, body aches, joint swelling, chest pain, shortness of breath, mood changes. POSITIVE muscle aches  Objective  Blood pressure 124/80, pulse 72, height '5\' 3"'$  (1.6 m), weight 155 lb (70.3 kg), SpO2 98 %.   General: [name] apparent distress alert and oriented x3 mood and affect normal, dressed appropriately.  HEENT: Pupils equal, extraocular movements intact  Respiratory: Patient's speak in full sentences and does not appear short of breath  Cardiovascular: [name] lower extremity edema, non tender, [name] erythema  Patient's left knee does have some swelling noted  over the medial joint space.  Severely tender to palpation over the distal medial condyle and the tibia itself.  Positive McMurray's noted.  Lacks the last 5 degrees of extension 10 degrees of flexion.  Limited muscular skeletal ultrasound was performed and interpreted by Hulan Saas, M   Limited ultrasound of patient's medial aspect of the knee has significant hypoechoic changes but seems to be more in the soft tissue.  Difficult to assess the MCL with this.  Patient does have increasing neovascularization in Doppler flow but does seem to be in the proximal tibia.  [name] true cortical irregularity noted, patient does have what appears to be displacement of the posterior medial meniscus. Impression: Cortical irregularity noted of the medial tibia, questionable acute on chronic  medial meniscal tear    Impression and Recommendations:     The above documentation has been reviewed and is accurate and complete Lyndal Pulley, DO

## 2021-11-02 ENCOUNTER — Encounter: Payer: Self-pay | Admitting: Family Medicine

## 2021-11-02 ENCOUNTER — Ambulatory Visit: Payer: Self-pay

## 2021-11-02 ENCOUNTER — Ambulatory Visit (INDEPENDENT_AMBULATORY_CARE_PROVIDER_SITE_OTHER): Payer: BC Managed Care – PPO

## 2021-11-02 ENCOUNTER — Ambulatory Visit: Payer: BC Managed Care – PPO | Admitting: Family Medicine

## 2021-11-02 VITALS — BP 124/80 | HR 72 | Ht 63.0 in | Wt 155.0 lb

## 2021-11-02 DIAGNOSIS — M23204 Derangement of unspecified medial meniscus due to old tear or injury, left knee: Secondary | ICD-10-CM

## 2021-11-02 DIAGNOSIS — M25562 Pain in left knee: Secondary | ICD-10-CM | POA: Diagnosis not present

## 2021-11-02 DIAGNOSIS — G8929 Other chronic pain: Secondary | ICD-10-CM | POA: Diagnosis not present

## 2021-11-02 DIAGNOSIS — M2550 Pain in unspecified joint: Secondary | ICD-10-CM

## 2021-11-02 LAB — CBC WITH DIFFERENTIAL/PLATELET
Basophils Absolute: 0 10*3/uL (ref 0.0–0.1)
Basophils Relative: 0.9 % (ref 0.0–3.0)
Eosinophils Absolute: 0.2 10*3/uL (ref 0.0–0.7)
Eosinophils Relative: 3.3 % (ref 0.0–5.0)
HCT: 41.6 % (ref 36.0–46.0)
Hemoglobin: 13.8 g/dL (ref 12.0–15.0)
Lymphocytes Relative: 30.4 % (ref 12.0–46.0)
Lymphs Abs: 1.5 10*3/uL (ref 0.7–4.0)
MCHC: 33 g/dL (ref 30.0–36.0)
MCV: 91.1 fl (ref 78.0–100.0)
Monocytes Absolute: 0.4 10*3/uL (ref 0.1–1.0)
Monocytes Relative: 8.1 % (ref 3.0–12.0)
Neutro Abs: 2.8 10*3/uL (ref 1.4–7.7)
Neutrophils Relative %: 57.3 % (ref 43.0–77.0)
Platelets: 286 10*3/uL (ref 150.0–400.0)
RBC: 4.57 Mil/uL (ref 3.87–5.11)
RDW: 14.3 % (ref 11.5–15.5)
WBC: 4.9 10*3/uL (ref 4.0–10.5)

## 2021-11-02 LAB — COMPREHENSIVE METABOLIC PANEL
ALT: 23 U/L (ref 0–35)
AST: 24 U/L (ref 0–37)
Albumin: 4.4 g/dL (ref 3.5–5.2)
Alkaline Phosphatase: 59 U/L (ref 39–117)
BUN: 17 mg/dL (ref 6–23)
CO2: 30 mEq/L (ref 19–32)
Calcium: 9.9 mg/dL (ref 8.4–10.5)
Chloride: 103 mEq/L (ref 96–112)
Creatinine, Ser: 0.62 mg/dL (ref 0.40–1.20)
GFR: 92.74 mL/min (ref >60.00)
Glucose, Bld: 90 mg/dL (ref 70–99)
Potassium: 3.8 mEq/L (ref 3.5–5.1)
Sodium: 141 mEq/L (ref 135–145)
Total Bilirubin: 0.4 mg/dL (ref 0.2–1.2)
Total Protein: 7.3 g/dL (ref 6.0–8.3)

## 2021-11-02 LAB — IBC PANEL
Iron: 86 ug/dL (ref 42–145)
Saturation Ratios: 24.6 % (ref 20.0–50.0)
TIBC: 350 ug/dL (ref 250.0–450.0)
Transferrin: 250 mg/dL (ref 212.0–360.0)

## 2021-11-02 LAB — VITAMIN D 25 HYDROXY (VIT D DEFICIENCY, FRACTURES): VITD: 43.72 ng/mL (ref 30.00–100.00)

## 2021-11-02 LAB — TSH: TSH: 1.57 u[IU]/mL (ref 0.35–5.50)

## 2021-11-02 LAB — URIC ACID: Uric Acid, Serum: 4.5 mg/dL (ref 2.4–7.0)

## 2021-11-02 LAB — SEDIMENTATION RATE: Sed Rate: 26 mm/hr (ref 0–30)

## 2021-11-02 LAB — VITAMIN B12: Vitamin B-12: 452 pg/mL (ref 211–911)

## 2021-11-02 LAB — C-REACTIVE PROTEIN: CRP: <1 mg/dL (ref 0.5–20.0)

## 2021-11-02 LAB — FERRITIN: Ferritin: 72.6 ng/mL (ref 10.0–291.0)

## 2021-11-02 MED ORDER — MELOXICAM 15 MG PO TABS: 15.0000 mg | ORAL_TABLET | Freq: Every day | ORAL | 0 refills | Status: DC

## 2021-11-02 NOTE — Patient Instructions (Addendum)
Labs Compression socks on plane Bring over disc once you have MRI Meloxicam '15mg'$   We will be in touch

## 2021-11-02 NOTE — Assessment & Plan Note (Signed)
Patient may have an acute on chronic tear noted but patient does have some soft tissue hypoechoic changes noted of the medial aspect of the knee.  This does seem to be over the joint line.  The patient also does have some increasing neovascularization going to the cortical area of the proximal tibia.  Patient is already scheduled for an MRI on Tuesday when she returns.  We will get a labs to make sure everything is okay at the moment.  Start anti-inflammatories which were prescribed as well.  Follow-up with me again after imaging to discuss further treatment options

## 2021-11-06 LAB — ANGIOTENSIN CONVERTING ENZYME: Angiotensin-Converting Enzyme: 23 U/L (ref 9–67)

## 2021-11-06 LAB — CALCIUM, IONIZED: Calcium, Ion: 5.3 mg/dL (ref 4.7–5.5)

## 2021-11-06 LAB — ANA: Anti Nuclear Antibody (ANA): POSITIVE — AB

## 2021-11-06 LAB — CYCLIC CITRUL PEPTIDE ANTIBODY, IGG: Cyclic Citrullin Peptide Ab: <16 UNITS

## 2021-11-06 LAB — D-DIMER, QUANTITATIVE: D-Dimer, Quant: 0.4 mcg/mL FEU (ref <0.50)

## 2021-11-06 LAB — PTH, INTACT AND CALCIUM
Calcium: 10 mg/dL (ref 8.6–10.4)
PTH: 44 pg/mL (ref 16–77)

## 2021-11-06 LAB — ANTI-NUCLEAR AB-TITER (ANA TITER): ANA Titer 1: 1:40 {titer} — ABNORMAL HIGH

## 2021-11-06 LAB — RHEUMATOID FACTOR: Rheumatoid fact SerPl-aCnc: <14 IU/mL (ref <14)

## 2021-11-08 DIAGNOSIS — M25562 Pain in left knee: Secondary | ICD-10-CM | POA: Diagnosis not present

## 2021-11-13 ENCOUNTER — Encounter: Payer: Self-pay | Admitting: Family Medicine

## 2021-11-14 ENCOUNTER — Ambulatory Visit (HOSPITAL_COMMUNITY)
Admission: RE | Admit: 2021-11-14 | Discharge: 2021-11-14 | Disposition: A | Discharge status: Home / Self Care | Payer: BC Managed Care – PPO | Source: Ambulatory Visit | Attending: Orthopedic Surgery | Admitting: Orthopedic Surgery

## 2021-11-14 ENCOUNTER — Other Ambulatory Visit (HOSPITAL_COMMUNITY): Payer: Self-pay | Admitting: Orthopedic Surgery

## 2021-11-14 DIAGNOSIS — R600 Localized edema: Secondary | ICD-10-CM | POA: Diagnosis not present

## 2021-11-17 DIAGNOSIS — M25562 Pain in left knee: Secondary | ICD-10-CM | POA: Diagnosis not present

## 2021-11-29 ENCOUNTER — Other Ambulatory Visit: Payer: Self-pay | Admitting: Family Medicine

## 2021-12-03 ENCOUNTER — Encounter: Payer: Self-pay | Admitting: Family Medicine

## 2021-12-04 DIAGNOSIS — S83242A Other tear of medial meniscus, current injury, left knee, initial encounter: Secondary | ICD-10-CM | POA: Diagnosis not present

## 2021-12-04 DIAGNOSIS — M84652A Pathological fracture in other disease, left femur, initial encounter for fracture: Secondary | ICD-10-CM | POA: Diagnosis not present

## 2021-12-04 DIAGNOSIS — M948X6 Other specified disorders of cartilage, lower leg: Secondary | ICD-10-CM | POA: Diagnosis not present

## 2021-12-04 DIAGNOSIS — X58XXXA Exposure to other specified factors, initial encounter: Secondary | ICD-10-CM | POA: Diagnosis not present

## 2021-12-04 DIAGNOSIS — Y999 Unspecified external cause status: Secondary | ICD-10-CM | POA: Diagnosis not present

## 2021-12-07 DIAGNOSIS — M6281 Muscle weakness (generalized): Secondary | ICD-10-CM | POA: Diagnosis not present

## 2021-12-07 DIAGNOSIS — S83232D Complex tear of medial meniscus, current injury, left knee, subsequent encounter: Secondary | ICD-10-CM | POA: Diagnosis not present

## 2021-12-07 DIAGNOSIS — R262 Difficulty in walking, not elsewhere classified: Secondary | ICD-10-CM | POA: Diagnosis not present

## 2021-12-07 DIAGNOSIS — M25662 Stiffness of left knee, not elsewhere classified: Secondary | ICD-10-CM | POA: Diagnosis not present

## 2021-12-12 DIAGNOSIS — M25662 Stiffness of left knee, not elsewhere classified: Secondary | ICD-10-CM | POA: Diagnosis not present

## 2021-12-12 DIAGNOSIS — S83232D Complex tear of medial meniscus, current injury, left knee, subsequent encounter: Secondary | ICD-10-CM | POA: Diagnosis not present

## 2021-12-12 DIAGNOSIS — M6281 Muscle weakness (generalized): Secondary | ICD-10-CM | POA: Diagnosis not present

## 2021-12-12 DIAGNOSIS — R262 Difficulty in walking, not elsewhere classified: Secondary | ICD-10-CM | POA: Diagnosis not present

## 2021-12-14 DIAGNOSIS — M6281 Muscle weakness (generalized): Secondary | ICD-10-CM | POA: Diagnosis not present

## 2021-12-14 DIAGNOSIS — M25662 Stiffness of left knee, not elsewhere classified: Secondary | ICD-10-CM | POA: Diagnosis not present

## 2021-12-14 DIAGNOSIS — R262 Difficulty in walking, not elsewhere classified: Secondary | ICD-10-CM | POA: Diagnosis not present

## 2021-12-14 DIAGNOSIS — S83232D Complex tear of medial meniscus, current injury, left knee, subsequent encounter: Secondary | ICD-10-CM | POA: Diagnosis not present

## 2021-12-19 DIAGNOSIS — S83232D Complex tear of medial meniscus, current injury, left knee, subsequent encounter: Secondary | ICD-10-CM | POA: Diagnosis not present

## 2021-12-19 DIAGNOSIS — M25662 Stiffness of left knee, not elsewhere classified: Secondary | ICD-10-CM | POA: Diagnosis not present

## 2021-12-19 DIAGNOSIS — R262 Difficulty in walking, not elsewhere classified: Secondary | ICD-10-CM | POA: Diagnosis not present

## 2021-12-19 DIAGNOSIS — M6281 Muscle weakness (generalized): Secondary | ICD-10-CM | POA: Diagnosis not present

## 2021-12-22 DIAGNOSIS — S83232D Complex tear of medial meniscus, current injury, left knee, subsequent encounter: Secondary | ICD-10-CM | POA: Diagnosis not present

## 2021-12-22 DIAGNOSIS — R262 Difficulty in walking, not elsewhere classified: Secondary | ICD-10-CM | POA: Diagnosis not present

## 2021-12-22 DIAGNOSIS — M6281 Muscle weakness (generalized): Secondary | ICD-10-CM | POA: Diagnosis not present

## 2021-12-22 DIAGNOSIS — M25662 Stiffness of left knee, not elsewhere classified: Secondary | ICD-10-CM | POA: Diagnosis not present

## 2021-12-26 DIAGNOSIS — M6281 Muscle weakness (generalized): Secondary | ICD-10-CM | POA: Diagnosis not present

## 2021-12-26 DIAGNOSIS — R262 Difficulty in walking, not elsewhere classified: Secondary | ICD-10-CM | POA: Diagnosis not present

## 2021-12-26 DIAGNOSIS — M25662 Stiffness of left knee, not elsewhere classified: Secondary | ICD-10-CM | POA: Diagnosis not present

## 2021-12-26 DIAGNOSIS — S83232D Complex tear of medial meniscus, current injury, left knee, subsequent encounter: Secondary | ICD-10-CM | POA: Diagnosis not present

## 2021-12-28 DIAGNOSIS — R262 Difficulty in walking, not elsewhere classified: Secondary | ICD-10-CM | POA: Diagnosis not present

## 2021-12-28 DIAGNOSIS — M25662 Stiffness of left knee, not elsewhere classified: Secondary | ICD-10-CM | POA: Diagnosis not present

## 2021-12-28 DIAGNOSIS — M6281 Muscle weakness (generalized): Secondary | ICD-10-CM | POA: Diagnosis not present

## 2021-12-28 DIAGNOSIS — S83232D Complex tear of medial meniscus, current injury, left knee, subsequent encounter: Secondary | ICD-10-CM | POA: Diagnosis not present

## 2022-01-04 DIAGNOSIS — M25662 Stiffness of left knee, not elsewhere classified: Secondary | ICD-10-CM | POA: Diagnosis not present

## 2022-01-04 DIAGNOSIS — S83232D Complex tear of medial meniscus, current injury, left knee, subsequent encounter: Secondary | ICD-10-CM | POA: Diagnosis not present

## 2022-01-04 DIAGNOSIS — R262 Difficulty in walking, not elsewhere classified: Secondary | ICD-10-CM | POA: Diagnosis not present

## 2022-01-04 DIAGNOSIS — M6281 Muscle weakness (generalized): Secondary | ICD-10-CM | POA: Diagnosis not present

## 2022-01-09 DIAGNOSIS — R262 Difficulty in walking, not elsewhere classified: Secondary | ICD-10-CM | POA: Diagnosis not present

## 2022-01-09 DIAGNOSIS — S83232D Complex tear of medial meniscus, current injury, left knee, subsequent encounter: Secondary | ICD-10-CM | POA: Diagnosis not present

## 2022-01-09 DIAGNOSIS — M25662 Stiffness of left knee, not elsewhere classified: Secondary | ICD-10-CM | POA: Diagnosis not present

## 2022-01-09 DIAGNOSIS — M6281 Muscle weakness (generalized): Secondary | ICD-10-CM | POA: Diagnosis not present

## 2022-01-11 DIAGNOSIS — S83232D Complex tear of medial meniscus, current injury, left knee, subsequent encounter: Secondary | ICD-10-CM | POA: Diagnosis not present

## 2022-01-11 DIAGNOSIS — M6281 Muscle weakness (generalized): Secondary | ICD-10-CM | POA: Diagnosis not present

## 2022-01-11 DIAGNOSIS — M25662 Stiffness of left knee, not elsewhere classified: Secondary | ICD-10-CM | POA: Diagnosis not present

## 2022-01-11 DIAGNOSIS — R262 Difficulty in walking, not elsewhere classified: Secondary | ICD-10-CM | POA: Diagnosis not present

## 2022-01-16 DIAGNOSIS — M25662 Stiffness of left knee, not elsewhere classified: Secondary | ICD-10-CM | POA: Diagnosis not present

## 2022-01-16 DIAGNOSIS — S83232D Complex tear of medial meniscus, current injury, left knee, subsequent encounter: Secondary | ICD-10-CM | POA: Diagnosis not present

## 2022-01-16 DIAGNOSIS — R262 Difficulty in walking, not elsewhere classified: Secondary | ICD-10-CM | POA: Diagnosis not present

## 2022-01-16 DIAGNOSIS — M6281 Muscle weakness (generalized): Secondary | ICD-10-CM | POA: Diagnosis not present

## 2022-01-23 DIAGNOSIS — M25562 Pain in left knee: Secondary | ICD-10-CM | POA: Diagnosis not present

## 2022-02-20 DIAGNOSIS — M25562 Pain in left knee: Secondary | ICD-10-CM | POA: Diagnosis not present

## 2022-02-20 DIAGNOSIS — S72433A Displaced fracture of medial condyle of unspecified femur, initial encounter for closed fracture: Secondary | ICD-10-CM | POA: Diagnosis not present

## 2022-02-20 DIAGNOSIS — M1712 Unilateral primary osteoarthritis, left knee: Secondary | ICD-10-CM | POA: Diagnosis not present

## 2022-02-20 DIAGNOSIS — Z791 Long term (current) use of non-steroidal anti-inflammatories (NSAID): Secondary | ICD-10-CM | POA: Diagnosis not present

## 2022-02-20 DIAGNOSIS — Z96659 Presence of unspecified artificial knee joint: Secondary | ICD-10-CM | POA: Diagnosis not present

## 2022-03-13 DIAGNOSIS — L821 Other seborrheic keratosis: Secondary | ICD-10-CM | POA: Diagnosis not present

## 2022-03-13 DIAGNOSIS — L814 Other melanin hyperpigmentation: Secondary | ICD-10-CM | POA: Diagnosis not present

## 2022-03-13 DIAGNOSIS — D225 Melanocytic nevi of trunk: Secondary | ICD-10-CM | POA: Diagnosis not present

## 2022-03-13 DIAGNOSIS — D2230 Melanocytic nevi of unspecified part of face: Secondary | ICD-10-CM | POA: Diagnosis not present

## 2022-03-28 ENCOUNTER — Ambulatory Visit (INDEPENDENT_AMBULATORY_CARE_PROVIDER_SITE_OTHER): Payer: BC Managed Care – PPO | Admitting: Family Medicine

## 2022-03-28 ENCOUNTER — Encounter: Payer: Self-pay | Admitting: Family Medicine

## 2022-03-28 VITALS — BP 136/70 | HR 74 | Temp 97.7°F | Ht 63.0 in | Wt 162.6 lb

## 2022-03-28 DIAGNOSIS — K120 Recurrent oral aphthae: Secondary | ICD-10-CM

## 2022-03-28 DIAGNOSIS — R059 Cough, unspecified: Secondary | ICD-10-CM

## 2022-03-28 LAB — POCT INFLUENZA A/B
Influenza A, POC: NEGATIVE
Influenza B, POC: NEGATIVE

## 2022-03-28 LAB — POC COVID19 BINAXNOW: SARS Coronavirus 2 Ag: NEGATIVE

## 2022-03-28 NOTE — Progress Notes (Signed)
Established Patient Office Visit  Subjective   Patient ID: Sierra [name], female    DOB: March 03, 1956  Age: 66 y.o. MRN: [id number]  Chief Complaint  Patient presents with   Cough    Patient complains of cough, x3 days, Productive cough    Nasal Congestion   Mouth Lesions    HPI   Sierra [name] is seen with about 3-day history of some nasal congestion and cough.  Her cough has become slightly productive.  She initially thought this may be allergy related.  She had some mild fatigue.  No fever.  She is getting ready to go to conference in [place] tomorrow and will to get checked out first.  She has been around grand child who had recent upper respiratory illness.  No dyspnea.  No obvious wheezing.  She has noted a couple small ulcers within her mouth.  No significant sore throat.  Past Medical History:  Diagnosis Date   Acute lateral meniscus tear of right knee 05/03/2011   Allergic urticaria 08/06/2007   Qualifier: Diagnosis of  By: Sierra Mocha MD, Sierra [name]    History of Doppler ultrasound    leg negative  2010 right   History of Doppler ultrasound    right leg 2010 neg   Meniere's disease    on diuretic therapy   Normal nuclear stress test    Myoview 2006    PHLEBITIS, LOWER EXTREMITY 09/17/2008   Qualifier: Diagnosis of  By: Sierra [name], CMA, Sierra [name]     Tracheobronchopathia-osteochondroplastica 02/2010   involvement of trachea per FOB   Past Surgical History:  Procedure Laterality Date   BRONCHOSCOPY  10/11   BUNIONECTOMY WITH WEIL OSTEOTOMY Right 12/11/2018   Procedure: Right lapidus and modified McBride; 2nd metatarsal Weil and hammertoe correction;  Surgeon: Sierra Simmer, MD;  [facility];  Service: Orthopedics;  Laterality: Right;  41mn   DOPPLER ECHOCARDIOGRAPHY  2010   rt leg neg   KNEE ARTHROSCOPY  2003   right   KNEE ARTHROSCOPY  05/11/2011   Procedure: ARTHROSCOPY KNEE;  Surgeon: Sierra Junes MD;  Location: [facility]  Service:  Orthopedics;  Laterality: Right;  with lateral meniscectomy   myoview  2006   stress- neg   SKIN GRAFT  1963    reports that she has never smoked. She has never used smokeless tobacco. She reports current alcohol use. She reports that she does not use drugs. family history includes Allergies in her father; Asthma in her father; Hyperlipidemia in her father; Macular degeneration in her father; Multiple sclerosis in her sister; Prostate cancer in her father; Thyroid cancer in her daughter; Uterine cancer in her mother and sister. Allergies  Allergen Reactions   Morphine Hives    N/V   Latex Rash   Phosphoric Acid Hives   Tributyl Phosphate Hives    Review of Systems  Constitutional:  Positive for malaise/fatigue. Negative for chills and fever.  HENT:  Positive for congestion.   Respiratory:  Positive for cough.   Cardiovascular:  Negative for chest pain.      Objective:     BP 136/70 (BP Location: Left Arm, Patient Position: Sitting, Cuff Size: Normal)   Pulse 74   Temp 97.7 F (36.5 C) (Oral)   Ht '5\' 3"'$  (1.6 m)   Wt 162 lb 9.6 oz (73.8 kg)   SpO2 98%   BMI 28.80 kg/m    Physical Exam Vitals reviewed.  Constitutional:      Appearance: Normal  appearance.  HENT:     Right Ear: Tympanic membrane normal.     Left Ear: Tympanic membrane normal.     Mouth/Throat:     Comments: Small aphthous ulcers 1 left buccal mucosa and 1 left lower gum region Cardiovascular:     Rate and Rhythm: Normal rate and regular rhythm.     Comments: Question of faint systolic murmur right upper sternal border Pulmonary:     Effort: Pulmonary effort is normal.     Breath sounds: Normal breath sounds. No wheezing or rales.  Musculoskeletal:     Cervical back: Neck supple.  Lymphadenopathy:     Cervical: No cervical adenopathy.  Neurological:     Mental Status: She is alert.      Results for orders placed or performed in visit on 03/28/22  POC COVID-19  Result Value Ref Range   SARS  Coronavirus 2 Ag Negative Negative  POC Influenza A/B  Result Value Ref Range   Influenza A, POC Negative Negative   Influenza B, POC Negative Negative      The 10-year ASCVD risk score (Arnett DK, et al., 2019) is: 6.2%    Assessment & Plan:   Problem List Items Addressed This Visit   None Visit Diagnoses     Cough, unspecified type    -  Primary   Relevant Orders   POC COVID-19 (Completed)   POC Influenza A/B (Completed)     Probable viral URI with cough.  Patient nontoxic in appearance.  She has a couple small aphthous ulcers which may be related.  COVID testing and influenza testing negative.  - Conservative treatment with fluids and rest -Offered cough suppression medicines but she declines at this time. -Follow-up promptly for any fever or worsening symptoms  No follow-ups on file.    Sierra Littler, MD

## 2022-03-28 NOTE — Addendum Note (Signed)
Addended by: Nilda Riggs on: 03/28/2022 03:37 PM   Modules accepted: Orders

## 2022-04-12 DIAGNOSIS — M25562 Pain in left knee: Secondary | ICD-10-CM | POA: Diagnosis not present

## 2022-05-23 DIAGNOSIS — M79671 Pain in right foot: Secondary | ICD-10-CM | POA: Diagnosis not present

## 2022-05-23 DIAGNOSIS — M7741 Metatarsalgia, right foot: Secondary | ICD-10-CM | POA: Diagnosis not present

## 2022-05-23 DIAGNOSIS — L84 Corns and callosities: Secondary | ICD-10-CM | POA: Diagnosis not present

## 2022-06-08 DIAGNOSIS — H8102 Meniere's disease, left ear: Secondary | ICD-10-CM | POA: Diagnosis not present

## 2022-06-08 DIAGNOSIS — H903 Sensorineural hearing loss, bilateral: Secondary | ICD-10-CM | POA: Diagnosis not present

## 2022-06-14 ENCOUNTER — Encounter: Payer: Self-pay | Admitting: Podiatry

## 2022-06-14 ENCOUNTER — Ambulatory Visit (INDEPENDENT_AMBULATORY_CARE_PROVIDER_SITE_OTHER): Payer: BC Managed Care – PPO

## 2022-06-14 ENCOUNTER — Ambulatory Visit (INDEPENDENT_AMBULATORY_CARE_PROVIDER_SITE_OTHER): Payer: BC Managed Care – PPO | Admitting: Podiatry

## 2022-06-14 DIAGNOSIS — M7751 Other enthesopathy of right foot: Secondary | ICD-10-CM

## 2022-06-14 DIAGNOSIS — M79674 Pain in right toe(s): Secondary | ICD-10-CM

## 2022-06-14 MED ORDER — TRIAMCINOLONE ACETONIDE 10 MG/ML IJ SUSP
10.0000 mg | Freq: Once | INTRAMUSCULAR | Status: AC
  Administered 2022-06-14: 10 mg

## 2022-06-15 ENCOUNTER — Other Ambulatory Visit: Payer: Self-pay | Admitting: Podiatry

## 2022-06-15 DIAGNOSIS — M79674 Pain in right toe(s): Secondary | ICD-10-CM

## 2022-06-15 DIAGNOSIS — M7751 Other enthesopathy of right foot: Secondary | ICD-10-CM

## 2022-06-15 DIAGNOSIS — M25562 Pain in left knee: Secondary | ICD-10-CM | POA: Diagnosis not present

## 2022-06-15 NOTE — Progress Notes (Signed)
Subjective:   Patient ID: Sierra [name], female   DOB: 67 y.o.   MRN: [id number]   HPI Patient states that she has developed intense discomfort in the second digit of her right foot over the last couple weeks and had started to use medicated corn pads which seem to make it worse.  Patient states it has been very sore and she has tried wider shoes and soaks and cushioning.  Patient does not smoke likes to be active   Review of Systems  All other systems reviewed and are negative.       Objective:  Physical Exam Vitals and nursing note reviewed.  Constitutional:      Appearance: She is well-developed.  Pulmonary:     Effort: Pulmonary effort is normal.  Musculoskeletal:        General: Normal range of motion.  Skin:    General: Skin is warm.  Neurological:     Mental Status: She is alert.     Neurovascular status intact muscle strength found to be within normal limits with patient found to have inflammation of the right second digit lateral side and into the third digit slight with the problem being mostly in the second toe with what appears to be fluid buildup proximal to the interphalangeal joint.  It is inflamed in this area there is no proximal edema erythema drainage noted patient's had previous foot surgery around this area first metatarsal and denies wanting x-rays today     Assessment:  Probability for inflammation and fluid buildup of the lateral second digit left around the inner phalangeal joint but more proximal localized with irritated type skin structure secondary to previous usage of medicated corn pads     Plan:  H&P reviewed condition and at this point I did a very small injection of the inflamed tissue with 2 mg dexamethasone 2 mg Xylocaine and then debrided tissue applied a separator to separate the second toe and third toe advised on soaks and that if the symptoms were to get worse we will get a need to rex-ray and cannot rule out infection but appears to be  more inflammatory.  Reappoint as needed

## 2022-06-23 ENCOUNTER — Emergency Department (HOSPITAL_BASED_OUTPATIENT_CLINIC_OR_DEPARTMENT_OTHER): Payer: BC Managed Care – PPO | Admitting: Radiology

## 2022-06-23 ENCOUNTER — Emergency Department (HOSPITAL_BASED_OUTPATIENT_CLINIC_OR_DEPARTMENT_OTHER)
Admission: EM | Admit: 2022-06-23 | Discharge: 2022-06-23 | Discharge status: Against Medical Advice | Payer: BC Managed Care – PPO | Attending: Emergency Medicine | Admitting: Emergency Medicine

## 2022-06-23 ENCOUNTER — Other Ambulatory Visit: Payer: Self-pay

## 2022-06-23 DIAGNOSIS — R079 Chest pain, unspecified: Secondary | ICD-10-CM | POA: Insufficient documentation

## 2022-06-23 DIAGNOSIS — R002 Palpitations: Secondary | ICD-10-CM | POA: Diagnosis not present

## 2022-06-23 LAB — CBC
HCT: 40 % (ref 36.0–46.0)
Hemoglobin: 13.2 g/dL (ref 12.0–15.0)
MCH: 29.4 pg (ref 26.0–34.0)
MCHC: 33 g/dL (ref 30.0–36.0)
MCV: 89.1 fL (ref 80.0–100.0)
Platelets: 238 10*3/uL (ref 150–400)
RBC: 4.49 MIL/uL (ref 3.87–5.11)
RDW: 13.2 % (ref 11.5–15.5)
WBC: 7.2 10*3/uL (ref 4.0–10.5)
nRBC: 0 % (ref 0.0–0.2)

## 2022-06-23 LAB — BASIC METABOLIC PANEL
Anion gap: 9 (ref 5–15)
BUN: 16 mg/dL (ref 8–23)
CO2: 26 mmol/L (ref 22–32)
Calcium: 9.7 mg/dL (ref 8.9–10.3)
Chloride: 105 mmol/L (ref 98–111)
Creatinine, Ser: 0.89 mg/dL (ref 0.44–1.00)
GFR, Estimated: >60 mL/min (ref >60)
Glucose, Bld: 107 mg/dL — ABNORMAL HIGH (ref 70–99)
Potassium: 3.6 mmol/L (ref 3.5–5.1)
Sodium: 140 mmol/L (ref 135–145)

## 2022-06-23 LAB — TROPONIN I (HIGH SENSITIVITY): Troponin I (High Sensitivity): <2 ng/L (ref <18)

## 2022-07-04 ENCOUNTER — Encounter: Payer: Self-pay | Admitting: Podiatry

## 2022-07-04 ENCOUNTER — Ambulatory Visit (INDEPENDENT_AMBULATORY_CARE_PROVIDER_SITE_OTHER): Payer: BC Managed Care – PPO | Admitting: Podiatry

## 2022-07-04 ENCOUNTER — Ambulatory Visit (INDEPENDENT_AMBULATORY_CARE_PROVIDER_SITE_OTHER): Payer: BC Managed Care – PPO

## 2022-07-04 ENCOUNTER — Telehealth: Payer: Self-pay | Admitting: Urology

## 2022-07-04 DIAGNOSIS — M7751 Other enthesopathy of right foot: Secondary | ICD-10-CM | POA: Diagnosis not present

## 2022-07-04 DIAGNOSIS — D169 Benign neoplasm of bone and articular cartilage, unspecified: Secondary | ICD-10-CM

## 2022-07-04 MED ORDER — DOXYCYCLINE HYCLATE 100 MG PO TABS: 100.0000 mg | ORAL_TABLET | Freq: Two times a day (BID) | ORAL | 1 refills | Status: DC

## 2022-07-04 NOTE — Progress Notes (Signed)
Subjective:   Patient ID: Sierra [name], female   DOB: 67 y.o.   MRN: [id number]   HPI Patient states the toe is doing great and then it flared back up again in the last week and it is very tender making it hard for me to sleep at night   ROS      Objective:  Physical Exam  Neurovascular status intact with inflammation pain of the second digit right on the proximal phalanx with patient having had previous surgery around 3-1/2 years ago and this is just developed recently     Assessment:  Probability for bone spur on the lateral side digit to right creating chronic discomfort between the 2 toes     Plan:  H&P reviewed and I do think given the intensity of discomfort failure to respond to padding injection and offloading that we just go ahead and remove the bone spur.  I explained the risk to the patient she wants to get this done and she read signed consent form understanding risk.  I did apply padding to try to separate the toes and just as precautionary measure we will get a put her on an antibiotic to start about 5 days before procedure doxycycline with all questions answered today.  Patient is encouraged to call questions concerns which may arise prior to procedure

## 2022-07-04 NOTE — Telephone Encounter (Signed)
DOS - 07/17/22  EXOSTECTOMY 2ND RIGHT --- 23009  [employer] EFFECTIVE DATE - 12/26/21    SPOKE WITH RAVEN P. WITH [employer] AND SHE STATED THAT FOR CPT CODE 79499 NO PRIOR AUTH IS REQUIRED.  REF # RAVEN P. 07/04/22 AT 11:19 AM EST

## 2022-07-09 ENCOUNTER — Encounter (HOSPITAL_BASED_OUTPATIENT_CLINIC_OR_DEPARTMENT_OTHER): Payer: Self-pay | Admitting: Cardiology

## 2022-07-09 ENCOUNTER — Ambulatory Visit (INDEPENDENT_AMBULATORY_CARE_PROVIDER_SITE_OTHER): Payer: BC Managed Care – PPO | Admitting: Cardiology

## 2022-07-09 VITALS — BP 124/70 | HR 70 | Ht 63.0 in | Wt 158.0 lb

## 2022-07-09 DIAGNOSIS — I479 Paroxysmal tachycardia, unspecified: Secondary | ICD-10-CM | POA: Diagnosis not present

## 2022-07-09 DIAGNOSIS — R052 Subacute cough: Secondary | ICD-10-CM | POA: Diagnosis not present

## 2022-07-09 DIAGNOSIS — R0789 Other chest pain: Secondary | ICD-10-CM | POA: Diagnosis not present

## 2022-07-09 DIAGNOSIS — Z7189 Other specified counseling: Secondary | ICD-10-CM

## 2022-07-09 NOTE — Progress Notes (Signed)
Cardiology Office Note:    Date:  07/09/2022   ID:  Sierra [name], DOB [birth year], MRN [id number]  PCP:  Burnis Medin, MD  Cardiologist:  Buford Dresser, MD  Referring MD: Burnis Medin, MD   Chief Complaint:  Follow up  History of Present Illness:    Sierra [name] is a 67 y.o. female with PMH Meneire's disease who is seen in follow up for palpitations/tachycardia. Initial visit 6.7.21.  Cardiac history: referred for palpitations/tachycardia 10/2019. Monitor done, sinus tachycardia, rare PACs/PVCs. No significant arrhythmias.   Today: Had a bad URI a few months ago, told she had a heart murmur. Reviewed note from Dr. Elease Hashimoto 03/28/22, noted question of faint systolic murmur RUSB.   A few weeks ago, she had a cold and noted she was having intermittent chest pain. Went to ER 06/23/22, LWBS. ECG was NSR, labs unremarkable, CXR unremarkable. Pain was more upper right side, would come and go. Had a cough at the same time but cough did not affect the pain. Has improved significant since cough resolved. No fevers/chills. Noted heart rate jumping around when she was sick as well.   Had LE edema after orthopedic procedures, improved with dry brushing.   Denies shortness of breath at rest or with normal exertion. No PND, orthopnea, or unexpected weight gain. No syncope.   Past Medical History:  Diagnosis Date   Acute lateral meniscus tear of right knee 05/03/2011   Allergic urticaria 08/06/2007   Qualifier: Diagnosis of  By: Sherren Mocha MD, Jory Ee    History of Doppler ultrasound    leg negative  2010 right   History of Doppler ultrasound    right leg 2010 neg   Meniere's disease    on diuretic therapy   Normal nuclear stress test    Myoview 2006    PHLEBITIS, LOWER EXTREMITY 09/17/2008   Qualifier: Diagnosis of  By: Sherlynn Stalls, CMA, Cindy     Tracheobronchopathia-osteochondroplastica 02/2010   involvement of trachea per FOB   Past Surgical History:  Procedure Laterality  Date   BRONCHOSCOPY  10/11   BUNIONECTOMY WITH WEIL OSTEOTOMY Right 12/11/2018   Procedure: Right lapidus and modified McBride; 2nd metatarsal Weil and hammertoe correction;  Surgeon: Wylene Simmer, MD;  Location: [facility];  Service: Orthopedics;  Laterality: Right;  21mn   DOPPLER ECHOCARDIOGRAPHY  2010   rt leg neg   KNEE ARTHROSCOPY  2003   right   KNEE ARTHROSCOPY  05/11/2011   Procedure: ARTHROSCOPY KNEE;  Surgeon: RLorn Junes MD;  Location: [facility]  Service: Orthopedics;  Laterality: Right;  with lateral meniscectomy   myoview  2006   stress- neg   SKIN GRAFT  1963     Current Meds  Medication Sig   triamterene-hydrochlorothiazide (MAXZIDE-25) 37.5-25 MG per tablet Take 1 tablet by mouth once a week. Takes for Meniere's disease   YUVAFEM 10 MCG TABS vaginal tablet Place 1 tablet vaginally 2 (two) times a week.     Allergies:   Morphine, Latex, Phosphoric acid, and Tributyl phosphate   Social History   Tobacco Use   Smoking status: Never   Smokeless tobacco: Never  Vaping Use   Vaping Use: Never used  Substance Use Topics   Alcohol use: Yes    Alcohol/week: 0.0 standard drinks of alcohol    Comment: socially 1-2 a month   Drug use: No     Family Hx: The patient's family history includes Allergies in her father;  Asthma in her father; Hyperlipidemia in her father; Macular degeneration in her father; Multiple sclerosis in her sister; Prostate cancer in her father; Thyroid cancer in her daughter; Uterine cancer in her mother and sister.  ROS:   Please see the history of present illness.    All other systems reviewed and are negative.   Prior CV studies:   The following studies were reviewed today: Monitor 11/05/19 14 days of data recorded on Zio monitor. Patient had a min HR of 47 bpm, max HR of 148 bpm, and avg HR of 74 bpm. Predominant underlying rhythm was Sinus Rhythm. No VT, atrial fibrillation, high degree block, or pauses  noted. Isolated atrial and ventricular ectopy was rare (<1%). There were two episodes of 4 consecutive PACs but no sustained SVT. There were 6 triggered events. These were predominantly sinus rhythm/sinus tachycardia, a few with PACs. No significant arrhythmias detected.   Labs/Other Tests and Data Reviewed:    EKG:  ECG personally reviewed. 06/23/22: NSR at 84 bpm.  Recent Labs: 11/02/2021: ALT 23; TSH 1.57 06/23/2022: BUN 16; Creatinine, Ser 0.89; Hemoglobin 13.2; Platelets 238; Potassium 3.6; Sodium 140   Recent Lipid Panel Lab Results  Component Value Date/Time   CHOL 224 (H) 06/21/2021 08:33 AM   TRIG 80.0 06/21/2021 08:33 AM   HDL 84.80 06/21/2021 08:33 AM   CHOLHDL 3 06/21/2021 08:33 AM   LDLCALC 124 (H) 06/21/2021 08:33 AM   LDLDIRECT 112.2 08/22/2012 12:13 PM    Objective:    Vital Signs:  BP 124/70   Pulse 70   Ht 5' 3"$  (1.6 m)   Wt 158 lb (71.7 kg)   BMI 27.99 kg/m    Wt Readings from Last 3 Encounters:  07/09/22 158 lb (71.7 kg)  03/28/22 162 lb 9.6 oz (73.8 kg)  11/02/21 155 lb (70.3 kg)   GEN: Well nourished, well developed in no acute distress HEENT: Normal, moist mucous membranes NECK: No JVD CARDIAC: regular rhythm, normal S1 and S2, no rubs or gallops. 1/6 systolic murmur RUSB. VASCULAR: Radial and DP pulses 2+ bilaterally. No carotid bruits RESPIRATORY:  Clear to auscultation without rales, wheezing or rhonchi  ABDOMEN: Soft, non-tender, non-distended MUSCULOSKELETAL:  Ambulates independently SKIN: Warm and dry, no edema NEUROLOGIC:  Alert and oriented x 3. No focal neuro deficits noted. PSYCHIATRIC:  Normal affect    ASSESSMENT & PLAN:    1. Atypical chest pain   2. Paroxysmal tachycardia (HCC)   3. Subacute cough   4. Cardiac risk counseling    Atypical chest pain Cough -recent URI, went to ER with reassuring hsTn, CXR, ECG -improving -reviewed red flag warning signs that need immediate medical attention  Paroxysmal tachycardia: -based  on monitor results, there are no significant arrhythmias -triggered symptomatic events are largely sinus, often sinus tach, with rare PACs -she will capture ECG on her Apple watch if this occurs again and send to me  CV risk counseling and primary prevention -recommend heart healthy/Mediterranean diet, with whole grains, fruits, vegetable, fish, lean meats, nuts, and olive oil. Limit salt. -recommend moderate walking, 3-5 times/week for 30-50 minutes each session. Aim for at least 150 minutes.week. Goal should be pace of 3 miles/hours, or walking 1.5 miles in 30 minutes -recommend avoidance of tobacco products. Avoid excess alcohol.  -ASCVD risk score: The 10-year ASCVD risk score (Arnett DK, et al., 2019) is: 5.9%   Values used to calculate the score:     Age: 22 years     Sex: Female  Is Non-Hispanic African American: No     Diabetic: No     Tobacco smoker: No     Systolic Blood Pressure: [id number] mmHg     Is BP treated: No     HDL Cholesterol: 84.8 mg/dL     Total Cholesterol: 224 mg/dL   Follow up: 2 years or sooner as needed  Buford Dresser, MD, PhD, [facility] at [facility] at [facility] [address] [place], [place] [address] [phone]   Medication Adjustments/Labs and Tests Ordered: Current medicines are reviewed at length with the patient today.  Concerns regarding medicines are outlined above.   Tests Ordered: No orders of the defined types were placed in this encounter.  Medication Changes: No orders of the defined types were placed in this encounter.   Patient Instructions  Medication Instructions:  The current medical regimen is effective;  continue present plan and medications.   *If you need a refill on your cardiac medications before your next appointment, please call your pharmacy*   Lab Work: None  Testing/Procedures: None   Follow-Up: At [facility], you and your health needs are our priority.  As part of our continuing mission to provide you with exceptional heart care, we have created designated Provider Care Teams.  These Care Teams include your primary Cardiologist (physician) and Advanced Practice Providers (APPs -  Physician Assistants and Nurse Practitioners) who all work together to provide you with the care you need, when you need it.  We recommend signing up for the patient portal called "MyChart".  Sign up information is provided on this After Visit Summary.  MyChart is used to connect with patients for Virtual Visits (Telemedicine).  Patients are able to view lab/test results, encounter notes, upcoming appointments, etc.  Non-urgent messages can be sent to your provider as well.   To learn more about what you can do with MyChart, go to [email or web address].    Your next appointment:   2 yrs    Provider:   Buford Dresser, MD    Other Instructions None   Signed, Buford Dresser, MD  07/09/2022   [place]

## 2022-07-09 NOTE — Patient Instructions (Signed)
Medication Instructions:  The current medical regimen is effective;  continue present plan and medications.   *If you need a refill on your cardiac medications before your next appointment, please call your pharmacy*   Lab Work: None  Testing/Procedures: None   Follow-Up: At [facility], you and your health needs are our priority.  As part of our continuing mission to provide you with exceptional heart care, we have created designated Provider Care Teams.  These Care Teams include your primary Cardiologist (physician) and Advanced Practice Providers (APPs -  Physician Assistants and Nurse Practitioners) who all work together to provide you with the care you need, when you need it.  We recommend signing up for the patient portal called "MyChart".  Sign up information is provided on this After Visit Summary.  MyChart is used to connect with patients for Virtual Visits (Telemedicine).  Patients are able to view lab/test results, encounter notes, upcoming appointments, etc.  Non-urgent messages can be sent to your provider as well.   To learn more about what you can do with MyChart, go to [email or web address].    Your next appointment:   2 yrs    Provider:   Buford Dresser, MD    Other Instructions None

## 2022-07-10 ENCOUNTER — Telehealth: Payer: Self-pay

## 2022-07-10 NOTE — Telephone Encounter (Signed)
Terina called to reschedule her surgery from 07/17/2022 to 07/31/2022. Notified Dr. Paulla Dolly and Caren Griffins with [facility]

## 2022-07-23 ENCOUNTER — Ambulatory Visit: Payer: BC Managed Care – PPO

## 2022-07-30 MED ORDER — HYDROCODONE-ACETAMINOPHEN 10-325 MG PO TABS: 1.0000 | ORAL_TABLET | Freq: Three times a day (TID) | ORAL | 0 refills | Status: AC | PRN

## 2022-07-30 NOTE — Addendum Note (Signed)
Addended by: Wallene Huh on: 07/30/2022 03:39 PM   Modules accepted: Orders

## 2022-07-31 ENCOUNTER — Encounter: Payer: Self-pay | Admitting: Podiatry

## 2022-07-31 DIAGNOSIS — M25774 Osteophyte, right foot: Secondary | ICD-10-CM | POA: Diagnosis not present

## 2022-07-31 DIAGNOSIS — M84871 Other disorders of continuity of bone, right ankle and foot: Secondary | ICD-10-CM | POA: Diagnosis not present

## 2022-08-06 ENCOUNTER — Ambulatory Visit (INDEPENDENT_AMBULATORY_CARE_PROVIDER_SITE_OTHER): Payer: BC Managed Care – PPO

## 2022-08-06 ENCOUNTER — Ambulatory Visit (INDEPENDENT_AMBULATORY_CARE_PROVIDER_SITE_OTHER): Payer: BC Managed Care – PPO | Admitting: Podiatry

## 2022-08-06 ENCOUNTER — Encounter: Payer: Self-pay | Admitting: Podiatry

## 2022-08-06 ENCOUNTER — Encounter: Payer: BC Managed Care – PPO | Admitting: Podiatry

## 2022-08-06 DIAGNOSIS — M7751 Other enthesopathy of right foot: Secondary | ICD-10-CM | POA: Diagnosis not present

## 2022-08-06 NOTE — Progress Notes (Signed)
Subjective:   Patient ID: Sierra [name], female   DOB: 67 y.o.   MRN: [id number]   HPI Patient states healing very well right foot   ROS      Objective:  Physical Exam  Neurovascular status intact negative Bevelyn Buckles' sign noted wound edges well coapted right second toe with good alignment of the digit     Assessment:  Doing well post removal of bone from the lateral side of the right second toe     Plan:  Reviewed x-ray Band-Aid applied continued wide shoes open toed shoes surgical shoe reappoint 2 weeks suture removal earlier if needed  X-rays indicate satisfactory resection of bone lateral side digit to right

## 2022-08-16 DIAGNOSIS — Z6827 Body mass index (BMI) 27.0-27.9, adult: Secondary | ICD-10-CM | POA: Diagnosis not present

## 2022-08-16 DIAGNOSIS — Z01419 Encounter for gynecological examination (general) (routine) without abnormal findings: Secondary | ICD-10-CM | POA: Diagnosis not present

## 2022-08-16 DIAGNOSIS — Z01411 Encounter for gynecological examination (general) (routine) with abnormal findings: Secondary | ICD-10-CM | POA: Diagnosis not present

## 2022-08-16 DIAGNOSIS — Z1231 Encounter for screening mammogram for malignant neoplasm of breast: Secondary | ICD-10-CM | POA: Diagnosis not present

## 2022-08-16 DIAGNOSIS — N952 Postmenopausal atrophic vaginitis: Secondary | ICD-10-CM | POA: Diagnosis not present

## 2022-08-20 ENCOUNTER — Other Ambulatory Visit: Payer: Self-pay | Admitting: Obstetrics & Gynecology

## 2022-08-20 ENCOUNTER — Ambulatory Visit: Payer: BC Managed Care – PPO

## 2022-08-20 DIAGNOSIS — Z13820 Encounter for screening for osteoporosis: Secondary | ICD-10-CM

## 2022-08-21 ENCOUNTER — Ambulatory Visit: Payer: BC Managed Care – PPO

## 2022-08-22 ENCOUNTER — Ambulatory Visit (INDEPENDENT_AMBULATORY_CARE_PROVIDER_SITE_OTHER): Payer: BC Managed Care – PPO | Admitting: Podiatry

## 2022-08-22 ENCOUNTER — Encounter: Payer: Self-pay | Admitting: Podiatry

## 2022-08-22 ENCOUNTER — Ambulatory Visit (INDEPENDENT_AMBULATORY_CARE_PROVIDER_SITE_OTHER): Payer: BC Managed Care – PPO

## 2022-08-22 DIAGNOSIS — D169 Benign neoplasm of bone and articular cartilage, unspecified: Secondary | ICD-10-CM

## 2022-08-22 DIAGNOSIS — Z9889 Other specified postprocedural states: Secondary | ICD-10-CM

## 2022-08-22 NOTE — Progress Notes (Signed)
Subjective:   Patient ID: Sierra [name], female   DOB: 67 y.o.   MRN: [id number]   HPI Patient states doing very well very happy with the procedure   ROS      Objective:  Physical Exam  Neurovascular status intact negative Bevelyn Buckles' sign noted wound edges coapted well stitches in place second toe in good alignment     Assessment:  Doing well post exostectomy digit to right     Plan:  Stitches removed wound edges coapted well final x-ray taken patient discharge may return to normal activities reappoint as needed  X-rays indicate satisfactory resection of bone on the lateral side of the joint second right

## 2022-08-23 DIAGNOSIS — Z139 Encounter for screening, unspecified: Secondary | ICD-10-CM | POA: Diagnosis not present

## 2022-08-24 LAB — LAB REPORT - SCANNED
A1c: 5.8
EGFR: 95

## 2022-10-19 ENCOUNTER — Ambulatory Visit (INDEPENDENT_AMBULATORY_CARE_PROVIDER_SITE_OTHER): Payer: BC Managed Care – PPO | Admitting: Family Medicine

## 2022-10-19 ENCOUNTER — Other Ambulatory Visit: Payer: Self-pay

## 2022-10-19 ENCOUNTER — Encounter: Payer: Self-pay | Admitting: Family Medicine

## 2022-10-19 DIAGNOSIS — M25562 Pain in left knee: Secondary | ICD-10-CM | POA: Diagnosis not present

## 2022-10-19 DIAGNOSIS — M25561 Pain in right knee: Secondary | ICD-10-CM

## 2022-10-19 MED ORDER — MELOXICAM 15 MG PO TABS: 15.0000 mg | ORAL_TABLET | Freq: Every day | ORAL | 0 refills | Status: DC

## 2022-10-19 MED ORDER — PREDNISONE 20 MG PO TABS: 40.0000 mg | ORAL_TABLET | Freq: Every day | ORAL | 0 refills | Status: DC

## 2022-10-19 NOTE — Assessment & Plan Note (Signed)
Likely exacerbation.  Patient be leaving town, I do not see any reason to do any type of injection today.  Patient given meloxicam and prednisone.  Warned to avoid full extension on the bicycle.  Follow-up with me again when she comes back in into the country in 2 to 3 weeks.  Patient given the meloxicam in prednisone for any wait-and-see.  Patient will be traveling with a sports medicine PA who will help her with this as well.  Discussed potential icing regimen.

## 2022-10-19 NOTE — Progress Notes (Signed)
[facility] [address] [place] [address] Phone: [phone] Subjective:    I'm seeing this patient by the request  of:  Panosh, Neta Mends, MD  CC: right knee pain   [id number]  Sierra [name] is a 67 y.o. female coming in with complaint of right knee exam.  Does have some tenderness.  Started out of nowhere.  Does not remember any injury.  Happened when she was sitting.  Felt like every 5 to 10 minutes was being stabbed with a knife.  Seems to be mostly under the kneecap.  Did not make any worsening of when increasing activity.  Has been working out on a more regular basis but mostly doing biking and walking.  Does not remember any twisting motions.  Did seem to help after having Aleve     Past Medical History:  Diagnosis Date   Acute lateral meniscus tear of right knee 05/03/2011   Allergic urticaria 08/06/2007   Qualifier: Diagnosis of  By: Tawanna Cooler MD, Eugenio Hoes    History of Doppler ultrasound    leg negative  2010 right   History of Doppler ultrasound    right leg 2010 neg   Meniere's disease    on diuretic therapy   Normal nuclear stress test    Myoview 2006    PHLEBITIS, LOWER EXTREMITY 09/17/2008   Qualifier: Diagnosis of  By: Linna Darner, CMA, Cindy     Tracheobronchopathia-osteochondroplastica 02/2010   involvement of trachea per FOB   Past Surgical History:  Procedure Laterality Date   BRONCHOSCOPY  10/11   BUNIONECTOMY WITH WEIL OSTEOTOMY Right 12/11/2018   Procedure: Right lapidus and modified McBride; 2nd metatarsal Weil and hammertoe correction;  Surgeon: Toni Arthurs, MD;  Location: [facility];  Service: Orthopedics;  Laterality: Right;    DOPPLER ECHOCARDIOGRAPHY  2010   rt leg neg   KNEE ARTHROSCOPY  2003   right   KNEE ARTHROSCOPY  05/11/2011   Procedure: ARTHROSCOPY KNEE;  Surgeon: Nilda Simmer, MD;  Location: [facility];  Service: Orthopedics;  Laterality: Right;  with lateral  meniscectomy   myoview  2006   stress- neg   SKIN GRAFT  1963   Social History   Socioeconomic History   Marital status: Married    Spouse name: Not on file   Number of children: Not on file   Years of education: Not on file   Highest education level: Not on file  Occupational History   Not on file  Tobacco Use   Smoking status: Never   Smokeless tobacco: Never  Vaping Use   Vaping Use: Never used  Substance and Sexual Activity   Alcohol use: Yes    Alcohol/week: 0.0 standard drinks of alcohol    Comment: socially 1-2 a month   Drug use: No   Sexual activity: Yes    Birth control/protection: Post-menopausal  Other Topics Concern   Not on file  Social History Narrative   Regular Exercise- Yes   Occupation: Clinical research associate   HH of 2   3 Children   Social Determinants of Health   Financial Resource Strain: Not on file  Food Insecurity: Not on file  Transportation Needs: Not on file  Physical Activity: Not on file  Stress: Not on file  Social Connections: Not on file   Allergies  Allergen Reactions   Morphine Hives    N/V   Latex Rash   Epinephrine     Other  Reaction(s): Unknown   Phosphoric Acid Hives   Tributyl Phosphate Hives   Family History  Problem Relation Age of Onset   Uterine cancer Mother        sister   Asthma Father    Hyperlipidemia Father    Macular degeneration Father    Allergies Father    Prostate cancer Father    Multiple sclerosis Sister    Uterine cancer Sister    Thyroid cancer Daughter     Current Outpatient Medications (Endocrine & Metabolic):    predniSONE (DELTASONE) 20 MG tablet, Take 2 tablets (40 mg total) by mouth daily with breakfast.  Current Outpatient Medications (Cardiovascular):    triamterene-hydrochlorothiazide (MAXZIDE-25) 37.5-25 MG per tablet, Take 1 tablet by mouth once a week. Takes for Meniere's disease   Current Outpatient Medications (Analgesics):    meloxicam (MOBIC) 15 MG tablet, Take 1 tablet (15 mg total)  by mouth daily.   Current Outpatient Medications (Other):    YUVAFEM 10 MCG TABS vaginal tablet, Place 1 tablet vaginally 2 (two) times a week.    Objective    General: No apparent distress alert and oriented x3 mood and affect normal, dressed appropriately.  HEENT: Pupils equal, extraocular movements intact  Respiratory: Patient's speak in full sentences and does not appear short of breath  Cardiovascular: No lower extremity edema, non tender, no erythema  Right knee exam shows that patient does have some mild crepitus noted.  Patient does have some mild pain with resisted flexion of the knee.  Negative McMurray's.  Nontender with palpation of the knee.  Limited muscular skeletal ultrasound was performed and interpreted by Antoine Primas, M Limited ultrasound shows the patient does have some mild hypoechoic changes of the patellofemoral joint noted. Right meniscus shows that there is some surgical differences noted especially the posterior medial aspect.  The lateral meniscus also has some surgical changes noted.  No significant displacement.  Mild scar tissue noted over the medial joint space but nothing severe. Impression: Patellofemoral arthritis with mild effusion    Impression and Recommendations:    The above documentation has been reviewed and is accurate and complete [name], DO

## 2022-12-10 ENCOUNTER — Ambulatory Visit (INDEPENDENT_AMBULATORY_CARE_PROVIDER_SITE_OTHER): Payer: BC Managed Care – PPO | Admitting: Family Medicine

## 2022-12-10 ENCOUNTER — Other Ambulatory Visit: Payer: Self-pay

## 2022-12-10 ENCOUNTER — Encounter: Payer: Self-pay | Admitting: Family Medicine

## 2022-12-10 VITALS — BP 102/68 | HR 76 | Ht 63.0 in

## 2022-12-10 DIAGNOSIS — M23204 Derangement of unspecified medial meniscus due to old tear or injury, left knee: Secondary | ICD-10-CM

## 2022-12-10 DIAGNOSIS — M25561 Pain in right knee: Secondary | ICD-10-CM | POA: Diagnosis not present

## 2022-12-10 DIAGNOSIS — M25562 Pain in left knee: Secondary | ICD-10-CM

## 2022-12-10 DIAGNOSIS — G8929 Other chronic pain: Secondary | ICD-10-CM

## 2022-12-10 MED ORDER — PREDNISONE 20 MG PO TABS: 40.0000 mg | ORAL_TABLET | Freq: Every day | ORAL | 0 refills | Status: DC

## 2022-12-10 NOTE — Progress Notes (Signed)
[facility] [address] [place] [address] Phone: [phone] Subjective:   [name], am serving as a scribe for Dr. Antoine Primas.  I'm seeing this patient by the request  of:  Panosh, Neta Mends, MD  CC: Seen 2 months ago for knee pain  [id number]  Sierra [name] is a 67 y.o. female coming in with complaint of knee pain.  At last exam did only have a small effusion and given meloxicam and prednisone.  Patient was to do home exercises.  Patient states that she started walking more in the woods for a competition at work. Developed pain in distal anterior quad. Is going on bike trip in [place] in a few weeks. Did try taking a couple days off of walking and her knee did feel somewhat better. Today walked in the woods and she did not have pain. Pain occurs after her walk and after sitting for a while. Hx of stress fx in L leg.        Past Medical History:  Diagnosis Date   Acute lateral meniscus tear of right knee 05/03/2011   Allergic urticaria 08/06/2007   Qualifier: Diagnosis of  By: Tawanna Cooler MD, Eugenio Hoes    History of Doppler ultrasound    leg negative  2010 right   History of Doppler ultrasound    right leg 2010 neg   Meniere's disease    on diuretic therapy   Normal nuclear stress test    Myoview 2006    PHLEBITIS, LOWER EXTREMITY 09/17/2008   Qualifier: Diagnosis of  By: Linna Darner, CMA, Cindy     Tracheobronchopathia-osteochondroplastica 02/2010   involvement of trachea per FOB   Past Surgical History:  Procedure Laterality Date   BRONCHOSCOPY  10/11   BUNIONECTOMY WITH WEIL OSTEOTOMY Right 12/11/2018   Procedure: Right lapidus and modified McBride; 2nd metatarsal Weil and hammertoe correction;  Surgeon: Toni Arthurs, MD;  Location: [facility];  Service: Orthopedics;  Laterality: Right;    DOPPLER ECHOCARDIOGRAPHY  2010   rt leg neg   KNEE ARTHROSCOPY  2003   right   KNEE ARTHROSCOPY  05/11/2011    Procedure: ARTHROSCOPY KNEE;  Surgeon: Nilda Simmer, MD;  Location: [facility];  Service: Orthopedics;  Laterality: Right;  with lateral meniscectomy   myoview  2006   stress- neg   SKIN GRAFT  1963   Social History   Socioeconomic History   Marital status: Married    Spouse name: Not on file   Number of children: Not on file   Years of education: Not on file   Highest education level: Not on file  Occupational History   Not on file  Tobacco Use   Smoking status: Never   Smokeless tobacco: Never  Vaping Use   Vaping status: Never Used  Substance and Sexual Activity   Alcohol use: Yes    Alcohol/week: 0.0 standard drinks of alcohol    Comment: socially 1-2 a month   Drug use: No   Sexual activity: Yes    Birth control/protection: Post-menopausal  Other Topics Concern   Not on file  Social History Narrative   Regular Exercise- Yes   Occupation: Clinical research associate   HH of 2   3 Children   Social Determinants of Health   Financial Resource Strain: Not on file  Food Insecurity: Not on file  Transportation Needs: Not on file  Physical Activity: Not on file  Stress: Not on file  Social Connections: Not on file   Allergies  Allergen Reactions   Morphine Hives    N/V   Latex Rash   Epinephrine     Other Reaction(s): Unknown   Phosphoric Acid Hives   Tributyl Phosphate Hives   Family History  Problem Relation Age of Onset   Uterine cancer Mother        sister   Asthma Father    Hyperlipidemia Father    Macular degeneration Father    Allergies Father    Prostate cancer Father    Multiple sclerosis Sister    Uterine cancer Sister    Thyroid cancer Daughter     Current Outpatient Medications (Endocrine & Metabolic):    predniSONE (DELTASONE) 20 MG tablet, Take 2 tablets (40 mg total) by mouth daily with breakfast.  Current Outpatient Medications (Cardiovascular):    triamterene-hydrochlorothiazide (MAXZIDE-25) 37.5-25 MG per tablet, Take 1 tablet by  mouth once a week. Takes for Meniere's disease   Current Outpatient Medications (Analgesics):    meloxicam (MOBIC) 15 MG tablet, Take 1 tablet (15 mg total) by mouth daily.   Current Outpatient Medications (Other):    YUVAFEM 10 MCG TABS vaginal tablet, Place 1 tablet vaginally 2 (two) times a week.   Reviewed prior external information including notes and imaging from  primary care provider As well as notes that were available from care everywhere and other healthcare systems.  Past medical history, social, surgical and family history all reviewed in electronic medical record.  No pertanent information unless stated regarding to the chief complaint.   Review of Systems:  No headache, visual changes, nausea, vomiting, diarrhea, constipation, dizziness, abdominal pain, skin rash, fevers, chills, night sweats, weight loss, swollen lymph nodes, body aches, joint swelling, chest pain, shortness of breath, mood changes. POSITIVE muscle aches  Objective  Blood pressure 102/68, pulse 76, height 5\' 3"  (1.6 m), SpO2 98%.   General: No apparent distress alert and oriented x3 mood and affect normal, dressed appropriately.  HEENT: Pupils equal, extraocular movements intact  Respiratory: Patient's speak in full sentences and does not appear short of breath  Cardiovascular: No lower extremity edema, non tender, no erythema  Knee exam shows left knee does have a trace effusion noted today.  Mild crepitus noted.  Lateral tracking of the patella noted.  Tender to palpation over the medial joint line  Limited muscular skeletal ultrasound was performed and interpreted by Antoine Primas, M  Limited ultrasound does show the patient does have hypoechoic changes noted.  Seems to be in the patellofemoral joint.  Medial meniscus still has the degenerative changes noted.  Very small Baker's cyst noted. Impression: Patellofemoral arthritis with effusion  After informed written and verbal consent, patient was  seated on exam table. Left knee was prepped with alcohol swab and utilizing anterolateral approach, patient's left knee space was injected with 4:1  marcaine 0.5%: Kenalog 40mg /dL. Patient tolerated the procedure well without immediate complications.    Impression and Recommendations:     The above documentation has been reviewed and is accurate and complete [name], DO

## 2022-12-10 NOTE — Assessment & Plan Note (Signed)
Injection given today and tolerated the procedure well.  Discussed icing regimen and home exercises.  Discussed which activities to do and which ones to avoid.  I do feel that there is underlying arthritis that could be contributing and could be a candidate for potential viscosupplementation.  Patient has already had surgical intervention previously.  Will need to monitor.  Follow-up again in 6 to 8 weeks.  Prednisone given for patient's bike ride.  Discussed staying on flat surfaces for now.

## 2022-12-10 NOTE — Patient Instructions (Addendum)
Prednisone 40mg  5 days Injection in knee See you after [place] (6-8 weeks)

## 2022-12-27 ENCOUNTER — Other Ambulatory Visit: Payer: Self-pay | Admitting: Internal Medicine

## 2022-12-29 MED ORDER — YUVAFEM 10 MCG VA TABS: 1.0000 | ORAL_TABLET | VAGINAL | 0 refills | Status: AC

## 2023-01-18 NOTE — Progress Notes (Unsigned)
[name] [address] [place] [address] Phone: [phone] Subjective:   Sierra [name], am serving as a scribe for Dr. Antoine [name].  I'm seeing this patient by the request  of:  Panosh, Sierra Mends, MD  CC: left knee   [id number]  12/10/2022 Injection given today and tolerated the procedure well. Discussed icing regimen and home exercises. Discussed which activities to do and which ones to avoid. I do feel that there is underlying arthritis that could be contributing and could be a candidate for potential viscosupplementation. Patient has already had surgical intervention previously. Will need to monitor. Follow-up again in 6 to 8 weeks. Prednisone given for patient's bike ride. Discussed staying on flat surfaces for now.   Update 01/22/2023 Sierra [name] is a 67 y.o. female coming in with complaint of L knee pain. Went to [place] and [place]. Patient states did great with doing steps and biking, but hiking took her knees out. Meloxicam helped. Today feeling pretty good.     Past Medical History:  Diagnosis Date   Acute lateral meniscus tear of right knee 05/03/2011   Allergic urticaria 08/06/2007   Qualifier: Diagnosis of  By: Tawanna Cooler MD, Eugenio Hoes    History of Doppler ultrasound    leg negative  2010 right   History of Doppler ultrasound    right leg 2010 neg   Meniere's disease    on diuretic therapy   Normal nuclear stress test    Myoview 2006    PHLEBITIS, LOWER EXTREMITY 09/17/2008   Qualifier: Diagnosis of  By: Linna Darner, CMA, Cindy     Tracheobronchopathia-osteochondroplastica 02/2010   involvement of trachea per FOB   Past Surgical History:  Procedure Laterality Date   BRONCHOSCOPY  10/11   BUNIONECTOMY WITH WEIL OSTEOTOMY Right 12/11/2018   Procedure: Right lapidus and modified McBride; 2nd metatarsal Weil and hammertoe correction;  Surgeon: Toni Arthurs, MD;  Location: [facility];  Service: Orthopedics;   Laterality: Right;    DOPPLER ECHOCARDIOGRAPHY  2010   rt leg neg   KNEE ARTHROSCOPY  2003   right   KNEE ARTHROSCOPY  05/11/2011   Procedure: ARTHROSCOPY KNEE;  Surgeon: Nilda Simmer, MD;  Location: [facility];  Service: Orthopedics;  Laterality: Right;  with lateral meniscectomy   myoview  2006   stress- neg   SKIN GRAFT  1963   Social History   Socioeconomic History   Marital status: Married    Spouse name: Not on file   Number of children: Not on file   Years of education: Not on file   Highest education level: Not on file  Occupational History   Not on file  Tobacco Use   Smoking status: Never   Smokeless tobacco: Never  Vaping Use   Vaping status: Never Used  Substance and Sexual Activity   Alcohol use: Yes    Alcohol/week: 0.0 standard drinks of alcohol    Comment: socially 1-2 a month   Drug use: No   Sexual activity: Yes    Birth control/protection: Post-menopausal  Other Topics Concern   Not on file  Social History Narrative   Regular Exercise- Yes   Occupation: Clinical research associate   HH of 2   3 Children   Social Determinants of Health   Financial Resource Strain: Not on file  Food Insecurity: Not on file  Transportation Needs: Not on file  Physical Activity: Not on file  Stress: Not on file  Social Connections: Not on file   Allergies  Allergen Reactions   Morphine Hives    N/V   Latex Rash   Epinephrine     Other Reaction(s): Unknown   Phosphoric Acid Hives   Tributyl Phosphate Hives   Family History  Problem Relation Age of Onset   Uterine cancer Mother        sister   Asthma Father    Hyperlipidemia Father    Macular degeneration Father    Allergies Father    Prostate cancer Father    Multiple sclerosis Sister    Uterine cancer Sister    Thyroid cancer Daughter     Current Outpatient Medications (Endocrine & Metabolic):    predniSONE (DELTASONE) 20 MG tablet, Take 2 tablets (40 mg total) by mouth daily with  breakfast.  Current Outpatient Medications (Cardiovascular):    triamterene-hydrochlorothiazide (MAXZIDE-25) 37.5-25 MG per tablet, Take 1 tablet by mouth once a week. Takes for Meniere's disease   Current Outpatient Medications (Analgesics):    meloxicam (MOBIC) 15 MG tablet, Take 1 tablet (15 mg total) by mouth daily.   Current Outpatient Medications (Other):    YUVAFEM 10 MCG TABS vaginal tablet, Place 1 tablet (10 mcg total) vaginally 2 (two) times a week.   Reviewed prior external information including notes and imaging from  primary care provider As well as notes that were available from care everywhere and other healthcare systems.  Past medical history, social, surgical and family history all reviewed in electronic medical record.  No pertanent information unless stated regarding to the chief complaint.   Review of Systems:  No headache, visual changes, nausea, vomiting, diarrhea, constipation, dizziness, abdominal pain, skin rash, fevers, chills, night sweats, weight loss, swollen lymph nodes, body aches, joint swelling, chest pain, shortness of breath, mood changes. POSITIVE muscle aches  Objective  Blood pressure 126/88, pulse 80, height 5\' 3"  (1.6 m), SpO2 96%.   General: No apparent distress alert and oriented x3 mood and affect normal, dressed appropriately.  HEENT: Pupils equal, extraocular movements intact  Respiratory: Patient's speak in full sentences and does not appear short of breath  Cardiovascular: No lower extremity edema, non tender, no erythema  Left knee exam shows patient does have some tenderness to palpation still noted over the medial joint line.  Still has an effusion noted of the left knee but significantly improved from previous exam. Limited muscular skeletal ultrasound was performed and interpreted by Sierra [name], M  Limited ultrasound does show that patient does have hypoechoic changes still noted in the patellofemoral area.  It is significant  improved though noted.   Impression and Recommendations:    The above documentation has been reviewed and is accurate and complete [name], DO

## 2023-01-22 ENCOUNTER — Ambulatory Visit (INDEPENDENT_AMBULATORY_CARE_PROVIDER_SITE_OTHER): Payer: BC Managed Care – PPO | Admitting: Family Medicine

## 2023-01-22 ENCOUNTER — Other Ambulatory Visit: Payer: Self-pay

## 2023-01-22 ENCOUNTER — Encounter: Payer: Self-pay | Admitting: Family Medicine

## 2023-01-22 VITALS — BP 126/88 | HR 80 | Ht 63.0 in

## 2023-01-22 DIAGNOSIS — M25562 Pain in left knee: Secondary | ICD-10-CM

## 2023-01-22 DIAGNOSIS — M25561 Pain in right knee: Secondary | ICD-10-CM

## 2023-01-22 DIAGNOSIS — G8929 Other chronic pain: Secondary | ICD-10-CM | POA: Diagnosis not present

## 2023-01-22 MED ORDER — MELOXICAM 15 MG PO TABS: 15.0000 mg | ORAL_TABLET | Freq: Every day | ORAL | 0 refills | Status: DC

## 2023-01-22 NOTE — Patient Instructions (Signed)
[facility] PT Knee Hip abductor strengthening Meloxicam 3 day burst when needed See you again in 2-3 months

## 2023-01-22 NOTE — Assessment & Plan Note (Signed)
Changes of the knee.  Has had difficulty over the course of time.  Discussed with patient about potential bracing, other repeating injections.  Patient given some VMO exercises and referred to physical therapy and personal training which I think will be the most beneficial to help with this as well as with hip strengthening to help with more stability as well.  Follow-up with me again in 6 to 8 weeks.

## 2023-01-22 NOTE — Addendum Note (Signed)
Addended by: Doristine Bosworth on: 01/22/2023 02:34 PM   Modules accepted: Orders

## 2023-02-20 ENCOUNTER — Other Ambulatory Visit: Payer: Self-pay | Admitting: Family Medicine

## 2023-03-28 NOTE — Progress Notes (Signed)
[facility] [address] [place] [address] Phone: [phone] Subjective:   [name], am serving as a scribe for Dr. Antoine Primas.  I'm seeing this patient by the request  of:  Panosh, Neta Mends, MD  CC: Left knee pain  [id number]  01/22/2023 Changes of the knee.  Has had difficulty over the course of time.  Discussed with patient about potential bracing, other repeating injections.  Patient given some VMO exercises and referred to physical therapy and personal training which I think will be the most beneficial to help with this as well as with hip strengthening to help with more stability as well.  Follow-up with me again in 6 to 8 weeks.    Update 04/03/2023 ATAYA MURDY is a 67 y.o. female coming in with complaint of L knee pain. Patient states that she has been walking more recently and when she sits down and gets back up she has pain and stiffness. Pain throughout the entire joint.     Past Medical History:  Diagnosis Date   Acute lateral meniscus tear of right knee 05/03/2011   Allergic urticaria 08/06/2007   Qualifier: Diagnosis of  By: Tawanna Cooler MD, Eugenio Hoes    History of Doppler ultrasound    leg negative  2010 right   History of Doppler ultrasound    right leg 2010 neg   Meniere's disease    on diuretic therapy   Normal nuclear stress test    Myoview 2006    PHLEBITIS, LOWER EXTREMITY 09/17/2008   Qualifier: Diagnosis of  By: Linna Darner, CMA, Cindy     Tracheobronchopathia-osteochondroplastica 02/2010   involvement of trachea per FOB   Past Surgical History:  Procedure Laterality Date   BRONCHOSCOPY  10/11   BUNIONECTOMY WITH WEIL OSTEOTOMY Right 12/11/2018   Procedure: Right lapidus and modified McBride; 2nd metatarsal Weil and hammertoe correction;  Surgeon: Toni Arthurs, MD;  Location: [facility];  Service: Orthopedics;  Laterality: Right;    DOPPLER ECHOCARDIOGRAPHY  2010   rt leg neg   KNEE  ARTHROSCOPY  2003   right   KNEE ARTHROSCOPY  05/11/2011   Procedure: ARTHROSCOPY KNEE;  Surgeon: Nilda Simmer, MD;  Location: [facility];  Service: Orthopedics;  Laterality: Right;  with lateral meniscectomy   myoview  2006   stress- neg   SKIN GRAFT  1963   Social History   Socioeconomic History   Marital status: Married    Spouse name: Not on file   Number of children: Not on file   Years of education: Not on file   Highest education level: Not on file  Occupational History   Not on file  Tobacco Use   Smoking status: Never   Smokeless tobacco: Never  Vaping Use   Vaping status: Never Used  Substance and Sexual Activity   Alcohol use: Yes    Alcohol/week: 0.0 standard drinks of alcohol    Comment: socially 1-2 a month   Drug use: No   Sexual activity: Yes    Birth control/protection: Post-menopausal  Other Topics Concern   Not on file  Social History Narrative   Regular Exercise- Yes   Occupation: Clinical research associate   HH of 2   3 Children   Social Determinants of Health   Financial Resource Strain: Not on file  Food Insecurity: Not on file  Transportation Needs: Not on file  Physical Activity: Not on file  Stress: Not on file  Social Connections: Not on file   Allergies  Allergen Reactions   Morphine Hives    N/V   Latex Rash   Epinephrine     Other Reaction(s): Unknown   Phosphoric Acid Hives   Tributyl Phosphate Hives   Family History  Problem Relation Age of Onset   Uterine cancer Mother        sister   Asthma Father    Hyperlipidemia Father    Macular degeneration Father    Allergies Father    Prostate cancer Father    Multiple sclerosis Sister    Uterine cancer Sister    Thyroid cancer Daughter     Current Outpatient Medications (Endocrine & Metabolic):    predniSONE (DELTASONE) 20 MG tablet, Take 2 tablets (40 mg total) by mouth daily with breakfast.  Current Outpatient Medications (Cardiovascular):     triamterene-hydrochlorothiazide (MAXZIDE-25) 37.5-25 MG per tablet, Take 1 tablet by mouth once a week. Takes for Meniere's disease   Current Outpatient Medications (Analgesics):    meloxicam (MOBIC) 15 MG tablet, TAKE 1 TABLET (15 MG TOTAL) BY MOUTH DAILY.   Current Outpatient Medications (Other):    YUVAFEM 10 MCG TABS vaginal tablet, Place 1 tablet (10 mcg total) vaginally 2 (two) times a week.   Reviewed prior external information including notes and imaging from  primary care provider As well as notes that were available from care everywhere and other healthcare systems.  Past medical history, social, surgical and family history all reviewed in electronic medical record.  No pertanent information unless stated regarding to the chief complaint.   Review of Systems:  No headache, visual changes, nausea, vomiting, diarrhea, constipation, dizziness, abdominal pain, skin rash, fevers, chills, night sweats, weight loss, swollen lymph nodes, body aches, joint swelling, chest pain, shortness of breath, mood changes. POSITIVE muscle aches  Objective  Blood pressure (!) 132/98, pulse 73, height 5\' 3"  (1.6 m), SpO2 100%.   General: No apparent distress alert and oriented x3 mood and affect normal, dressed appropriately.  HEENT: Pupils equal, extraocular movements intact  Respiratory: Patient's speak in full sentences and does not appear short of breath  Cardiovascular: No lower extremity edema, non tender, no erythema  Left knee exam shows the patient is tender to palpation of the medial joint line.  Severely over the Pez anserine area.  Tightness noted.   Limited muscular skeletal ultrasound was performed and interpreted by Antoine Primas, M  Limited ultrasound shows the patient does have hypoechoic changes in what appears to be more of a partial tearing of the hamstring tendon but no significant retraction.  Hypoechoic changes at the [employer] anserine.  No cortical irregularity noted at the  moment though of the tibia.  No significant effusion noted the patellofemoral joint   Impression and Recommendations:    The above documentation has been reviewed and is accurate and complete [name], DO

## 2023-04-03 ENCOUNTER — Ambulatory Visit: Payer: BC Managed Care – PPO | Admitting: Family Medicine

## 2023-04-03 ENCOUNTER — Encounter: Payer: Self-pay | Admitting: Family Medicine

## 2023-04-03 VITALS — BP 132/98 | HR 73 | Ht 63.0 in

## 2023-04-03 DIAGNOSIS — D225 Melanocytic nevi of trunk: Secondary | ICD-10-CM | POA: Diagnosis not present

## 2023-04-03 DIAGNOSIS — Z85828 Personal history of other malignant neoplasm of skin: Secondary | ICD-10-CM | POA: Diagnosis not present

## 2023-04-03 DIAGNOSIS — M76899 Other specified enthesopathies of unspecified lower limb, excluding foot: Secondary | ICD-10-CM

## 2023-04-03 DIAGNOSIS — L814 Other melanin hyperpigmentation: Secondary | ICD-10-CM | POA: Diagnosis not present

## 2023-04-03 DIAGNOSIS — L821 Other seborrheic keratosis: Secondary | ICD-10-CM | POA: Diagnosis not present

## 2023-04-03 NOTE — Assessment & Plan Note (Signed)
Partial tearing noted with what appears to be a pes anserine bursitis as well.  Discussed with patient about thigh compression, home exercises, heel lift, pulm exercises and eccentric strengthening.  Patient may start working with a trainer in the near future.  Discussed which activities to do and which ones to avoid.  Follow-up again in 6 to 8 weeks.

## 2023-04-03 NOTE — Patient Instructions (Addendum)
Pes anserine exercises Thigh compression sleeve Heel lift 1/8 inch to 16th of an inch Shorten stride See me in 6-8 weeks

## 2023-05-15 ENCOUNTER — Ambulatory Visit: Payer: BC Managed Care – PPO | Admitting: Family Medicine

## 2023-05-24 ENCOUNTER — Encounter: Payer: Self-pay | Admitting: Family Medicine

## 2023-05-24 ENCOUNTER — Ambulatory Visit (INDEPENDENT_AMBULATORY_CARE_PROVIDER_SITE_OTHER): Payer: BC Managed Care – PPO | Admitting: Family Medicine

## 2023-05-24 VITALS — BP 122/84 | HR 66 | Ht 63.0 in

## 2023-05-24 DIAGNOSIS — M19011 Primary osteoarthritis, right shoulder: Secondary | ICD-10-CM

## 2023-05-24 DIAGNOSIS — M19019 Primary osteoarthritis, unspecified shoulder: Secondary | ICD-10-CM | POA: Insufficient documentation

## 2023-05-24 NOTE — Patient Instructions (Signed)
Injected AC joint Voltaren gel See me again in 5 weeks

## 2023-05-24 NOTE — Progress Notes (Signed)
[facility] [address] [place] [address] Phone: [phone] Subjective:   [name], am serving as a scribe for Dr. Antoine Primas.  I'm seeing this patient by the request  of:  Panosh, Neta Mends, MD  CC: Right shoulder pain follow-up  [id number]  04/03/2023 Partial tearing noted with what appears to be a pes anserine bursitis as well.  Discussed with patient about thigh compression, home exercises, heel lift, pulm exercises and eccentric strengthening.  Patient may start working with a trainer in the near future.  Discussed which activities to do and which ones to avoid.  Follow-up again in 6 to 8 weeks.     Update 05/24/2023 Sierra [name] is a 67 y.o. female coming in with complaint of R shoulder pain. Painful to put bra or jacket on. Feels like she may have injured herself lifting. Denies any radiating symptoms.        Past Medical History:  Diagnosis Date   Acute lateral meniscus tear of right knee 05/03/2011   Allergic urticaria 08/06/2007   Qualifier: Diagnosis of  By: Tawanna Cooler MD, Eugenio Hoes    History of Doppler ultrasound    leg negative  2010 right   History of Doppler ultrasound    right leg 2010 neg   Meniere's disease    on diuretic therapy   Normal nuclear stress test    Myoview 2006    PHLEBITIS, LOWER EXTREMITY 09/17/2008   Qualifier: Diagnosis of  By: Linna Darner, CMA, Cindy     Tracheobronchopathia-osteochondroplastica 02/2010   involvement of trachea per FOB   Past Surgical History:  Procedure Laterality Date   BRONCHOSCOPY  10/11   BUNIONECTOMY WITH WEIL OSTEOTOMY Right 12/11/2018   Procedure: Right lapidus and modified McBride; 2nd metatarsal Weil and hammertoe correction;  Surgeon: Toni Arthurs, MD;  Location: [facility];  Service: Orthopedics;  Laterality: Right;    DOPPLER ECHOCARDIOGRAPHY  2010   rt leg neg   KNEE ARTHROSCOPY  2003   right   KNEE ARTHROSCOPY  05/11/2011   Procedure:  ARTHROSCOPY KNEE;  Surgeon: Nilda Simmer, MD;  Location: [facility];  Service: Orthopedics;  Laterality: Right;  with lateral meniscectomy   myoview  2006   stress- neg   SKIN GRAFT  1963   Social History   Socioeconomic History   Marital status: Married    Spouse name: Not on file   Number of children: Not on file   Years of education: Not on file   Highest education level: Not on file  Occupational History   Not on file  Tobacco Use   Smoking status: Never   Smokeless tobacco: Never  Vaping Use   Vaping status: Never Used  Substance and Sexual Activity   Alcohol use: Yes    Alcohol/week: 0.0 standard drinks of alcohol    Comment: socially 1-2 a month   Drug use: No   Sexual activity: Yes    Birth control/protection: Post-menopausal  Other Topics Concern   Not on file  Social History Narrative   Regular Exercise- Yes   Occupation: Clinical research associate   HH of 2   3 Children   Social Drivers of Corporate investment banker Strain: Not on file  Food Insecurity: Not on file  Transportation Needs: Not on file  Physical Activity: Not on file  Stress: Not on file  Social Connections: Not on file   Allergies  Allergen Reactions   Morphine  Hives    N/V   Latex Rash   Epinephrine     Other Reaction(s): Unknown   Phosphoric Acid Hives   Tributyl Phosphate Hives   Family History  Problem Relation Age of Onset   Uterine cancer Mother        sister   Asthma Father    Hyperlipidemia Father    Macular degeneration Father    Allergies Father    Prostate cancer Father    Multiple sclerosis Sister    Uterine cancer Sister    Thyroid cancer Daughter     Current Outpatient Medications (Endocrine & Metabolic):    predniSONE (DELTASONE) 20 MG tablet, Take 2 tablets (40 mg total) by mouth daily with breakfast.  Current Outpatient Medications (Cardiovascular):    triamterene-hydrochlorothiazide (MAXZIDE-25) 37.5-25 MG per tablet, Take 1 tablet by mouth once a week.  Takes for Meniere's disease   Current Outpatient Medications (Analgesics):    meloxicam (MOBIC) 15 MG tablet, TAKE 1 TABLET (15 MG TOTAL) BY MOUTH DAILY.   Current Outpatient Medications (Other):    YUVAFEM 10 MCG TABS vaginal tablet, Place 1 tablet (10 mcg total) vaginally 2 (two) times a week.   Reviewed prior external information including notes and imaging from  primary care provider As well as notes that were available from care everywhere and other healthcare systems.  Past medical history, social, surgical and family history all reviewed in electronic medical record.  No pertanent information unless stated regarding to the chief complaint.   Review of Systems:  No headache, visual changes, nausea, vomiting, diarrhea, constipation, dizziness, abdominal pain, skin rash, fevers, chills, night sweats, weight loss, swollen lymph nodes, body aches, joint swelling, chest pain, shortness of breath, mood changes. POSITIVE muscle aches  Objective  Blood pressure 122/84, pulse 66, height 5\' 3"  (1.6 m), SpO2 100%.   General: No apparent distress alert and oriented x3 mood and affect normal, dressed appropriately.  HEENT: Pupils equal, extraocular movements intact  Respiratory: Patient's speak in full sentences and does not appear short of breath  Cardiovascular: No lower extremity edema, non tender, no erythema  Right shoulder exam does have some tenderness noted.  Positive impingement noted, severely pain over the acromioclavicular joint and pain with crossover noted.   Limited muscular skeletal ultrasound was performed and interpreted by Antoine Primas, M  Limited ultrasound of patient's shoulder did show that there is some mild hypoechoic changes consistent with a bursitis noted in the subacromial area. Procedure: Real-time Ultrasound Guided Injection of right acromioclavicular joint Device: GE Logiq Q7 Ultrasound guided injection is preferred based studies that show increased  duration, increased effect, greater accuracy, decreased procedural pain, increased response rate, and decreased cost with ultrasound guided versus blind injection.  Verbal informed consent obtained.  Time-out conducted.  Noted no overlying erythema, induration, or other signs of local infection.  Skin prepped in a sterile fashion.  Local anesthesia: Topical Ethyl chloride.  With sterile technique and under real time ultrasound guidance: With a 25-gauge half inch needle injected with 0.5 cc of 0.5% Marcaine and 0.5 cc of Kenalog 40 mg/mL Completed without difficulty  Pain immediately resolved suggesting accurate placement of the medication.  Advised to call if fevers/chills, erythema, induration, drainage, or persistent bleeding.  Impression: Technically successful ultrasound guided injection.    Impression and Recommendations:     The above documentation has been reviewed and is accurate and complete [name], DO

## 2023-05-24 NOTE — Assessment & Plan Note (Signed)
Patient given injection and tolerated the procedure well, discussed icing regimen and home exercises, discussed icing regimen increase activity slowly.  Follow-up again in 6 to 8 weeks worsening pain will consider injection in the shoulder itself with some of the mild hypoechoic changes noted

## 2023-06-21 NOTE — Progress Notes (Unsigned)
[facility] [address] [place] [address] Phone: [phone] Subjective:   Sierra [name] am a scribe for Dr. Katrinka Blazing.  I'm seeing this patient by the request  of:  Panosh, Neta Mends, MD  CC: Right shoulder pain, left knee pain follow-up  [id number]  05/24/2023 Patient given injection and tolerated the procedure well, discussed icing regimen and home exercises, discussed icing regimen increase activity slowly.  Follow-up again in 6 to 8 weeks worsening pain will consider injection in the shoulder itself with some of the mild hypoechoic changes noted     Updated 06/25/2023 Sierra [name] is a 68 y.o. female coming in with complaint of shoulder pain, found to have acromioclavicular arthritis.  Given an injection a month ago.  Patient was to do home exercises.  Patient states knees are better but can still feel the tug in knees. Shoulder was doing great but tried an exercise class and it has been aching since (body weight  baring exercises). Taking off her bra and jacket causes pain in right shoulder. Would like to talk to Dr. Katrinka Blazing about both her shoulder and her knees.         Past Medical History:  Diagnosis Date   Acute lateral meniscus tear of right knee 05/03/2011   Allergic urticaria 08/06/2007   Qualifier: Diagnosis of  By: Tawanna Cooler MD, Eugenio Hoes    History of Doppler ultrasound    leg negative  2010 right   History of Doppler ultrasound    right leg 2010 neg   Meniere's disease    on diuretic therapy   Normal nuclear stress test    Myoview 2006    PHLEBITIS, LOWER EXTREMITY 09/17/2008   Qualifier: Diagnosis of  By: Linna Darner, CMA, Cindy     Tracheobronchopathia-osteochondroplastica 02/2010   involvement of trachea per FOB   Past Surgical History:  Procedure Laterality Date   BRONCHOSCOPY  10/11   BUNIONECTOMY WITH WEIL OSTEOTOMY Right 12/11/2018   Procedure: Right lapidus and modified McBride; 2nd metatarsal Weil and hammertoe  correction;  Surgeon: Toni Arthurs, MD;  Location: [facility];  Service: Orthopedics;  Laterality: Right;    DOPPLER ECHOCARDIOGRAPHY  2010   rt leg neg   KNEE ARTHROSCOPY  2003   right   KNEE ARTHROSCOPY  05/11/2011   Procedure: ARTHROSCOPY KNEE;  Surgeon: Nilda Simmer, MD;  Location: [facility];  Service: Orthopedics;  Laterality: Right;  with lateral meniscectomy   myoview  2006   stress- neg   SKIN GRAFT  1963   Social History   Socioeconomic History   Marital status: Married    Spouse name: Not on file   Number of children: Not on file   Years of education: Not on file   Highest education level: Not on file  Occupational History   Not on file  Tobacco Use   Smoking status: Never   Smokeless tobacco: Never  Vaping Use   Vaping status: Never Used  Substance and Sexual Activity   Alcohol use: Yes    Alcohol/week: 0.0 standard drinks of alcohol    Comment: socially 1-2 a month   Drug use: No   Sexual activity: Yes    Birth control/protection: Post-menopausal  Other Topics Concern   Not on file  Social History Narrative   Regular Exercise- Yes   Occupation: Lawyer   HH of 2   3 Children   Social Drivers of Dispensing optician  Resource Strain: Not on file  Food Insecurity: Not on file  Transportation Needs: Not on file  Physical Activity: Not on file  Stress: Not on file  Social Connections: Not on file   Allergies  Allergen Reactions   Morphine Hives    N/V   Latex Rash   Epinephrine     Other Reaction(s): Unknown   Phosphoric Acid Hives   Tributyl Phosphate Hives   Family History  Problem Relation Age of Onset   Uterine cancer Mother        sister   Asthma Father    Hyperlipidemia Father    Macular degeneration Father    Allergies Father    Prostate cancer Father    Multiple sclerosis Sister    Uterine cancer Sister    Thyroid cancer Daughter     Current Outpatient Medications (Endocrine & Metabolic):     predniSONE (DELTASONE) 20 MG tablet, Take 2 tablets (40 mg total) by mouth daily with breakfast.  Current Outpatient Medications (Cardiovascular):    triamterene-hydrochlorothiazide (MAXZIDE-25) 37.5-25 MG per tablet, Take 1 tablet by mouth once a week. Takes for Meniere's disease   Current Outpatient Medications (Analgesics):    meloxicam (MOBIC) 15 MG tablet, TAKE 1 TABLET (15 MG TOTAL) BY MOUTH DAILY.   Current Outpatient Medications (Other):    YUVAFEM 10 MCG TABS vaginal tablet, Place 1 tablet (10 mcg total) vaginally 2 (two) times a week.   Reviewed prior external information including notes and imaging from  primary care provider As well as notes that were available from care everywhere and other healthcare systems.  Past medical history, social, surgical and family history all reviewed in electronic medical record.  No pertanent information unless stated regarding to the chief complaint.   Review of Systems:  No headache, visual changes, nausea, vomiting, diarrhea, constipation, dizziness, abdominal pain, skin rash, fevers, chills, night sweats, weight loss, swollen lymph nodes, body aches, joint swelling, chest pain, shortness of breath, mood changes. POSITIVE muscle aches  Objective  Blood pressure (!) 140/90, pulse 71, height 5\' 3"  (1.6 m), SpO2 97%.   General: No apparent distress alert and oriented x3 mood and affect normal, dressed appropriately.  HEENT: Pupils equal, extraocular movements intact  Respiratory: Patient's speak in full sentences and does not appear short of breath  Cardiovascular: No lower extremity edema, non tender, no erythema  Right shoulder exam shows positive O'Brien's noted.  Positive crossover noted.  Rotator cuff strength does appear to be intact  Left knee exam shows minimal tenderness on exam.  Full range of motion noted  Limited muscular skeletal ultrasound was performed and interpreted by Antoine Primas, M  Regarding the area seems to be  angulated and lateral.  Meniscus appears to be trace effusion noted of the patellofemoral. Regarding the right shoulder does have some hypoechoic changes of the acromioclavicular joint.  Significant narrowing noted again. Impression: AC arthritis with worsening symptoms. Improvement in the knee.   Impression and Recommendations:     The above documentation has been reviewed and is accurate and complete [name], DO

## 2023-06-25 ENCOUNTER — Encounter: Payer: Self-pay | Admitting: Family Medicine

## 2023-06-25 ENCOUNTER — Other Ambulatory Visit: Payer: Self-pay

## 2023-06-25 ENCOUNTER — Ambulatory Visit (INDEPENDENT_AMBULATORY_CARE_PROVIDER_SITE_OTHER): Payer: BC Managed Care – PPO | Admitting: Family Medicine

## 2023-06-25 VITALS — BP 140/90 | HR 71 | Ht 63.0 in

## 2023-06-25 DIAGNOSIS — M19011 Primary osteoarthritis, right shoulder: Secondary | ICD-10-CM | POA: Diagnosis not present

## 2023-06-25 DIAGNOSIS — M25562 Pain in left knee: Secondary | ICD-10-CM | POA: Diagnosis not present

## 2023-06-25 DIAGNOSIS — M25561 Pain in right knee: Secondary | ICD-10-CM | POA: Diagnosis not present

## 2023-06-25 DIAGNOSIS — G8929 Other chronic pain: Secondary | ICD-10-CM

## 2023-06-25 NOTE — Assessment & Plan Note (Signed)
Continued inflammation and was doing well.  Can repeat injections if needed.  Discussed with patient to avoid certain activities.  Can consider injection again if necessary.  Follow-up again in 6 to 8 weeks.

## 2023-06-25 NOTE — Patient Instructions (Addendum)
Great to see you  Ice 20 minutes 2 times daily. Usually after activity and before bed. Voltaren topically  Meloxicam daily for 5 days   Hands within peripheral vision  See me agai in 4-5 weeks

## 2023-07-22 NOTE — Progress Notes (Unsigned)
 [facility] [address] [place] [address] Phone: [phone] Subjective:   [name] am a scribe for Dr. Katrinka Blazing.   I'm seeing this patient by the request  of:  Panosh, Neta Mends, MD  CC: Bilateral knee and right shoulder pain  [id number]  06/25/2023 Continued inflammation and was doing well. Can repeat injections if needed. Discussed with patient to avoid certain activities. Can consider injection again if necessary. Follow-up again in 6 to 8 weeks.   07/23/2023 Sierra [name] is a 68 y.o. female coming in with complaint of B knee and R shoulder pain. Patient states knees are better but still has a little pain when she sits too long. Shoulder is better but still achey.        Past Medical History:  Diagnosis Date   Acute lateral meniscus tear of right knee 05/03/2011   Allergic urticaria 08/06/2007   Qualifier: Diagnosis of  By: Tawanna Cooler MD, Eugenio Hoes    History of Doppler ultrasound    leg negative  2010 right   History of Doppler ultrasound    right leg 2010 neg   Meniere's disease    on diuretic therapy   Normal nuclear stress test    Myoview 2006    PHLEBITIS, LOWER EXTREMITY 09/17/2008   Qualifier: Diagnosis of  By: Linna Darner, CMA, Cindy     Tracheobronchopathia-osteochondroplastica 02/2010   involvement of trachea per FOB   Past Surgical History:  Procedure Laterality Date   BRONCHOSCOPY  10/11   BUNIONECTOMY WITH WEIL OSTEOTOMY Right 12/11/2018   Procedure: Right lapidus and modified McBride; 2nd metatarsal Weil and hammertoe correction;  Surgeon: Toni Arthurs, MD;  Location: [facility];  Service: Orthopedics;  Laterality: Right;    DOPPLER ECHOCARDIOGRAPHY  2010   rt leg neg   KNEE ARTHROSCOPY  2003   right   KNEE ARTHROSCOPY  05/11/2011   Procedure: ARTHROSCOPY KNEE;  Surgeon: Nilda Simmer, MD;  Location: [facility];  Service: Orthopedics;  Laterality: Right;  with lateral  meniscectomy   myoview  2006   stress- neg   SKIN GRAFT  1963   Social History   Socioeconomic History   Marital status: Married    Spouse name: Not on file   Number of children: Not on file   Years of education: Not on file   Highest education level: Not on file  Occupational History   Not on file  Tobacco Use   Smoking status: Never   Smokeless tobacco: Never  Vaping Use   Vaping status: Never Used  Substance and Sexual Activity   Alcohol use: Yes    Alcohol/week: 0.0 standard drinks of alcohol    Comment: socially 1-2 a month   Drug use: No   Sexual activity: Yes    Birth control/protection: Post-menopausal  Other Topics Concern   Not on file  Social History Narrative   Regular Exercise- Yes   Occupation: Clinical research associate   HH of 2   3 Children   Social Drivers of Corporate investment banker Strain: Not on file  Food Insecurity: Not on file  Transportation Needs: Not on file  Physical Activity: Not on file  Stress: Not on file  Social Connections: Not on file   Allergies  Allergen Reactions   Morphine Hives    N/V   Latex Rash   Epinephrine     Other Reaction(s): Unknown   Phosphoric Acid Hives   Tributyl  Phosphate Hives   Family History  Problem Relation Age of Onset   Uterine cancer Mother        sister   Asthma Father    Hyperlipidemia Father    Macular degeneration Father    Allergies Father    Prostate cancer Father    Multiple sclerosis Sister    Uterine cancer Sister    Thyroid cancer Daughter     Current Outpatient Medications (Endocrine & Metabolic):    predniSONE (DELTASONE) 20 MG tablet, Take 2 tablets (40 mg total) by mouth daily with breakfast.  Current Outpatient Medications (Cardiovascular):    triamterene-hydrochlorothiazide (MAXZIDE-25) 37.5-25 MG per tablet, Take 1 tablet by mouth once a week. Takes for Meniere's disease   Current Outpatient Medications (Analgesics):    meloxicam (MOBIC) 15 MG tablet, TAKE 1 TABLET (15 MG TOTAL)  BY MOUTH DAILY.   Current Outpatient Medications (Other):    YUVAFEM 10 MCG TABS vaginal tablet, Place 1 tablet (10 mcg total) vaginally 2 (two) times a week.   Reviewed prior external information including notes and imaging from  primary care provider As well as notes that were available from care everywhere and other healthcare systems.  Past medical history, social, surgical and family history all reviewed in electronic medical record.  No pertanent information unless stated regarding to the chief complaint.   Review of Systems:  No headache, visual changes, nausea, vomiting, diarrhea, constipation, dizziness, abdominal pain, skin rash, fevers, chills, night sweats, weight loss, swollen lymph nodes, body aches, joint swelling, chest pain, shortness of breath, mood changes. POSITIVE muscle aches  Objective  There were no vitals taken for this visit.   General: No apparent distress alert and oriented x3 mood and affect normal, dressed appropriately.  HEENT: Pupils equal, extraocular movements intact  Respiratory: Patient's speak in full sentences and does not appear short of breath  Cardiovascular: No lower extremity edema, non tender, no erythema  Knee exam shows mild crepitus but nothing severe. Shoulder exam shows tightness noted on the crossover test.  Pain over the acromioclavicular joint noted.  Procedure: Real-time Ultrasound Guided Injection of right glenohumeral joint Device: GE Logiq Q7  Ultrasound guided injection is preferred based studies that show increased duration, increased effect, greater accuracy, decreased procedural pain, increased response rate with ultrasound guided versus blind injection.  Verbal informed consent obtained.  Time-out conducted.  Noted no overlying erythema, induration, or other signs of local infection.  Skin prepped in a sterile fashion.  Local anesthesia: Topical Ethyl chloride.  With sterile technique and under real time ultrasound  guidance:  Joint visualized.  23g 1  inch needle inserted posterior approach. Pictures taken for needle placement. Patient did have injection of 2 cc of 0.5% Marcaine, and 1.0 cc of Kenalog 40 mg/dL. Completed without difficulty  Pain immediately improved Images saved in patient's Advised to call if fevers/chills, erythema, induration, drainage, or persistent bleeding.  Impression: Technically successful ultrasound guided injection.  Procedure: Real-time Ultrasound Guided Injection of right acromioclavicular joint Device: GE Logiq Q7 Ultrasound guided injection is preferred based studies that show increased duration, increased effect, greater accuracy, decreased procedural pain, increased response rate, and decreased cost with ultrasound guided versus blind injection.  Verbal informed consent obtained.  Time-out conducted.  Noted no overlying erythema, induration, or other signs of local infection.  Skin prepped in a sterile fashion.  Local anesthesia: Topical Ethyl chloride.  With sterile technique and under real time ultrasound guidance: With a 25-gauge half inch needle injected with 0.5  cc of 0.5% Marcaine and 0.5 cc of Kenalog 40 mg/mL Completed without difficulty  Pain immediately resolved suggesting accurate placement of the medication.  Advised to call if fevers/chills, erythema, induration, drainage, or persistent bleeding.  Impression: Technically successful ultrasound guided injection.   Impression and Recommendations:     The above documentation has been reviewed and is accurate and complete [name], DO

## 2023-07-23 ENCOUNTER — Encounter: Payer: Self-pay | Admitting: Family Medicine

## 2023-07-23 ENCOUNTER — Ambulatory Visit (INDEPENDENT_AMBULATORY_CARE_PROVIDER_SITE_OTHER): Payer: BC Managed Care – PPO | Admitting: Family Medicine

## 2023-07-23 ENCOUNTER — Other Ambulatory Visit: Payer: Self-pay

## 2023-07-23 VITALS — BP 130/70 | HR 73 | Ht 63.0 in

## 2023-07-23 DIAGNOSIS — M25562 Pain in left knee: Secondary | ICD-10-CM

## 2023-07-23 DIAGNOSIS — M19011 Primary osteoarthritis, right shoulder: Secondary | ICD-10-CM | POA: Diagnosis not present

## 2023-07-23 DIAGNOSIS — M25561 Pain in right knee: Secondary | ICD-10-CM | POA: Diagnosis not present

## 2023-07-23 DIAGNOSIS — M7551 Bursitis of right shoulder: Secondary | ICD-10-CM | POA: Insufficient documentation

## 2023-07-23 NOTE — Assessment & Plan Note (Signed)
 Injection given today, tolerated the procedure well, discussed icing regimen and home exercises, which activities to do and which ones to avoid.  Increase activity slowly.  Discussed icing regimen.  Follow-up again in 6 to 8 weeks.  Hopeful that this will make significant improvement though and patient will be able to start playing tennis on a more regular basis.

## 2023-07-23 NOTE — Assessment & Plan Note (Signed)
 Continues to have difficulty with both knees in the patellofemoral.  Discussed wearing the brace on a more regular basis even with her cooking for long durations of time.  Discussed icing regimen and home exercises otherwise.  Follow-up again in 6 to 8 weeks.

## 2023-07-23 NOTE — Patient Instructions (Addendum)
 Good to see you. Injections today. Give [place] an update on Monday. Return in 6 weeks.

## 2023-07-23 NOTE — Assessment & Plan Note (Signed)
 Patient given injection today and tolerated the procedure well, discussed icing regimen and home exercises.  Could be more of a reactive bursitis with patient compensating for the acromioclavicular joint.  Will see how patient responds.  Follow-up again in 6 weeks

## 2023-08-27 NOTE — Progress Notes (Signed)
 [facility] [address] [place] [address] Phone: [phone] Subjective:   Sierra [name] am a scribe for Dr. Katrinka Blazing.   I'm seeing this patient by the request  of:  Panosh, Neta Mends, MD  CC: Right knee more than left knee pain  [id number]  07/23/2023 Patient given injection today and tolerated the procedure well, discussed icing regimen and home exercises.  Could be more of a reactive bursitis with patient compensating for the acromioclavicular joint.  Will see how patient responds.  Follow-up again in 6 weeks    Continues to have difficulty with both knees in the patellofemoral. Discussed wearing the brace on a more regular basis even with her cooking for long durations of time. Discussed icing regimen and home exercises otherwise. Follow-up again in 6 to 8 weeks.  Injection given today, tolerated the procedure well, discussed icing regimen and home exercises, which activities to do and which ones to avoid.  Increase activity slowly.  Discussed icing regimen.  Follow-up again in 6 to 8 weeks.  Hopeful that this will make significant improvement though and patient will be able to start playing tennis on a more regular basis.     Updated 09/03/2023 Sierra [name] is a 68 y.o. female coming in with complaint of knee and shoulder pain. Patient states wants doctor to check the other knee today.  Right knee had jabbing pain when walking to the car one day out of nowhere. Could not walk on it for two days. Left knee and shoulder are ok today.        Past Medical History:  Diagnosis Date   Acute lateral meniscus tear of right knee 05/03/2011   Allergic urticaria 08/06/2007   Qualifier: Diagnosis of  By: Tawanna Cooler MD, Eugenio Hoes    History of Doppler ultrasound    leg negative  2010 right   History of Doppler ultrasound    right leg 2010 neg   Meniere's disease    on diuretic therapy   Normal nuclear stress test    Myoview 2006    PHLEBITIS, LOWER  EXTREMITY 09/17/2008   Qualifier: Diagnosis of  By: Linna Darner, CMA, Cindy     Tracheobronchopathia-osteochondroplastica 02/2010   involvement of trachea per FOB   Past Surgical History:  Procedure Laterality Date   BRONCHOSCOPY  10/11   BUNIONECTOMY WITH WEIL OSTEOTOMY Right 12/11/2018   Procedure: Right lapidus and modified McBride; 2nd metatarsal Weil and hammertoe correction;  Surgeon: Toni Arthurs, MD;  Location: [facility];  Service: Orthopedics;  Laterality: Right;    DOPPLER ECHOCARDIOGRAPHY  2010   rt leg neg   KNEE ARTHROSCOPY  2003   right   KNEE ARTHROSCOPY  05/11/2011   Procedure: ARTHROSCOPY KNEE;  Surgeon: Nilda Simmer, MD;  Location: [facility];  Service: Orthopedics;  Laterality: Right;  with lateral meniscectomy   myoview  2006   stress- neg   SKIN GRAFT  1963   Social History   Socioeconomic History   Marital status: Married    Spouse name: Not on file   Number of children: Not on file   Years of education: Not on file   Highest education level: Not on file  Occupational History   Not on file  Tobacco Use   Smoking status: Never   Smokeless tobacco: Never  Vaping Use   Vaping status: Never Used  Substance and Sexual Activity   Alcohol use: Yes    Alcohol/week:  0.0 standard drinks of alcohol    Comment: socially 1-2 a month   Drug use: No   Sexual activity: Yes    Birth control/protection: Post-menopausal  Other Topics Concern   Not on file  Social History Narrative   Regular Exercise- Yes   Occupation: Clinical research associate   HH of 2   3 Children   Social Drivers of Corporate investment banker Strain: Not on file  Food Insecurity: Not on file  Transportation Needs: Not on file  Physical Activity: Not on file  Stress: Not on file  Social Connections: Not on file   Allergies  Allergen Reactions   Morphine Hives    N/V   Latex Rash   Epinephrine     Other Reaction(s): Unknown   Phosphoric Acid Hives   Tributyl  Phosphate Hives   Family History  Problem Relation Age of Onset   Uterine cancer Mother        sister   Asthma Father    Hyperlipidemia Father    Macular degeneration Father    Allergies Father    Prostate cancer Father    Multiple sclerosis Sister    Uterine cancer Sister    Thyroid cancer Daughter     Current Outpatient Medications (Endocrine & Metabolic):    predniSONE (DELTASONE) 20 MG tablet, Take 2 tablets (40 mg total) by mouth daily with breakfast.  Current Outpatient Medications (Cardiovascular):    triamterene-hydrochlorothiazide (MAXZIDE-25) 37.5-25 MG per tablet, Take 1 tablet by mouth once a week. Takes for Meniere's disease   Current Outpatient Medications (Analgesics):    meloxicam (MOBIC) 15 MG tablet, TAKE 1 TABLET (15 MG TOTAL) BY MOUTH DAILY.   Current Outpatient Medications (Other):    Estradiol (YUVAFEM) 10 MCG TABS vaginal tablet, INSERT 1 TABLET INTO THE VAGINA EVERY NIGHT FOR 1 WEEK THEN CONTINUE WITH 1 TABLET THREE TIMES A WEEK. REPEAT THIS REGIMEN EVERY THREE MONTHS.   YUVAFEM 10 MCG TABS vaginal tablet, Place 1 tablet (10 mcg total) vaginally 2 (two) times a week.   Reviewed prior external information including notes and imaging from  primary care provider As well as notes that were available from care everywhere and other healthcare systems.  Past medical history, social, surgical and family history all reviewed in electronic medical record.  No pertanent information unless stated regarding to the chief complaint.   Review of Systems:  No headache, visual changes, nausea, vomiting, diarrhea, constipation, dizziness, abdominal pain, skin rash, fevers, chills, night sweats, weight loss, swollen lymph nodes, body aches, joint swelling, chest pain, shortness of breath, mood changes. POSITIVE muscle aches  Objective  Blood pressure 102/62, pulse 94, height 5\' 3"  (1.6 m), SpO2 96%.   General: No apparent distress alert and oriented x3 mood and affect  normal, dressed appropriately.  HEENT: Pupils equal, extraocular movements intact  Respiratory: Patient's speak in full sentences and does not appear short of breath  Cardiovascular: No lower extremity edema, non tender, no erythema  Right knee does have trace effusion noted.  Left knee does not have any significant swelling noted today.   Limited muscular skeletal ultrasound was performed and interpreted by Antoine Primas, M   Limited ultrasound of patient's right knee does show trace effusion noted.  Patient does have a hypoechoic mass noted that is consistent with a parameniscal cyst.  Postsurgical changes of the lateral meniscus as noted.  Mild to moderate arthritic changes noted to the knee. Impression:.  Meniscal cyst noted.  Underlying arthritis of the right  knee  Impression and Recommendations:     The above documentation has been reviewed and is accurate and complete [name], DO

## 2023-08-30 ENCOUNTER — Other Ambulatory Visit: Payer: Self-pay

## 2023-08-30 ENCOUNTER — Other Ambulatory Visit (HOSPITAL_COMMUNITY): Payer: Self-pay

## 2023-08-30 DIAGNOSIS — Z01411 Encounter for gynecological examination (general) (routine) with abnormal findings: Secondary | ICD-10-CM | POA: Diagnosis not present

## 2023-08-30 DIAGNOSIS — N952 Postmenopausal atrophic vaginitis: Secondary | ICD-10-CM | POA: Diagnosis not present

## 2023-08-30 MED ORDER — ESTRADIOL 10 MCG VA TABS
ORAL_TABLET | VAGINAL | 3 refills | Status: AC
  Filled 2023-08-30: qty 40, 84d supply, fill #0

## 2023-09-03 ENCOUNTER — Other Ambulatory Visit: Payer: Self-pay

## 2023-09-03 ENCOUNTER — Ambulatory Visit: Payer: BC Managed Care – PPO | Admitting: Family Medicine

## 2023-09-03 ENCOUNTER — Encounter: Payer: Self-pay | Admitting: Family Medicine

## 2023-09-03 VITALS — BP 102/62 | HR 94 | Ht 63.0 in

## 2023-09-03 DIAGNOSIS — M25561 Pain in right knee: Secondary | ICD-10-CM

## 2023-09-03 DIAGNOSIS — M23204 Derangement of unspecified medial meniscus due to old tear or injury, left knee: Secondary | ICD-10-CM | POA: Diagnosis not present

## 2023-09-03 NOTE — Assessment & Plan Note (Signed)
 Seems to be doing very well with conservative therapy.  No other changes at this time.

## 2023-09-03 NOTE — Assessment & Plan Note (Signed)
 Known arthritic changes but does have what appears to be a parameniscal cyst noted on the lateral aspect of the knee and I do think that this Patient's severe pain for 2 days. Believe the patient did have a rupture.  We discussed with patient that this can reaccumulate.  Do not feel that any surgical intervention is necessary or would be beneficial at this time.  Next type of problem would be a knee replacement which patient is too young and too active at the moment.  Will continue to increase activity slowly otherwise.  Open with me again in 2 to 3 months

## 2023-09-03 NOTE — Patient Instructions (Addendum)
 Good to see you. Return in 2 months.

## 2023-09-06 ENCOUNTER — Other Ambulatory Visit (HOSPITAL_COMMUNITY): Payer: Self-pay

## 2023-09-06 DIAGNOSIS — Z1231 Encounter for screening mammogram for malignant neoplasm of breast: Secondary | ICD-10-CM | POA: Diagnosis not present

## 2023-10-24 DIAGNOSIS — H903 Sensorineural hearing loss, bilateral: Secondary | ICD-10-CM | POA: Diagnosis not present

## 2023-10-24 DIAGNOSIS — Z885 Allergy status to narcotic agent status: Secondary | ICD-10-CM | POA: Diagnosis not present

## 2023-10-24 DIAGNOSIS — Z79899 Other long term (current) drug therapy: Secondary | ICD-10-CM | POA: Diagnosis not present

## 2023-10-24 DIAGNOSIS — H8102 Meniere's disease, left ear: Secondary | ICD-10-CM | POA: Diagnosis not present

## 2023-10-24 DIAGNOSIS — Z0110 Encounter for examination of ears and hearing without abnormal findings: Secondary | ICD-10-CM | POA: Diagnosis not present

## 2023-10-24 DIAGNOSIS — R490 Dysphonia: Secondary | ICD-10-CM | POA: Diagnosis not present

## 2023-11-05 ENCOUNTER — Other Ambulatory Visit: Payer: Self-pay

## 2023-11-05 ENCOUNTER — Encounter: Payer: Self-pay | Admitting: Family Medicine

## 2023-11-05 ENCOUNTER — Ambulatory Visit (INDEPENDENT_AMBULATORY_CARE_PROVIDER_SITE_OTHER): Admitting: Family Medicine

## 2023-11-05 VITALS — BP 102/60 | HR 80 | Ht 63.0 in

## 2023-11-05 DIAGNOSIS — M25562 Pain in left knee: Secondary | ICD-10-CM | POA: Diagnosis not present

## 2023-11-05 DIAGNOSIS — M25561 Pain in right knee: Secondary | ICD-10-CM

## 2023-11-05 DIAGNOSIS — G8929 Other chronic pain: Secondary | ICD-10-CM

## 2023-11-05 DIAGNOSIS — M19011 Primary osteoarthritis, right shoulder: Secondary | ICD-10-CM

## 2023-11-05 NOTE — Assessment & Plan Note (Signed)
 Much improved at this time.  No other changes in management.

## 2023-11-05 NOTE — Patient Instructions (Addendum)
 Good to see you. Return in 10 weeks.

## 2023-11-05 NOTE — Assessment & Plan Note (Signed)
 Injection in February.  No other changes at the moment.  Mild hypoechoic changes still noted.  Continuing to have difficulty may need to consider a different approach injection, advanced imaging or consider the possibility of surgical intervention but hopefully will not be necessary.  Follow-up again in 2 to 3 months.

## 2023-11-05 NOTE — Progress Notes (Signed)
 [name] [address] [place] [address] Phone: [phone] Subjective:   Sierra [name] am a scribe for Dr. Felipe Horton.   I'm seeing this patient by the request  of:  Panosh, Joaquim Muir, MD  CC: Knee pain  [id number]  Sierra [name] is a 67 y.o. female coming in with complaint of left knee and right shoulder. Patient states that it is a follow up today. Doing pretty good overall.        Past Medical History:  Diagnosis Date   Acute lateral meniscus tear of right knee 05/03/2011   Allergic urticaria 08/06/2007   Qualifier: Diagnosis of  By: Ena Harries MD, Viktoria Gray    History of Doppler ultrasound    leg negative  2010 right   History of Doppler ultrasound    right leg 2010 neg   Meniere's disease    on diuretic therapy   Normal nuclear stress test    Myoview  2006    PHLEBITIS, LOWER EXTREMITY 09/17/2008   Qualifier: Diagnosis of  By: Derick Fleeting, CMA, Cindy     Tracheobronchopathia-osteochondroplastica 02/2010   involvement of trachea per FOB   Past Surgical History:  Procedure Laterality Date   BRONCHOSCOPY  10/11   BUNIONECTOMY WITH WEIL OSTEOTOMY Right 12/11/2018   Procedure: Right lapidus and modified McBride; 2nd metatarsal Weil and hammertoe correction;  Surgeon: Amada Backer, MD;  Location: [facility];  Service: Orthopedics;  Laterality: Right;    DOPPLER ECHOCARDIOGRAPHY  2010   rt leg neg   KNEE ARTHROSCOPY  2003   right   KNEE ARTHROSCOPY  05/11/2011   Procedure: ARTHROSCOPY KNEE;  Surgeon: Genevie Kerns, MD;  Location: [facility];  Service: Orthopedics;  Laterality: Right;  with lateral meniscectomy   myoview   2006   stress- neg   SKIN GRAFT  1963   Social History   Socioeconomic History   Marital status: Married    Spouse name: Not on file   Number of children: Not on file   Years of education: Not on file   Highest education level: Not on file  Occupational History   Not on  file  Tobacco Use   Smoking status: Never   Smokeless tobacco: Never  Vaping Use   Vaping status: Never Used  Substance and Sexual Activity   Alcohol use: Yes    Alcohol/week: 0.0 standard drinks of alcohol    Comment: socially 1-2 a month   Drug use: No   Sexual activity: Yes    Birth control/protection: Post-menopausal  Other Topics Concern   Not on file  Social History Narrative   Regular Exercise- Yes   Occupation: Clinical research associate   HH of 2   3 Children   Social Drivers of Corporate investment banker Strain: Not on file  Food Insecurity: Low Risk  (10/24/2023)   Received from [facility]   Hunger Vital Sign    Worried About Running Out of Food in the Last Year: Never true    Ran Out of Food in the Last Year: Never true  Transportation Needs: No Transportation Needs (10/24/2023)   Received from [facility]    In the past 12 months, has lack of reliable transportation kept you from medical appointments, meetings, work or from getting things needed for daily living? : No  Physical Activity: Not on file  Stress: Not on file  Social Connections: Not on file   Allergies  Allergen  Reactions   Morphine Hives    N/V   Latex Rash   Epinephrine      Other Reaction(s): Unknown   Phosphoric Acid Hives   Tributyl Phosphate Hives   Family History  Problem Relation Age of Onset   Uterine cancer Mother        sister   Asthma Father    Hyperlipidemia Father    Macular degeneration Father    Allergies Father    Prostate cancer Father    Multiple sclerosis Sister    Uterine cancer Sister    Thyroid  cancer Daughter     Current Outpatient Medications (Endocrine & Metabolic):    predniSONE  (DELTASONE ) 20 MG tablet, Take 2 tablets (40 mg total) by mouth daily with breakfast.  Current Outpatient Medications (Cardiovascular):    triamterene-hydrochlorothiazide (MAXZIDE-25) 37.5-25 MG per tablet, Take 1 tablet by mouth once a week. Takes for Meniere's  disease   Current Outpatient Medications (Analgesics):    meloxicam  (MOBIC ) 15 MG tablet, TAKE 1 TABLET (15 MG TOTAL) BY MOUTH DAILY.   Current Outpatient Medications (Other):    Estradiol  (YUVAFEM ) 10 MCG TABS vaginal tablet, INSERT 1 TABLET INTO THE VAGINA EVERY NIGHT FOR 1 WEEK THEN CONTINUE WITH 1 TABLET THREE TIMES A WEEK. REPEAT THIS REGIMEN EVERY THREE MONTHS.   YUVAFEM  10 MCG TABS vaginal tablet, Place 1 tablet (10 mcg total) vaginally 2 (two) times a week.   Reviewed prior external information including notes and imaging from  primary care provider As well as notes that were available from care everywhere and other healthcare systems.  Past medical history, social, surgical and family history all reviewed in electronic medical record.  No pertanent information unless stated regarding to the chief complaint.   Review of Systems:  No headache, visual changes, nausea, vomiting, diarrhea, constipation, dizziness, abdominal pain, skin rash, fevers, chills, night sweats, weight loss, swollen lymph nodes, body aches, joint swelling, chest pain, shortness of breath, mood changes. POSITIVE muscle aches  Objective  There were no vitals taken for this visit.   General: No apparent distress alert and oriented x3 mood and affect normal, dressed appropriately.  HEENT: Pupils equal, extraocular movements intact  Respiratory: Patient's speak in full sentences and does not appear short of breath  Cardiovascular: No lower extremity edema, non tender, no erythema   Knee exam shows shows some mild crepitus noted.  Minimal tenderness over the medial joint line bilaterally. Right shoulder significant improvement in range of motion but still positive crossover noted.  Limited muscular skeletal ultrasound was performed and interpreted by Ronnell Coins, M   Limited ultrasound of the right shoulder still shows some acromioclavicular arthritis with some hypoechoic changes but significant improvement  from previous exam.   Impression and Recommendations:     The above documentation has been reviewed and is accurate and complete Sierra [name] M Benjamin Merrihew, DO

## 2023-12-09 DIAGNOSIS — J385 Laryngeal spasm: Secondary | ICD-10-CM | POA: Diagnosis not present

## 2023-12-09 DIAGNOSIS — R490 Dysphonia: Secondary | ICD-10-CM | POA: Diagnosis not present

## 2023-12-09 DIAGNOSIS — J3801 Paralysis of vocal cords and larynx, unilateral: Secondary | ICD-10-CM | POA: Diagnosis not present

## 2023-12-09 DIAGNOSIS — R498 Other voice and resonance disorders: Secondary | ICD-10-CM | POA: Diagnosis not present

## 2023-12-09 DIAGNOSIS — J383 Other diseases of vocal cords: Secondary | ICD-10-CM | POA: Diagnosis not present

## 2023-12-30 IMAGING — DX DG KNEE AP/LAT W/ SUNRISE*L*
3 series · 3 of 3 positions shown · non-contrast
Comparison: Left knee radiographs 09/19/2021

CLINICAL DATA: Intermittent left knee pain for 2 months. Worsening
after twisting knee injury recently.

EXAM:
LEFT KNEE 3 VIEWS

[knee ap]
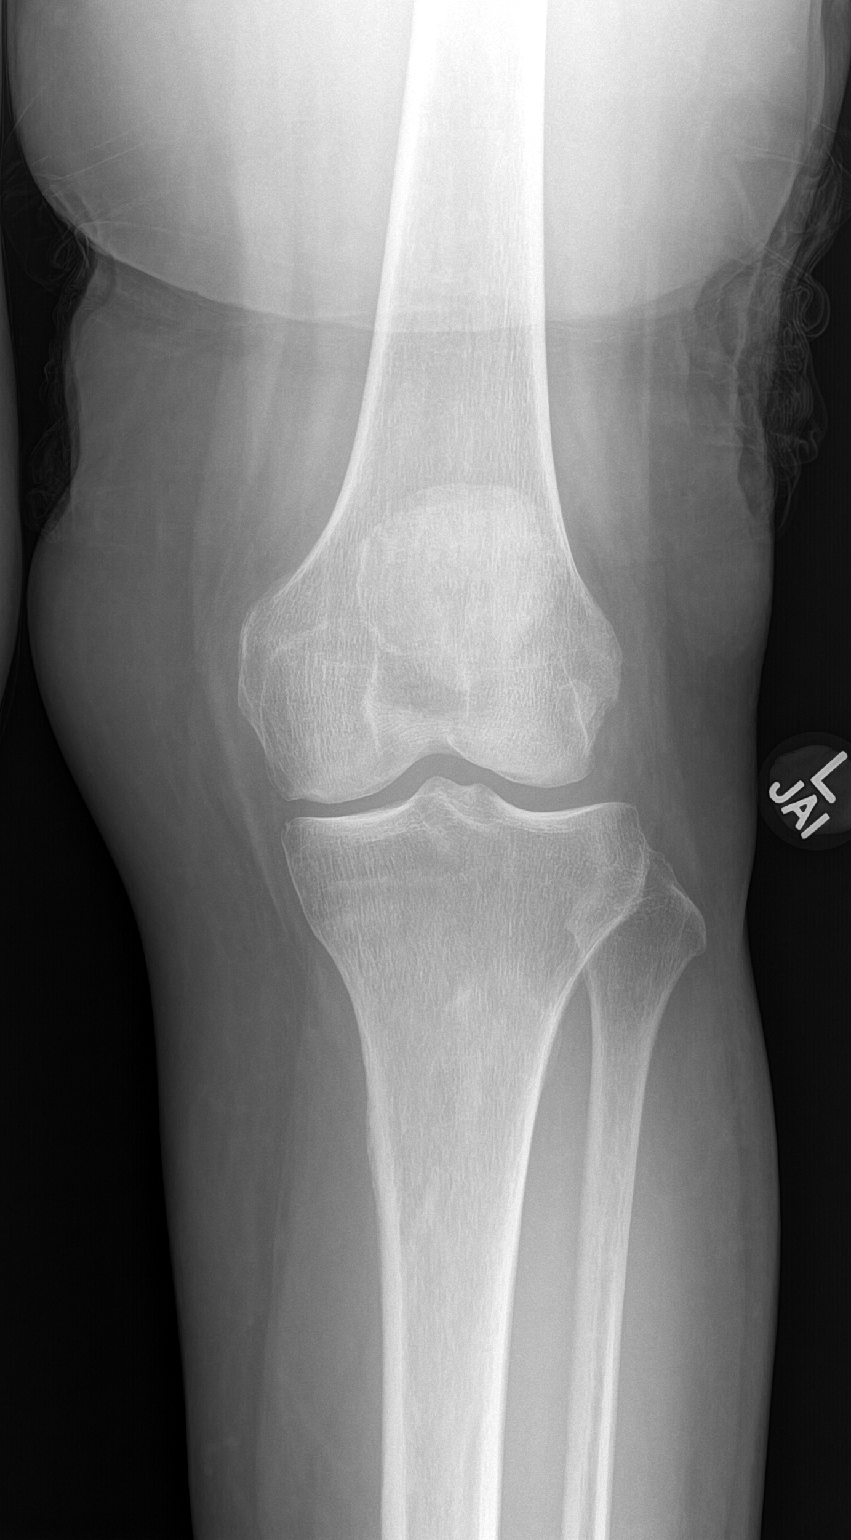

[knee lat]
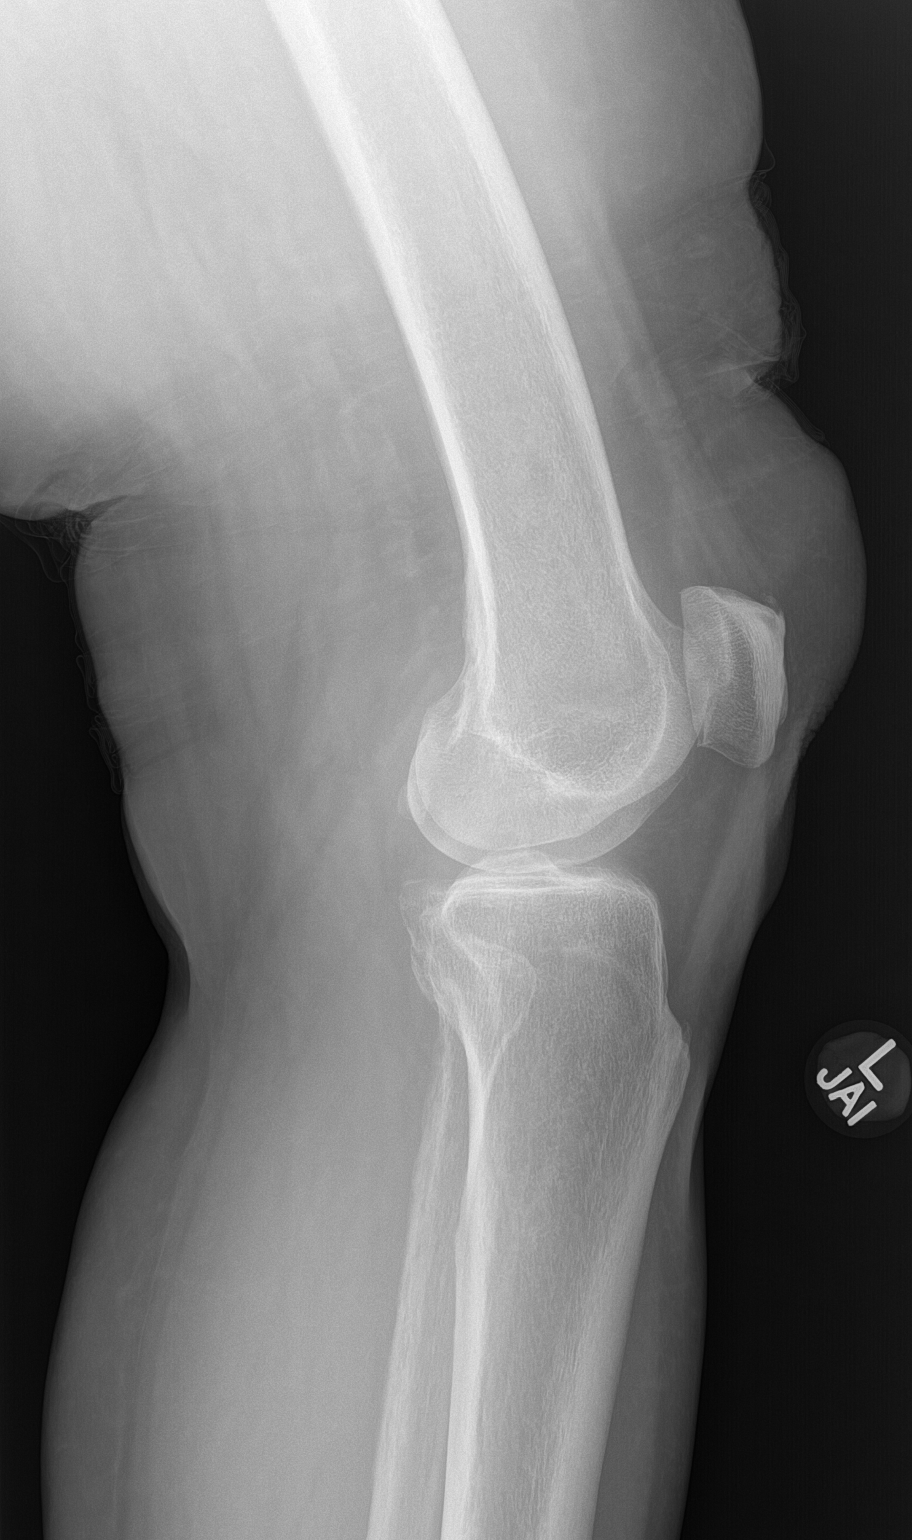

[patella]
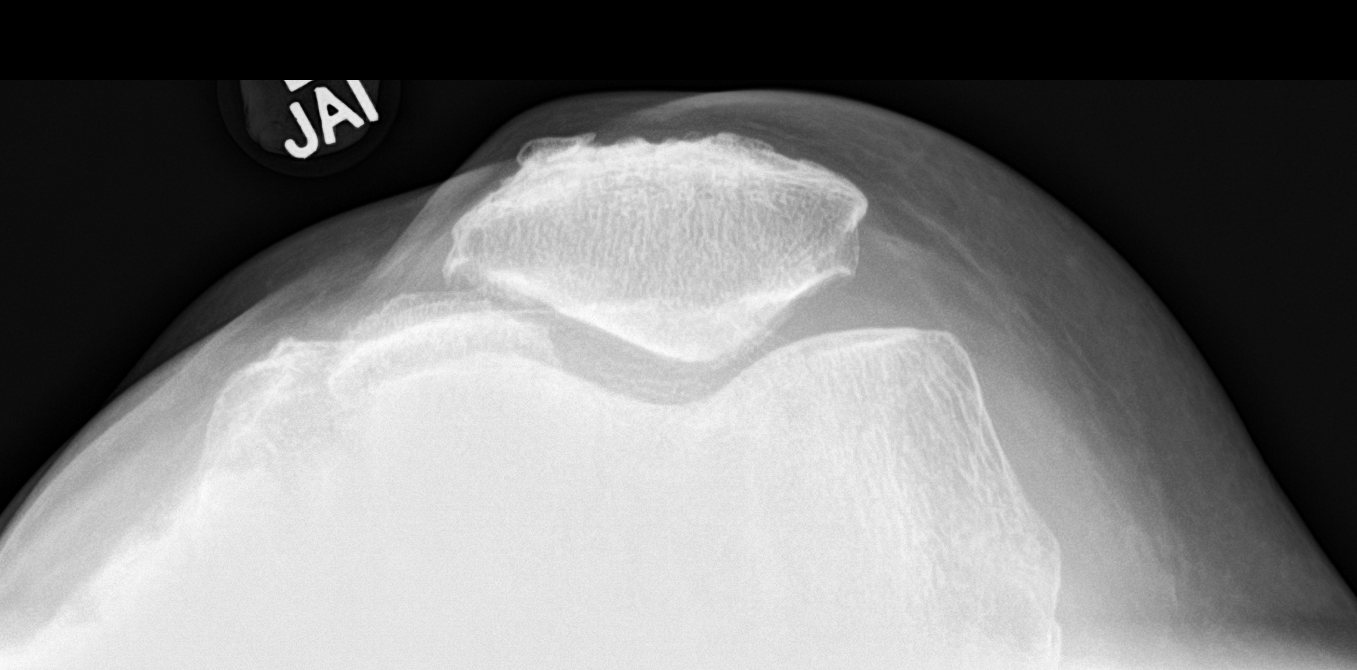

[3 of 3 positions shown; findings below may reference images not displayed]

FINDINGS: Minimal medial compartment joint space narrowing and peripheral
degenerative osteophytes. Mild peripheral lateral greater than
medial patellar degenerative osteophytes on sunrise view.

Minimal chronic enthesopathic change at the quadriceps insertion on
the patella. No joint effusion. New moderate soft tissue swelling
centered at the anterior knee at the superior aspect of the patella
to just superior to the patella.
IMPRESSION: 1. Minimal medial compartment and patellofemoral compartment
osteoarthritis.
2. New moderate anterior superior knee soft tissue swelling.

## 2024-01-21 ENCOUNTER — Ambulatory Visit: Admitting: Family Medicine

## 2024-01-30 NOTE — Progress Notes (Signed)
 [facility] [address] [place] [address] Phone: [phone] Subjective:   Sierra [name], am serving as a scribe for Dr. Arthea Claudene.  I'm seeing this patient by the request  of:  Panosh, Apolinar POUR, MD  CC: Knee pain, shoulder pain follow-up  YEP:Dlagzrupcz  11/05/2023 Injection in February.  No other changes at the moment.  Mild hypoechoic changes still noted.  Continuing to have difficulty may need to consider a different approach injection, advanced imaging or consider the possibility of surgical intervention but hopefully will not be necessary.  Follow-up again in 2 to 3 months.     Much improved at this time.  No other changes in management.     Updated 02/03/2024 Sierra [name] is a 68 y.o. female coming in with complaint of shoulder pain, knee pain.  Known to have acromioclavicular arthritis as well as left knee pain.  Patient states that her shoulder is better than where it was at but not 100%. Painful to press weights overhead.   Pain in R knee has improved. Had episode of sharp pain last week but it has dissipated. Being seated for most of her day will result in stiffness in R knee. Symptoms improve after she starts moving around.        Past Medical History:  Diagnosis Date   Acute lateral meniscus tear of right knee 05/03/2011   Allergic urticaria 08/06/2007   Qualifier: Diagnosis of  By: Krystal MD, Reyes LABOR    History of Doppler ultrasound    leg negative  2010 right   History of Doppler ultrasound    right leg 2010 neg   Meniere's disease    on diuretic therapy   Normal nuclear stress test    Myoview  2006    PHLEBITIS, LOWER EXTREMITY 09/17/2008   Qualifier: Diagnosis of  By: Delos, CMA, Cindy     Tracheobronchopathia-osteochondroplastica 02/2010   involvement of trachea per FOB   Past Surgical History:  Procedure Laterality Date   BRONCHOSCOPY  10/11   BUNIONECTOMY WITH WEIL OSTEOTOMY Right 12/11/2018   Procedure:  Right lapidus and modified McBride; 2nd metatarsal Weil and hammertoe correction;  Surgeon: Kit Rush, MD;  Location: [facility];  Service: Orthopedics;  Laterality: Right;    DOPPLER ECHOCARDIOGRAPHY  2010   rt leg neg   KNEE ARTHROSCOPY  2003   right   KNEE ARTHROSCOPY  05/11/2011   Procedure: ARTHROSCOPY KNEE;  Surgeon: Lamar LABOR Millman, MD;  Location: [facility];  Service: Orthopedics;  Laterality: Right;  with lateral meniscectomy   myoview   2006   stress- neg   SKIN GRAFT  1963   Social History   Socioeconomic History   Marital status: Married    Spouse name: Not on file   Number of children: Not on file   Years of education: Not on file   Highest education level: Not on file  Occupational History   Not on file  Tobacco Use   Smoking status: Never   Smokeless tobacco: Never  Vaping Use   Vaping status: Never Used  Substance and Sexual Activity   Alcohol use: Yes    Alcohol/week: 0.0 standard drinks of alcohol    Comment: socially 1-2 a month   Drug use: No   Sexual activity: Yes    Birth control/protection: Post-menopausal  Other Topics Concern   Not on file  Social History Narrative   Regular Exercise- Yes   Occupation:  Lawyer   HH of 2   3 Children   Social Drivers of Corporate investment banker Strain: Not on file  Food Insecurity: Low Risk  (10/24/2023)   Received from [facility]   Hunger Vital Sign    Within the past 12 months, you worried that your food would run out before you got money to buy more: Never true    Within the past 12 months, the food you bought just didn't last and you didn't have money to get more. : Never true  Transportation Needs: No Transportation Needs (10/24/2023)   Received from [facility]    In the past 12 months, has lack of reliable transportation kept you from medical appointments, meetings, work or from getting things needed for daily living? : No  Physical  Activity: Not on file  Stress: Not on file  Social Connections: Not on file   Allergies  Allergen Reactions   Morphine Hives    N/V   Latex Rash   Epinephrine      Other Reaction(s): Unknown   Phosphoric Acid Hives   Tributyl Phosphate Hives   Family History  Problem Relation Age of Onset   Uterine cancer Mother        sister   Asthma Father    Hyperlipidemia Father    Macular degeneration Father    Allergies Father    Prostate cancer Father    Multiple sclerosis Sister    Uterine cancer Sister    Thyroid  cancer Daughter     Current Outpatient Medications (Endocrine & Metabolic):    predniSONE  (DELTASONE ) 20 MG tablet, Take 2 tablets (40 mg total) by mouth daily with breakfast.  Current Outpatient Medications (Cardiovascular):    triamterene-hydrochlorothiazide (MAXZIDE-25) 37.5-25 MG per tablet, Take 1 tablet by mouth once a week. Takes for Meniere's disease   Current Outpatient Medications (Analgesics):    meloxicam  (MOBIC ) 15 MG tablet, TAKE 1 TABLET (15 MG TOTAL) BY MOUTH DAILY.   Current Outpatient Medications (Other):    Estradiol  (YUVAFEM ) 10 MCG TABS vaginal tablet, INSERT 1 TABLET INTO THE VAGINA EVERY NIGHT FOR 1 WEEK THEN CONTINUE WITH 1 TABLET THREE TIMES A WEEK. REPEAT THIS REGIMEN EVERY THREE MONTHS.   YUVAFEM  10 MCG TABS vaginal tablet, Place 1 tablet (10 mcg total) vaginally 2 (two) times a week.   Reviewed prior external information including notes and imaging from  primary care provider As well as notes that were available from care everywhere and other healthcare systems.  Past medical history, social, surgical and family history all reviewed in electronic medical record.  No pertanent information unless stated regarding to the chief complaint.   Review of Systems:  No headache, visual changes, nausea, vomiting, diarrhea, constipation, dizziness, abdominal pain, skin rash, fevers, chills, night sweats, weight loss, swollen lymph nodes, body aches,  joint swelling, chest pain, shortness of breath, mood changes. POSITIVE muscle aches  Objective  Blood pressure 110/84, height 5' 3 (1.6 m), SpO2 97%.   General: No apparent distress alert and oriented x3 mood and affect normal, dressed appropriately.  HEENT: Pupils equal, extraocular movements intact  Respiratory: Patient's speak in full sentences and does not appear short of breath  Cardiovascular: No lower extremity edema, non tender, no erythema  Knee exam shows significant tenderness bilaterally.  Trace effusion of the knees bilaterally.  Shoulder exam shows positive crossover noted.  Tenderness to palpation noted.  Positive crossover noted.  Tender over the acromioclavicular joint.  Procedure: Real-time Ultrasound  Guided Injection of right acromioclavicular joint Device: GE Logiq Q7 Ultrasound guided injection is preferred based studies that show increased duration, increased effect, greater accuracy, decreased procedural pain, increased response rate, and decreased cost with ultrasound guided versus blind injection.  Verbal informed consent obtained.  Time-out conducted.  Noted no overlying erythema, induration, or other signs of local infection.  Skin prepped in a sterile fashion.  Local anesthesia: Topical Ethyl chloride.  With sterile technique and under real time ultrasound guidance: With a 25-gauge half inch needle injected with 0.5 cc of 0.5% Marcaine  and 0.5 cc of Kenalog  40 mg/mL Completed without difficulty  Pain immediately resolved suggesting accurate placement of the medication.  Advised to call if fevers/chills, erythema, induration, drainage, or persistent bleeding.  Images saved Impression: Technically successful ultrasound guided injection.    Impression and Recommendations:    The above documentation has been reviewed and is accurate and complete Shahram Alexopoulos M Cordarro Spinnato, DO

## 2024-02-03 ENCOUNTER — Other Ambulatory Visit: Payer: Self-pay

## 2024-02-03 ENCOUNTER — Ambulatory Visit: Admitting: Family Medicine

## 2024-02-03 ENCOUNTER — Encounter: Payer: Self-pay | Admitting: Family Medicine

## 2024-02-03 VITALS — BP 110/84 | Ht 63.0 in

## 2024-02-03 DIAGNOSIS — M25561 Pain in right knee: Secondary | ICD-10-CM | POA: Diagnosis not present

## 2024-02-03 DIAGNOSIS — M19011 Primary osteoarthritis, right shoulder: Secondary | ICD-10-CM | POA: Diagnosis not present

## 2024-02-03 DIAGNOSIS — M25511 Pain in right shoulder: Secondary | ICD-10-CM | POA: Diagnosis not present

## 2024-02-03 NOTE — Patient Instructions (Addendum)
 Injected L AC joint Ice after activity We will watch the knees  See me in 3 months

## 2024-02-03 NOTE — Assessment & Plan Note (Signed)
 Bilateral injections given today, tolerated the procedure well, discussed icing regimen and home exercises, as do believe that this is the contributing factor.  Has had significant amount of inflammation.  Would need to consider potential surgical intervention for this at some point but has responded to the injections relatively regularly.  Follow-up with me again in 6 to 8 weeks otherwise.

## 2024-02-03 NOTE — Assessment & Plan Note (Signed)
 Stable at the moment.  Hold on any injection but will be traveling regularly in the near future.  Large trip for 3 weeks in November.  Follow-up again at that time before the trip to see how she is responding.

## 2024-02-04 ENCOUNTER — Other Ambulatory Visit: Payer: Self-pay | Admitting: Internal Medicine

## 2024-02-04 ENCOUNTER — Encounter: Payer: Self-pay | Admitting: Internal Medicine

## 2024-02-04 ENCOUNTER — Telehealth (INDEPENDENT_AMBULATORY_CARE_PROVIDER_SITE_OTHER): Admitting: Internal Medicine

## 2024-02-04 DIAGNOSIS — T148XXA Other injury of unspecified body region, initial encounter: Secondary | ICD-10-CM

## 2024-02-04 DIAGNOSIS — E2839 Other primary ovarian failure: Secondary | ICD-10-CM

## 2024-02-04 DIAGNOSIS — R739 Hyperglycemia, unspecified: Secondary | ICD-10-CM

## 2024-02-04 DIAGNOSIS — E7800 Pure hypercholesterolemia, unspecified: Secondary | ICD-10-CM | POA: Diagnosis not present

## 2024-02-04 DIAGNOSIS — Z7184 Encounter for health counseling related to travel: Secondary | ICD-10-CM

## 2024-02-04 DIAGNOSIS — Z79899 Other long term (current) drug therapy: Secondary | ICD-10-CM

## 2024-02-04 MED ORDER — COVID-19MRNA BIVAL VACC PFIZER 30 MCG/0.3ML IM SUSP: 0.3000 mL | Freq: Once | INTRAMUSCULAR | 0 refills | Status: AC

## 2024-02-04 NOTE — Progress Notes (Unsigned)
 Virtual Visit via Video Note  I connected with Sierra [name] on 02/07/24 at  8:30 AM EDT by a video enabled telemedicine application and verified that I am speaking with the correct person using two identifiers. Location patient: home Location provider:work office Persons participating in the virtual visit: patient, provider   Patient aware  of the limitations of evaluation and management by telemedicine and  availability of in person appointments. and agreed to proceed.   HPI: Sierra [name] presents for video visit    Concern Over a month of  bruise  round and over a month  and wonders  if  medically important  no other bleeding no asa no trauma remembered . Generally well.  Planning  trip to [place]  early November  some to remote area [place] and will need  prophylaxis and update immunizations  ? Order Dexa due none recently   no fx Interested in talking with dietician nutrition  to optimize healthy eating weight   see lipids .   ROS: See pertinent positives and negatives per HPI.  Past Medical History:  Diagnosis Date   Acute lateral meniscus tear of right knee 05/03/2011   Allergic urticaria 08/06/2007   Qualifier: Diagnosis of  By: Krystal MD, Reyes LABOR    History of Doppler ultrasound    leg negative  2010 right   History of Doppler ultrasound    right leg 2010 neg   Meniere's disease    on diuretic therapy   Normal nuclear stress test    Myoview  2006    PHLEBITIS, LOWER EXTREMITY 09/17/2008   Qualifier: Diagnosis of  By: Delos, CMA, Cindy     Tracheobronchopathia-osteochondroplastica 02/2010   involvement of trachea per FOB    Past Surgical History:  Procedure Laterality Date   BRONCHOSCOPY  10/11   BUNIONECTOMY WITH WEIL OSTEOTOMY Right 12/11/2018   Procedure: Right lapidus and modified McBride; 2nd metatarsal Weil and hammertoe correction;  Surgeon: Kit Rush, MD;  Location: [facility];  Service: Orthopedics;  Laterality: Right;     DOPPLER ECHOCARDIOGRAPHY  2010   rt leg neg   KNEE ARTHROSCOPY  2003   right   KNEE ARTHROSCOPY  05/11/2011   Procedure: ARTHROSCOPY KNEE;  Surgeon: Lamar LABOR Millman, MD;  Location: [facility];  Service: Orthopedics;  Laterality: Right;  with lateral meniscectomy   myoview   2006   stress- neg   SKIN GRAFT  1963    Family History  Problem Relation Age of Onset   Uterine cancer Mother        sister   Asthma Father    Hyperlipidemia Father    Macular degeneration Father    Allergies Father    Prostate cancer Father    Multiple sclerosis Sister    Uterine cancer Sister    Thyroid  cancer Daughter     Social History   Tobacco Use   Smoking status: Never   Smokeless tobacco: Never  Vaping Use   Vaping status: Never Used  Substance Use Topics   Alcohol use: Yes    Alcohol/week: 0.0 standard drinks of alcohol    Comment: socially 1-2 a month   Drug use: No      Current Outpatient Medications:    Estradiol  (YUVAFEM ) 10 MCG TABS vaginal tablet, INSERT 1 TABLET INTO THE VAGINA EVERY NIGHT FOR 1 WEEK THEN CONTINUE WITH 1 TABLET THREE TIMES A WEEK. REPEAT THIS REGIMEN EVERY THREE MONTHS., Disp: 40 tablet, Rfl: 3  triamterene-hydrochlorothiazide (MAXZIDE-25) 37.5-25 MG per tablet, Take 1 tablet by mouth once a week. Takes for Meniere's disease, Disp: , Rfl:    meloxicam  (MOBIC ) 15 MG tablet, TAKE 1 TABLET (15 MG TOTAL) BY MOUTH DAILY. (Patient not taking: Reported on 02/04/2024), Disp: 30 tablet, Rfl: 0   predniSONE  (DELTASONE ) 20 MG tablet, Take 2 tablets (40 mg total) by mouth daily with breakfast. (Patient not taking: Reported on 02/04/2024), Disp: 10 tablet, Rfl: 0   YUVAFEM  10 MCG TABS vaginal tablet, Place 1 tablet (10 mcg total) vaginally 2 (two) times a week. (Patient not taking: Reported on 02/04/2024), Disp: 8 tablet, Rfl: 0  EXAM: BP Readings from Last 3 Encounters:  02/03/24 110/84  11/05/23 102/60  09/03/23 102/62    VITALS per patient if  applicable:  GENERAL: alert, oriented, appears well and in no acute distress  HEENT: atraumatic, conjunttiva clear, no obvious abnormalities on inspection of external nose and ears NECK: normal movements of the head and neck Left arm  fading round ovid  bruise left biceps area  no petechia and nl deformity  MS: moves all visible extremities without noticeable abnormality  PSYCH/NEURO: pleasant and cooperative, no obvious depression or anxiety, speech and thought processing grossly intact Lab Results  Component Value Date   WBC 5.1 02/07/2024   HGB 14.1 02/07/2024   HCT 42.5 02/07/2024   PLT 284.0 02/07/2024   GLUCOSE 101 (H) 02/07/2024   CHOL 239 (H) 02/07/2024   TRIG 58.0 02/07/2024   HDL 88.30 02/07/2024   LDLDIRECT 112.2 08/22/2012   LDLCALC 139 (H) 02/07/2024   ALT 23 02/07/2024   AST 22 02/07/2024   NA 141 02/07/2024   K 3.6 02/07/2024   CL 102 02/07/2024   CREATININE 0.74 02/07/2024   BUN 16 02/07/2024   CO2 30 02/07/2024   TSH 1.74 02/07/2024   INR 1.2 (H) 09/21/2010   HGBA1C 6.3 02/07/2024    ASSESSMENT AND PLAN:  Discussed the following assessment and plan:    ICD-10-CM   1. Bruise  T14.8XXA     2. Counseling about travel  Z71.84     3. Elevated LDL cholesterol level  E78.00 AMB Referral to Nutrition    4. Estrogen deficiency  E28.39    due for dexa  will order at [facility]. Ok to do referral for nutrition diet  lipids  bg etc concerna bout weight. Bruise area  doesn't look alrming today since  beginning to fade . But check labs  Update lab orders Order dexa and labs elam Check into travel prevention requirements and we can  order meds as indicated  Hep b hep a  malaria  Covid vaccine Other  send in message   malaria prophyl etc needed.  Can also consider checking with hdd travel   Expectant management and discussion of plan and treatment with opportunity to ask questions and all were answered. The patient agreed with the plan and  demonstrated an understanding of the instructions.   Advised to call back or seek an in-person evaluation if worsening  or having  further concerns  in interim. Return for when planned, as indicated.    Apolinar Eastern, MD

## 2024-02-07 ENCOUNTER — Other Ambulatory Visit

## 2024-02-07 DIAGNOSIS — E7800 Pure hypercholesterolemia, unspecified: Secondary | ICD-10-CM | POA: Diagnosis not present

## 2024-02-07 DIAGNOSIS — R739 Hyperglycemia, unspecified: Secondary | ICD-10-CM | POA: Diagnosis not present

## 2024-02-07 DIAGNOSIS — Z79899 Other long term (current) drug therapy: Secondary | ICD-10-CM

## 2024-02-07 DIAGNOSIS — T148XXA Other injury of unspecified body region, initial encounter: Secondary | ICD-10-CM

## 2024-02-07 LAB — CBC WITH DIFFERENTIAL/PLATELET
Basophils Absolute: 0 K/uL (ref 0.0–0.1)
Basophils Relative: 0.8 % (ref 0.0–3.0)
Eosinophils Absolute: 0.3 K/uL (ref 0.0–0.7)
Eosinophils Relative: 5.3 % — ABNORMAL HIGH (ref 0.0–5.0)
HCT: 42.5 % (ref 36.0–46.0)
Hemoglobin: 14.1 g/dL (ref 12.0–15.0)
Lymphocytes Relative: 30.8 % (ref 12.0–46.0)
Lymphs Abs: 1.6 K/uL (ref 0.7–4.0)
MCHC: 33.1 g/dL (ref 30.0–36.0)
MCV: 89.3 fl (ref 78.0–100.0)
Monocytes Absolute: 0.5 K/uL (ref 0.1–1.0)
Monocytes Relative: 9.6 % (ref 3.0–12.0)
Neutro Abs: 2.7 K/uL (ref 1.4–7.7)
Neutrophils Relative %: 53.5 % (ref 43.0–77.0)
Platelets: 284 K/uL (ref 150.0–400.0)
RBC: 4.76 Mil/uL (ref 3.87–5.11)
RDW: 13.7 % (ref 11.5–15.5)
WBC: 5.1 K/uL (ref 4.0–10.5)

## 2024-02-07 LAB — LIPID PANEL
Cholesterol: 239 mg/dL — ABNORMAL HIGH (ref 0–200)
HDL: 88.3 mg/dL (ref >39.00)
LDL Cholesterol: 139 mg/dL — ABNORMAL HIGH (ref 0–99)
NonHDL: 150.88
Total CHOL/HDL Ratio: 3
Triglycerides: 58 mg/dL (ref 0.0–149.0)
VLDL: 11.6 mg/dL (ref 0.0–40.0)

## 2024-02-07 LAB — BASIC METABOLIC PANEL WITH GFR
BUN: 16 mg/dL (ref 6–23)
CO2: 30 meq/L (ref 19–32)
Calcium: 9.8 mg/dL (ref 8.4–10.5)
Chloride: 102 meq/L (ref 96–112)
Creatinine, Ser: 0.74 mg/dL (ref 0.40–1.20)
GFR: 82.93 mL/min (ref >60.00)
Glucose, Bld: 101 mg/dL — ABNORMAL HIGH (ref 70–99)
Potassium: 3.6 meq/L (ref 3.5–5.1)
Sodium: 141 meq/L (ref 135–145)

## 2024-02-07 LAB — HEPATIC FUNCTION PANEL
ALT: 23 U/L (ref 0–35)
AST: 22 U/L (ref 0–37)
Albumin: 4.4 g/dL (ref 3.5–5.2)
Alkaline Phosphatase: 67 U/L (ref 39–117)
Bilirubin, Direct: 0.2 mg/dL (ref 0.0–0.3)
Total Bilirubin: 0.6 mg/dL (ref 0.2–1.2)
Total Protein: 7.3 g/dL (ref 6.0–8.3)

## 2024-02-07 LAB — HEMOGLOBIN A1C: Hgb A1c MFr Bld: 6.3 % (ref 4.6–6.5)

## 2024-02-07 LAB — TSH: TSH: 1.74 u[IU]/mL (ref 0.35–5.50)

## 2024-02-10 ENCOUNTER — Ambulatory Visit: Payer: Self-pay | Admitting: Internal Medicine

## 2024-02-10 NOTE — Progress Notes (Signed)
 Blood count ok , kidney function nl  A1c is 6.3  prediabetic range  attention to healthy eating and exercise to continue.   Kidney function  thyroid  nl . The 10-year ASCVD risk score (Arnett DK, et al., 2019) is: 7.1%   Values used to calculate the score:     Age: 68 years     Clincally relevant sex: Female     Is Non-Hispanic African American: No     Diabetic: No     Tobacco smoker: No     Systolic Blood Pressure: 127 mmHg     Is BP treated: No     HDL Cholesterol: 88.3 mg/dL     Total Cholesterol: 239 mg/dL

## 2024-02-25 NOTE — Telephone Encounter (Signed)
 You can consider medication  ( ie statin) Risk score is in borderline  risk .  You can optimize diet and exercise  And we could also  check lipo protein A a risk factor (moslty hereditary  ).  We consider if not done ct calcium score and if high go on meds and if zero continue life style .     If you want you can consult again with cards about intervention options .   Maybe make a fu appt virtual ok  if you want to  review all of this . ? Can check out ascvd risk score on line   The 10-year ASCVD risk score (Arnett DK, et al., 2019) is: 7.1%   Values used to calculate the score:     Age: 68 years     Clincally relevant sex: Female     Is Non-Hispanic African American: No     Diabetic: No     Tobacco smoker: No     Systolic Blood Pressure: 127 mmHg     Is BP treated: No     HDL Cholesterol: 88.3 mg/dL     Total Cholesterol: 239 mg/dL

## 2024-03-19 NOTE — Progress Notes (Signed)
 [facility] [address] [place] [address] Phone: [phone] Subjective:   Sierra [name], am serving as a scribe for Dr. Arthea Sierra.  I'Sierra [name] seeing this patient by the request  of:  Panosh, Apolinar POUR, MD  CC: Knee and shoulder pain  YEP:Dlagzrupcz  02/03/2024 Stable at the moment.  Hold on any injection but will be traveling regularly in the near future.  Large trip for 3 weeks in November.  Follow-up again at that time before the trip to see how she is responding.     Bilateral injections given today, tolerated the procedure well, discussed icing regimen and home exercises, as do believe that this is the contributing factor.  Has had significant amount of inflammation.  Would need to consider potential surgical intervention for this at some point but has responded to the injections relatively regularly.  Follow-up with me again in 6 to 8 weeks otherwise.     Updated 03/24/2024 Sierra [name] is a 68 y.o. female coming in with complaint of knee and shoulder pain.  Was found to have AC arthropathy on the right side of the shoulder and given injection nearly 2 months ago. Her shoulder is sore but doing better.   Having L knee pain during trip to [place]. Took Advil and Tylenol  which was helpful. Working out and not having any problems. Pain was throughout the entire joint. Doing much better now.         Past Medical History:  Diagnosis Date   Acute lateral meniscus tear of right knee 05/03/2011   Allergic urticaria 08/06/2007   Qualifier: Diagnosis of  By: Krystal MD, Reyes LABOR    History of Doppler ultrasound    leg negative  2010 right   History of Doppler ultrasound    right leg 2010 neg   Meniere's disease    on diuretic therapy   Normal nuclear stress test    Myoview  2006    PHLEBITIS, LOWER EXTREMITY 09/17/2008   Qualifier: Diagnosis of  By: Delos, CMA, Cindy     Tracheobronchopathia-osteochondroplastica 02/2010   involvement of trachea  per FOB   Past Surgical History:  Procedure Laterality Date   BRONCHOSCOPY  10/11   BUNIONECTOMY WITH WEIL OSTEOTOMY Right 12/11/2018   Procedure: Right lapidus and modified McBride; 2nd metatarsal Weil and hammertoe correction;  Surgeon: Kit Rush, MD;  Location: [facility];  Service: Orthopedics;  Laterality: Right;    DOPPLER ECHOCARDIOGRAPHY  2010   rt leg neg   KNEE ARTHROSCOPY  2003   right   KNEE ARTHROSCOPY  05/11/2011   Procedure: ARTHROSCOPY KNEE;  Surgeon: Lamar LABOR Millman, MD;  Location: [facility];  Service: Orthopedics;  Laterality: Right;  with lateral meniscectomy   myoview   2006   stress- neg   SKIN GRAFT  1963   Social History   Socioeconomic History   Marital status: Married    Spouse name: Not on file   Number of children: Not on file   Years of education: Not on file   Highest education level: Not on file  Occupational History   Not on file  Tobacco Use   Smoking status: Never   Smokeless tobacco: Never  Vaping Use   Vaping status: Never Used  Substance and Sexual Activity   Alcohol use: Yes    Alcohol/week: 0.0 standard drinks of alcohol    Comment: socially 1-2 a month   Drug use: No   Sexual  activity: Yes    Birth control/protection: Post-menopausal  Other Topics Concern   Not on file  Social History Narrative   Regular Exercise- Yes   Occupation: Clinical Research Associate   HH of 2   3 Children   Social Drivers of Corporate Investment Banker Strain: Not on file  Food Insecurity: Low Risk  (10/24/2023)   Received from [facility]   Hunger Vital Sign    Within the past 12 months, you worried that your food would run out before you got money to buy more: Never true    Within the past 12 months, the food you bought just didn't last and you didn't have money to get more. : Never true  Transportation Needs: No Transportation Needs (10/24/2023)   Received from [facility]    In the past 12 months, has  lack of reliable transportation kept you from medical appointments, meetings, work or from getting things needed for daily living? : No  Physical Activity: Not on file  Stress: Not on file  Social Connections: Not on file   Allergies  Allergen Reactions   Morphine Hives    N/V   Latex Rash   Epinephrine      Other Reaction(s): Unknown   Phosphoric Acid Hives   Tributyl Phosphate Hives   Family History  Problem Relation Age of Onset   Uterine cancer Mother        sister   Asthma Father    Hyperlipidemia Father    Macular degeneration Father    Allergies Father    Prostate cancer Father    Multiple sclerosis Sister    Uterine cancer Sister    Thyroid  cancer Daughter     Current Outpatient Medications (Endocrine & Metabolic):    predniSONE  (DELTASONE ) 20 MG tablet, Take 1 tablet (20 mg total) by mouth daily with breakfast.  Current Outpatient Medications (Cardiovascular):    triamterene-hydrochlorothiazide (MAXZIDE-25) 37.5-25 MG per tablet, Take 1 tablet by mouth once a week. Takes for Meniere's disease   Current Outpatient Medications (Analgesics):    meloxicam  (MOBIC ) 15 MG tablet, TAKE 1 TABLET (15 MG TOTAL) BY MOUTH DAILY.   Current Outpatient Medications (Other):    azithromycin  (ZITHROMAX ) 500 MG tablet, Take 1 tablet (500 mg total) by mouth daily.   Estradiol  (YUVAFEM ) 10 MCG TABS vaginal tablet, INSERT 1 TABLET INTO THE VAGINA EVERY NIGHT FOR 1 WEEK THEN CONTINUE WITH 1 TABLET THREE TIMES A WEEK. REPEAT THIS REGIMEN EVERY THREE MONTHS.   YUVAFEM  10 MCG TABS vaginal tablet, Place 1 tablet (10 mcg total) vaginally 2 (two) times a week.   Reviewed prior external information including notes and imaging from  primary care provider As well as notes that were available from care everywhere and other healthcare systems.  Past medical history, social, surgical and family history all reviewed in electronic medical record.  No pertanent information unless stated regarding  to the chief complaint.   Review of Systems:  No headache, visual changes, nausea, vomiting, diarrhea, constipation, dizziness, abdominal pain, skin rash, fevers, chills, night sweats, weight loss, swollen lymph nodes, body aches, joint swelling, chest pain, shortness of breath, mood changes. POSITIVE muscle aches  Objective  Blood pressure 118/82, pulse 84, height 5' 3 (1.6 Sierra [name]), SpO2 97%.   General: No apparent distress alert and oriented x3 mood and affect normal, dressed appropriately.  HEENT: Pupils equal, extraocular movements intact  Respiratory: Patient's speak in full sentences and does not appear short of breath  Cardiovascular: No  lower extremity edema, non tender, no erythema  Left knee does have crepitus noted.  Some limited range of motion.  Tenderness to palpation over the patellofemoral joint.  Right knee seems to be fairly unremarkable.  Limited muscular skeletal ultrasound was performed and interpreted by Sierra [name], Sierra [name]  Limited ultrasound of patient's left knee does show significant hypoechoic changes consistent with a patellofemoral effusion. Impression: Effusion of the patellofemoral joint  After informed written and verbal consent, patient was seated on exam table. Left knee was prepped with alcohol swab and utilizing anterolateral approach, patient's left knee space was injected with 4:1  marcaine  0.5%: Depo-Medrol  40mg /dL. Patient tolerated the procedure well without immediate complications.   Impression and Recommendations:    The above documentation has been reviewed and is accurate and complete Sierra [name] Sierra [name] Patina Spanier, DO

## 2024-03-25 ENCOUNTER — Encounter: Payer: Self-pay | Admitting: Family Medicine

## 2024-03-25 ENCOUNTER — Other Ambulatory Visit: Payer: Self-pay

## 2024-03-25 ENCOUNTER — Ambulatory Visit: Admitting: Family Medicine

## 2024-03-25 VITALS — BP 118/82 | HR 84 | Ht 63.0 in

## 2024-03-25 DIAGNOSIS — M23204 Derangement of unspecified medial meniscus due to old tear or injury, left knee: Secondary | ICD-10-CM | POA: Diagnosis not present

## 2024-03-25 DIAGNOSIS — M25511 Pain in right shoulder: Secondary | ICD-10-CM | POA: Diagnosis not present

## 2024-03-25 DIAGNOSIS — M25562 Pain in left knee: Secondary | ICD-10-CM | POA: Diagnosis not present

## 2024-03-25 DIAGNOSIS — M25561 Pain in right knee: Secondary | ICD-10-CM

## 2024-03-25 DIAGNOSIS — M19011 Primary osteoarthritis, right shoulder: Secondary | ICD-10-CM

## 2024-03-25 MED ORDER — METHYLPREDNISOLONE ACETATE 40 MG/ML IJ SUSP
40.0000 mg | Freq: Once | INTRAMUSCULAR | Status: AC
  Administered 2024-03-25: 40 mg via INTRAMUSCULAR

## 2024-03-25 MED ORDER — PREDNISONE 20 MG PO TABS: 20.0000 mg | ORAL_TABLET | Freq: Every day | ORAL | 0 refills | Status: AC

## 2024-03-25 MED ORDER — AZITHROMYCIN 500 MG PO TABS: 500.0000 mg | ORAL_TABLET | Freq: Every day | ORAL | 0 refills | Status: AC

## 2024-03-25 NOTE — Patient Instructions (Signed)
 Prednisone  Zpak See me in 2 months

## 2024-03-25 NOTE — Assessment & Plan Note (Signed)
 Injection was given today.  Tolerated the procedure well, right side seems to be doing relatively well.  Hopeful this will make improvements in given prednisone  with patient traveling for 3 weeks.  Follow-up with me again in 6 to 8 weeks.

## 2024-03-25 NOTE — Assessment & Plan Note (Signed)
 Improved but still inflamed.

## 2024-04-22 ENCOUNTER — Ambulatory Visit: Admitting: Skilled Nursing Facility1

## 2024-04-28 DIAGNOSIS — L821 Other seborrheic keratosis: Secondary | ICD-10-CM | POA: Diagnosis not present

## 2024-04-28 DIAGNOSIS — D225 Melanocytic nevi of trunk: Secondary | ICD-10-CM | POA: Diagnosis not present

## 2024-04-28 DIAGNOSIS — Z85828 Personal history of other malignant neoplasm of skin: Secondary | ICD-10-CM | POA: Diagnosis not present

## 2024-04-28 DIAGNOSIS — L814 Other melanin hyperpigmentation: Secondary | ICD-10-CM | POA: Diagnosis not present

## 2024-04-29 ENCOUNTER — Encounter: Payer: Self-pay | Admitting: Skilled Nursing Facility1

## 2024-04-29 ENCOUNTER — Encounter: Admitting: Skilled Nursing Facility1

## 2024-04-29 DIAGNOSIS — E7800 Pure hypercholesterolemia, unspecified: Secondary | ICD-10-CM | POA: Diagnosis present

## 2024-04-29 NOTE — Progress Notes (Signed)
 Medical Nutrition Therapy  Appointment Start time:  7:41  Appointment End time:  9:05  Primary concerns today: weight loss  Referral diagnosis: [id number]   NUTRITION ASSESSMENT    Clinical Medical Hx: hyperlipidemia  Medications: see list Labs: cholesterol 239, LDL 139 Notable Signs/Symptoms: some knee tweaks   Lifestyle & Dietary Hx  Pt states she loves food and loves to cook. 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 Pt states she eats quality foods and avoids processed foods.  Pt states she has had 4 surgery's on her foot/ankle. Pt states she struggles with being as active as she would like due to fear of injuring her knee further from several injuries starting from a drop in college when cheerleading.   Estimated daily fluid intake:  oz Supplements: none reported  Sleep:  Stress / self-care: low stress level Current average weekly physical activity: 5 days a week 20 minutes weights   24-Hr Dietary Recall First Meal: yogurt + tahini granola (nuts, coconut, dried fruit) or protein shake  Snack: fruit Second Meal: 1 slice cranberry walnut bread + turkey and fresh cranberrys and fresh ginger + harissa mayo or egg salad Snack:  gummy candy Third Meal: chicken or pre-made meals from a company and kale salad + dried cherries and blue cheese and toasted almonds with an apple Snack: dark chocolate  Beverages: skim cappuccino, water, coconut milk unsweetened with coco flavored coffee, diet coke    NUTRITION INTERVENTION  Nutrition education (E-1) on the following topics:  Creation of balanced and diverse meals to increase the intake of nutrient-rich foods that provide essential vitamins, minerals, fiber, and phytonutrients Variety of Fruits and Vegetables: Aim for a colorful array of fruits and vegetables to ensure a wide range of nutrients. Include a mix of leafy greens, berries, citrus fruits, cruciferous vegetables, and more. Whole Grains: Choose whole grains over refined  grains. Examples include brown rice, quinoa, oats, whole wheat, and barley. Lean Proteins: Include lean sources of protein, such as poultry, fish, tofu, legumes, beans, lentils, and low-fat dairy products. Limit red and processed meats. Healthy Fats: Incorporate sources of healthy fats, including avocados, nuts, seeds, and olive oil. Limit saturated and trans fats found in fried and processed foods. Dairy or Dairy Alternatives: Choose low-fat or fat-free dairy products, or plant-based alternatives like almond or soy milk. Portion Control: Be mindful of portion sizes to avoid overeating. Pay attention to hunger and satisfaction cues. Limit Added Sugars: Minimize the consumption of sugary beverages, snacks, and desserts. Check food labels for added sugars and opt for natural sources of sweetness such as whole fruits. Hydration: Drink plenty of water throughout the day. Limit sugary drinks and excessive caffeine intake. Moderate Sodium Intake: Reduce the consumption of high-sodium foods. Use herbs and spices for flavor instead of excessive salt. Meal Planning and Preparation: Plan and prepare meals ahead of time to make healthier choices more convenient. Include a mix of food groups in each meal. Limit Processed Foods: Minimize the intake of highly processed and packaged foods that are often high in added sugars, salt, and unhealthy fats. Regular Physical Activity: Combine a healthy diet with regular physical activity for overall well-being. Aim for at least 150 minutes of moderate-intensity aerobic exercise per week, along with strength training. Moderation and Balance: Enjoy treats and indulgent foods in moderation, emphasizing balance rather than strict restriction.   Learning Style & Readiness for Change Teaching method utilized: Visual & Auditory  Demonstrated degree of understanding via: Teach Back  Barriers to learning/adherence to lifestyle change: unidentified   Goals  Established by Pt Aim to do cardio 1 hour 5-6 days a week such as a walk int he forest or stationary bike    MONITORING & EVALUATION Dietary intake, weekly physical activity  Next Steps  Patient is to call back to make an appt for 2 months from now.

## 2024-05-19 NOTE — Progress Notes (Signed)
 " [name] [address] [place] [address] Phone: [phone] Subjective:   Sierra [name], am serving as a scribe for Dr. Arthea Sierra.  I'm seeing this patient by the request  of:  Panosh, Apolinar POUR, MD  CC:   YEP:Sierra [name]  03/25/2024 Improved but still inflamed.      Injection was given today.  Tolerated the procedure well, right side seems to be doing relatively well.  Hopeful this will make improvements in given prednisone  with patient traveling for 3 weeks.  Follow-up with me again in 6 to 8 weeks.     Updated 05/25/2024 Sierra [name] is a 68 y.o. female coming in with complaint of R shoulder and L knee pain. Patient states that her knee and shoulder pain has increased again. Both joints felt well during her trip when walking. Did bike with the seat lower than usually which aggravated her knee.   Also c/o pain in L medial malleolus. Ankle feels like it is going to give out on her.         Past Medical History:  Diagnosis Date   Acute lateral meniscus tear of right knee 05/03/2011   Allergic urticaria 08/06/2007   Qualifier: Diagnosis of  By: Krystal MD, Reyes LABOR    History of Doppler ultrasound    leg negative  2010 right   History of Doppler ultrasound    right leg 2010 neg   Meniere's disease    on diuretic therapy   Normal nuclear stress test    Myoview  2006    PHLEBITIS, LOWER EXTREMITY 09/17/2008   Qualifier: Diagnosis of  By: Delos, CMA, Cindy     Tracheobronchopathia-osteochondroplastica 02/2010   involvement of trachea per FOB   Past Surgical History:  Procedure Laterality Date   BRONCHOSCOPY  10/11   BUNIONECTOMY WITH WEIL OSTEOTOMY Right 12/11/2018   Procedure: Right lapidus and modified McBride; 2nd metatarsal Weil and hammertoe correction;  Surgeon: Kit Rush, MD;  Location: [facility];  Service: Orthopedics;  Laterality: Right;    DOPPLER ECHOCARDIOGRAPHY  2010   rt leg neg   KNEE  ARTHROSCOPY  2003   right   KNEE ARTHROSCOPY  05/11/2011   Procedure: ARTHROSCOPY KNEE;  Surgeon: Lamar LABOR Millman, MD;  Location: [facility];  Service: Orthopedics;  Laterality: Right;  with lateral meniscectomy   myoview   2006   stress- neg   SKIN GRAFT  1963   Social History   Socioeconomic History   Marital status: Married    Spouse name: Not on file   Number of children: Not on file   Years of education: Not on file   Highest education level: Not on file  Occupational History   Not on file  Tobacco Use   Smoking status: Never   Smokeless tobacco: Never  Vaping Use   Vaping status: Never Used  Substance and Sexual Activity   Alcohol use: Yes    Alcohol/week: 0.0 standard drinks of alcohol    Comment: socially 1-2 a month   Drug use: No   Sexual activity: Yes    Birth control/protection: Post-menopausal  Other Topics Concern   Not on file  Social History Narrative   Regular Exercise- Yes   Occupation: Lawyer   HH of 2   3 Children   Social Drivers of Health   Tobacco Use: Low Risk (04/29/2024)   Patient History    Smoking Tobacco Use: Never  Smokeless Tobacco Use: Never    Passive Exposure: Not on file  Financial Resource Strain: Not on file  Food Insecurity: Low Risk (10/24/2023)   Received from [facility]   Epic    Within the past 12 months, you worried that your food would run out before you got money to buy more: Never true    Within the past 12 months, the food you bought just didn't last and you didn't have money to get more. : Never true  Transportation Needs: No Transportation Needs (10/24/2023)   Received from [facility]    In the past 12 months, has lack of reliable transportation kept you from medical appointments, meetings, work or from getting things needed for daily living? : No  Physical Activity: Not on file  Stress: Not on file  Social Connections: Not on file  Depression (PHQ2-9): Low Risk (02/04/2024)    Depression (PHQ2-9)    PHQ-2 Score: 0  Alcohol Screen: Not on file  Housing: Low Risk (10/24/2023)   Received from [facility]   Epic    What is your living situation today?: I have a steady place to live    Think about the place you live. Do you have problems with any of the following? Choose all that apply:: None/None on this list  Utilities: Low Risk (10/24/2023)   Received from [facility]   Utilities    In the past 12 months has the electric, gas, oil, or water company threatened to shut off services in your home? : No  Health Literacy: Not on file   Allergies[1] Family History  Problem Relation Age of Onset   Uterine cancer Mother        sister   Asthma Father    Hyperlipidemia Father    Macular degeneration Father    Allergies Father    Prostate cancer Father    Multiple sclerosis Sister    Uterine cancer Sister    Thyroid  cancer Daughter     Current Outpatient Medications (Endocrine & Metabolic):    predniSONE  (DELTASONE ) 20 MG tablet, Take 1 tablet (20 mg total) by mouth daily with breakfast.  Current Outpatient Medications (Cardiovascular):    triamterene-hydrochlorothiazide (MAXZIDE-25) 37.5-25 MG per tablet, Take 1 tablet by mouth once a week. Takes for Meniere's disease  Current Outpatient Medications (Analgesics):    meloxicam  (MOBIC ) 15 MG tablet, TAKE 1 TABLET (15 MG TOTAL) BY MOUTH DAILY.  Current Outpatient Medications (Other):    azithromycin  (ZITHROMAX ) 500 MG tablet, Take 1 tablet (500 mg total) by mouth daily.   Estradiol  (YUVAFEM ) 10 MCG TABS vaginal tablet, INSERT 1 TABLET INTO THE VAGINA EVERY NIGHT FOR 1 WEEK THEN CONTINUE WITH 1 TABLET THREE TIMES A WEEK. REPEAT THIS REGIMEN EVERY THREE MONTHS.   YUVAFEM  10 MCG TABS vaginal tablet, Place 1 tablet (10 mcg total) vaginally 2 (two) times a week.   Reviewed prior external information including notes and imaging from  primary care provider As well as notes that were available from care everywhere  and other healthcare systems.  Past medical history, social, surgical and family history all reviewed in electronic medical record.  No pertanent information unless stated regarding to the chief complaint.   Review of Systems:  No headache, visual changes, nausea, vomiting, diarrhea, constipation, dizziness, abdominal pain, skin rash, fevers, chills, night sweats, weight loss, swollen lymph nodes, body aches, joint swelling, chest pain, shortness of breath, mood changes. POSITIVE muscle aches  Objective  Blood pressure 110/78, pulse  72, height 5' 3 (1.6 m), SpO2 97%.   General: No apparent distress alert and oriented x3 mood and affect normal, dressed appropriately.  HEENT: Pupils equal, extraocular movements intact  Respiratory: Patient's speak in full sentences and does not appear short of breath  Cardiovascular: No lower extremity edema, non tender, no erythema  Left foot exam shows no significant abnormality.  Patient is tender to palpation over the medial aspect of the plantar fascia and a little bit more on the medial aspect of the foot.  Difficult to associate any type of mass in the area.  Left knee still has arthritic changes noted mostly in the patellofemoral joint with positive crepitus  Left shoulder and right shoulder mild tenderness over the acromioclavicular joint with positive crossover  Limited muscular skeletal ultrasound was performed and interpreted by Sierra [name], M  Limited ultrasound of patient's left foot on the medial aspect does have what appears to be a cyst noted.  Fairly significant in size.  Noncompressible.  Avascular.  Consistent with potential fibroma.   Impression and Recommendations:     The above documentation has been reviewed and is accurate and complete [name] CHRISTELLA Claudene, DO       [1]  Allergies Allergen Reactions   Morphine Hives    N/V   Latex Rash   Epinephrine      Other Reaction(s): Unknown   Phosphoric Acid Hives   Tributyl  Phosphate Hives   "

## 2024-05-25 ENCOUNTER — Ambulatory Visit: Admitting: Family Medicine

## 2024-05-25 ENCOUNTER — Encounter: Payer: Self-pay | Admitting: Family Medicine

## 2024-05-25 ENCOUNTER — Other Ambulatory Visit: Payer: Self-pay

## 2024-05-25 VITALS — BP 110/78 | HR 72 | Ht 63.0 in

## 2024-05-25 DIAGNOSIS — M25511 Pain in right shoulder: Secondary | ICD-10-CM

## 2024-05-25 DIAGNOSIS — M19011 Primary osteoarthritis, right shoulder: Secondary | ICD-10-CM | POA: Diagnosis not present

## 2024-05-25 DIAGNOSIS — D2122 Benign neoplasm of connective and other soft tissue of left lower limb, including hip: Secondary | ICD-10-CM

## 2024-05-25 DIAGNOSIS — M25562 Pain in left knee: Secondary | ICD-10-CM

## 2024-05-25 NOTE — Assessment & Plan Note (Signed)
 Appears to have likely a fibroma of the foot.  We discussed with patient about different treatment options.  Patient is going to wear good shoes at this moment.  Discussed icing regimen and home exercises, increase activity slowly.  Follow-up again in 6 to 8 weeks.  Would like to ultrasound again to see how it is stable.  There was no abnormal blood flow.  May need injection.

## 2024-05-25 NOTE — Assessment & Plan Note (Signed)
 Patient was avoid any type of injection at the moment.  Discussed icing regimen and home exercises, nothing that seems to be fairly significant.  Follow-up again in 6 to 8 weeks worsening pain will consider injection.

## 2024-05-25 NOTE — Assessment & Plan Note (Signed)
 Known arthritis.  Continue to monitor

## 2024-07-28 ENCOUNTER — Ambulatory Visit: Admitting: Family Medicine
